# Patient Record
Sex: Female | Born: 1970 | Race: Black or African American | Hispanic: No | Marital: Single | Smoking: Current every day smoker
Health system: Southern US, Community
[De-identification: ages and names within clinical notes are randomized; demographics above are authoritative.]

## PROBLEM LIST (undated history)

## (undated) DIAGNOSIS — D649 Anemia, unspecified: Secondary | ICD-10-CM

## (undated) HISTORY — DX: Anemia, unspecified: D64.9

## (undated) HISTORY — PX: NO PAST SURGERIES: SHX2092

## (undated) HISTORY — PX: FOOT SURGERY: SHX648

---

## 2008-01-21 ENCOUNTER — Emergency Department (HOSPITAL_COMMUNITY): Admission: EM | Admit: 2008-01-21 | Discharge: 2008-01-22 | Payer: Self-pay | Admitting: Emergency Medicine

## 2008-07-13 ENCOUNTER — Emergency Department (HOSPITAL_COMMUNITY): Admission: EM | Admit: 2008-07-13 | Discharge: 2008-07-13 | Payer: Self-pay | Admitting: Emergency Medicine

## 2008-08-08 ENCOUNTER — Emergency Department (HOSPITAL_COMMUNITY): Admission: EM | Admit: 2008-08-08 | Discharge: 2008-08-08 | Payer: Self-pay | Admitting: Emergency Medicine

## 2008-08-14 ENCOUNTER — Emergency Department (HOSPITAL_COMMUNITY): Admission: EM | Admit: 2008-08-14 | Discharge: 2008-08-14 | Payer: Self-pay | Admitting: Emergency Medicine

## 2008-09-13 ENCOUNTER — Inpatient Hospital Stay (HOSPITAL_COMMUNITY): Admission: EM | Admit: 2008-09-13 | Discharge: 2008-09-17 | Payer: Self-pay | Admitting: Emergency Medicine

## 2008-09-13 ENCOUNTER — Ambulatory Visit: Payer: Self-pay | Admitting: *Deleted

## 2008-11-08 IMAGING — CR DG CHEST 2V
2 series · 2 of 2 positions shown · non-contrast
Comparison: Chest x-ray of 09/13/2008

CLINICAL DATA: Cough, cold, chills

CHEST - 2 VIEW

[w chest pa]
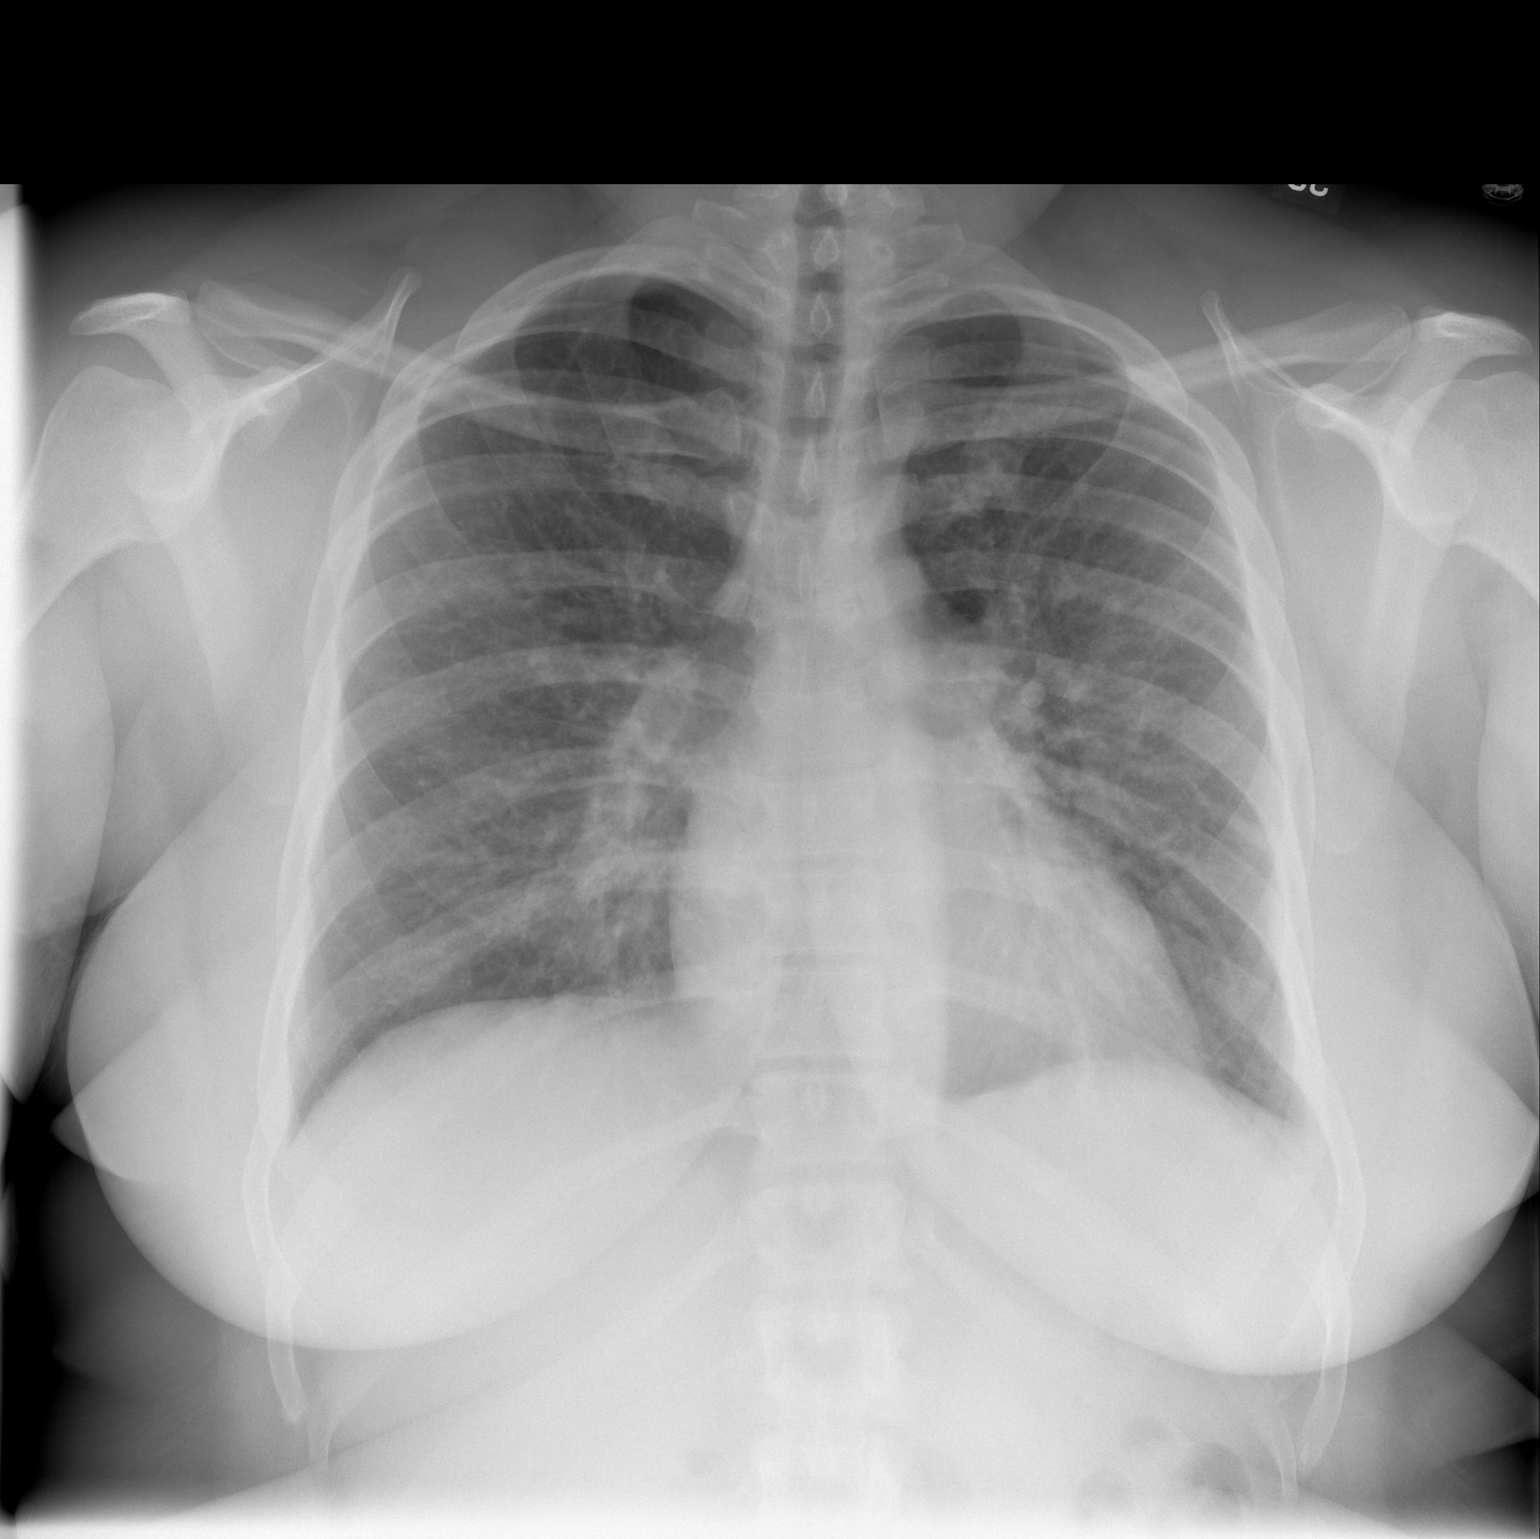

[w chest lat]
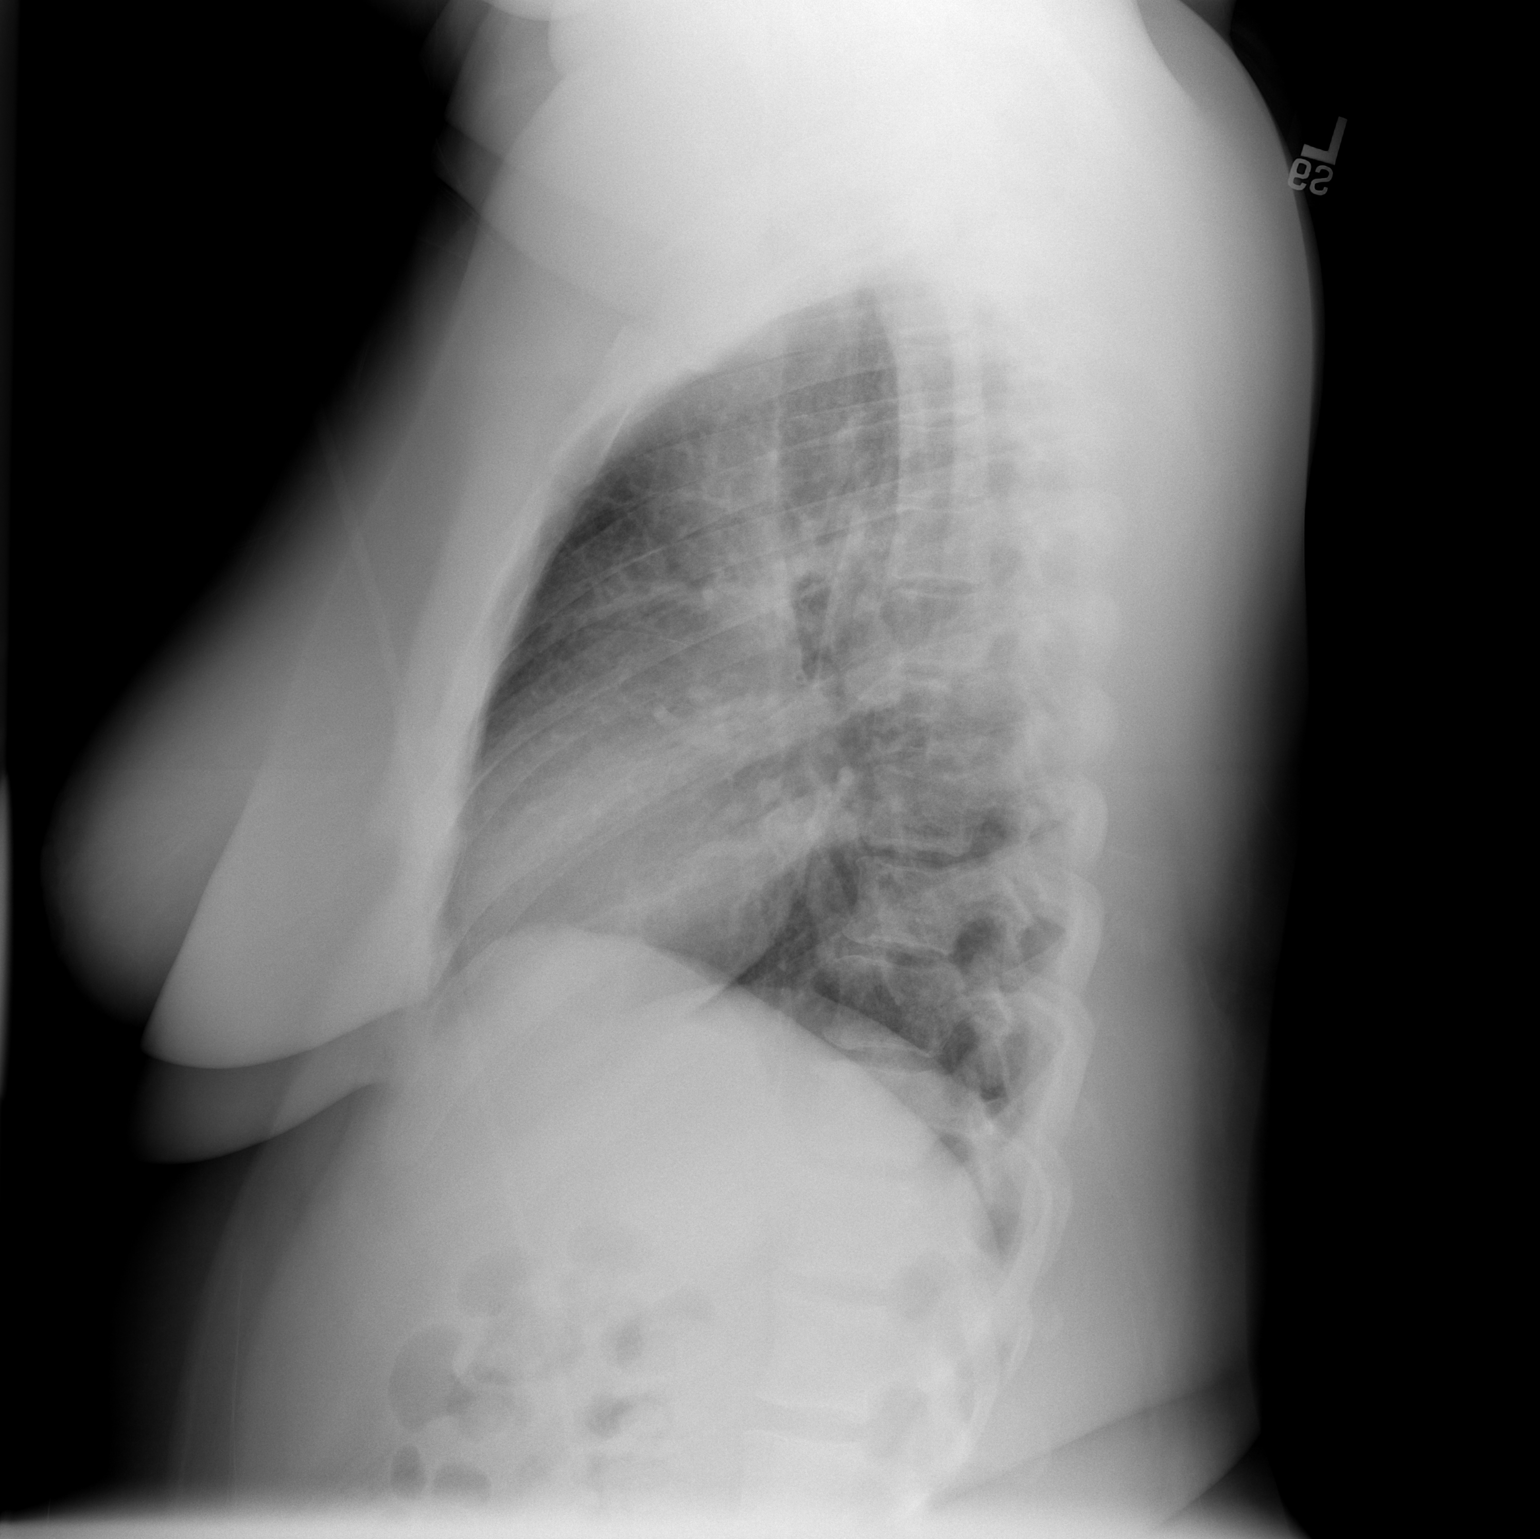

[2 of 2 positions shown; findings below may reference images not displayed]

FINDINGS: No definite focal pneumonia is seen.  However, there are
prominent perihilar markings, with some peribronchial thickening,
and bronchitis is a consideration.  The heart is within normal
limits in size.  No bony abnormality is seen.
IMPRESSION: No focal pneumonia.  Question bronchitis.

## 2008-11-29 ENCOUNTER — Emergency Department (HOSPITAL_COMMUNITY): Admission: EM | Admit: 2008-11-29 | Discharge: 2008-11-29 | Payer: Self-pay | Admitting: Emergency Medicine

## 2009-04-11 ENCOUNTER — Emergency Department (HOSPITAL_COMMUNITY): Admission: EM | Admit: 2009-04-11 | Discharge: 2009-04-11 | Payer: Self-pay | Admitting: Emergency Medicine

## 2009-12-20 ENCOUNTER — Emergency Department (HOSPITAL_COMMUNITY): Admission: EM | Admit: 2009-12-20 | Discharge: 2009-12-20 | Payer: Self-pay | Admitting: Emergency Medicine

## 2009-12-28 ENCOUNTER — Observation Stay (HOSPITAL_COMMUNITY): Admission: EM | Admit: 2009-12-28 | Discharge: 2009-12-29 | Payer: Self-pay | Admitting: Emergency Medicine

## 2010-01-01 ENCOUNTER — Emergency Department (HOSPITAL_COMMUNITY): Admission: EM | Admit: 2010-01-01 | Discharge: 2010-01-01 | Payer: Self-pay | Admitting: Emergency Medicine

## 2010-04-04 ENCOUNTER — Emergency Department (HOSPITAL_COMMUNITY): Admission: EM | Admit: 2010-04-04 | Discharge: 2010-04-04 | Payer: Self-pay | Admitting: Emergency Medicine

## 2011-02-17 LAB — BASIC METABOLIC PANEL
CO2: 23 mEq/L (ref 19–32)
Calcium: 8.5 mg/dL (ref 8.4–10.5)
Chloride: 103 mEq/L (ref 96–112)
Creatinine, Ser: 0.83 mg/dL (ref 0.4–1.2)
GFR calc Af Amer: >60 mL/min (ref >60)
Potassium: 3.7 mEq/L (ref 3.5–5.1)

## 2011-02-17 LAB — DIFFERENTIAL
Basophils Absolute: 0 10*3/uL (ref 0.0–0.1)
Eosinophils Relative: 0 % (ref 0–5)
Lymphocytes Relative: 11 % — ABNORMAL LOW (ref 12–46)
Lymphs Abs: 0.8 10*3/uL (ref 0.7–4.0)
Monocytes Absolute: 0.3 10*3/uL (ref 0.1–1.0)
Neutro Abs: 5.8 10*3/uL (ref 1.7–7.7)

## 2011-02-17 LAB — CBC
Hemoglobin: 11.4 g/dL — ABNORMAL LOW (ref 12.0–15.0)
RDW: 14.1 % (ref 11.5–15.5)
WBC: 6.9 10*3/uL (ref 4.0–10.5)

## 2011-03-12 LAB — RAPID STREP SCREEN (MED CTR MEBANE ONLY): Streptococcus, Group A Screen (Direct): NEGATIVE

## 2011-04-19 NOTE — Discharge Summary (Signed)
Jasmine Heath, Jasmine Heath NO.:  [id number]   MEDICAL RECORD NO.:  [id number]          PATIENT TYPE:  INP   LOCATION:  5014                         FACILITY:  [facility]   PHYSICIAN:  Manning Charity, MD     DATE OF BIRTH:  [birth year]   DATE OF ADMISSION:  09/13/2008  DATE OF DISCHARGE:  09/17/2008                               DISCHARGE SUMMARY   DISCHARGE DIAGNOSES:  1. Flu-like symptoms with right lower lobe pneumonia on chest x-ray.  2. Nausea, vomiting, diarrhea likely secondary to viral infection.  3. History of scabies 07/2008, treated with permethrin.  4. Hyperglycemia with a hemoglobin A1c of 6.3.  5. History of asthma.   MEDICATION AT DISCHARGE:  1. Tamiflu 275 mg p.o. b.i.d. for one more day.  2. Claritin 10 mg p.o. daily.  3. Avelox 40 mg p.o. daily x7 days.  4. Combivent inhaler inhale 2 puffs every 6 hours p.r.n.  5. Prednisone 20 mg p.o. the day after discharge, then 10 mg p.o. for      1 day, then 5 mg p.o. for 1 day, then stop.  6. Mucinex 600 mg p.o. b.i.d.  7. Triamcinolone 0.1% apply lotion on affected skin twice daily.   The patient has an appointment in the outpatient clinic, and she was  instructed to call the clinic with an appointment as she was discharged  over the weekend.  She was advised to follow a low-carbohydrate diet and  to increase activity slowly.   PROCEDURES:  She had a chest x-ray on admission that could not exclude a  cold right lower lobe pneumonia and then a subsequent chest x-ray 2 days  after admission showed no focal pneumonia, question bronchitis.   HISTORY OF PRESENT ILLNESS:  The patient is a 40 year old African  American female with history of asthma, history of scabies on August  2009, and homosexuality presented to the ED with fever, nausea,  vomiting, cough, chest pain, and shortness of breath.  She was last in  complete good health about 10 days prior to admission but then she  started getting allergy and flu-like  symptoms with stuffed nose,  breathing difficulty, and then advanced to fever reaching up to 103,  chills, nausea, vomiting, diarrhea, and chest pain worsened by coughing,  for full description, please see admission.   ALLERGIES:  No known drug allergies.   PHYSICAL EXAMINATION:  VITAL SIGNS: Temperature 102.9, blood pressure  92/50, pulse 118, respiratory rate 26, and oxygen saturation 92% on room  air.  GENERAL:  She was drowsy in no acute distress.  HEENT:  Eyes; EOMI, PERRLA, no icterus.  ENT: Congestive nose.  Red  postnasal drip.  NECK:  Supple.  No carotid bruit.  RESPIRATORY:  Diffuse wheezing decreased air entry.  Rhonchi but no  crackles.  CARDIOVASCULAR:  Regular rate and rhythm.  No murmurs, rubs, or gallops.  GI:  Soft, nontender known distended.  No guarding.  No rigidity.  EXTREMITIES:  Trace edema.  SKIN:  Rash over the skin, ichthyosis, scratch marks.  No scabies-like  lesion.  LYMPH:  No lymphadenopathy.  NEURO:  No focal deficits.  Cranial nerves II-XII intact.  Motor and  sensory normal.  PSYCH:  Normal affect.   LABORATORY DATA:  Sodium 138, potassium 3.9, chloride 103, bicarb 23,  BUN 5, creatinine 1.1, glucose 127.  White count 6.4 with neutrophil  percentage of 86% and an ANC of 5.5, hemoglobin 11.9, hematocrit 35.1,  and platelets 268.  Urine pregnancy test negative.  Urinalysis normal.  Coags normal with a mildly elevated PT of 15.3.  Hepatic function panel  normal except albumin of 3.3.  Lipase normal.  Cardiac enzymes normal x3  except elevated CK a around 500-600, which decreased towards discharge.  Cortisol 20.3.  HIV negative.  ESR 13, hemoglobin A1c 6.3%.  TSH 0.687,  free T4 0.92.  Fasting lipid profile significant only for an HDL of 22.  The rest was normal.  C. diff toxin was negative x1.  Legionella in  urine was negative.  Blood cultures are negative x2.  EKG was only  significant for possible left atrial enlargement and sinus tachycardia.    ASSESSMENT AND PLAN:  This is a 40 year old African American woman  presenting with fever, cough, congestion, nausea, vomiting, and diarrhea  for approximately a week but fevers starting the day prior to admission.  She had a sick contact in the son, who was diagnosed with flu, so this  was most likely influenza.  However, pneumonia could not be excluded  especially since she had a questionable right lower lobe infiltrate.  Another consideration was pseudoasthma, as the patient was wheezing, or  even full asthma attack.  We admitted her, started her on Rocephin and  Zithromax, and then transitioned to p.o. Avelox.  We also started her on  Tamiflu, placed her in droplet precautions, gave ibuprofen for muscle  aches and congestion, Tylenol for fever, breathing treatments, Afrin  nasal spray for nasal congestion, Tussionex for cough and Solu-Medrol  for wheezing.  Blood culture x2 were negative.  Sputum culture  unfortunately was inappropriate for analysis, Legionella antigen and  urine was negative.  Rapid strep test and influenza A and B were  negative.  The patient improved but continued to have wheezing  throughout the admission, but before discharge, her breathing became  better and her wheezing was significantly decreased.  We discharged her  on one more day of Tamiflu and 7 more days of Avelox and also on a  prednisone taper.  1. Nausea, vomiting, and diarrhea.  This was likely a component of the      initial viral infection.  She got Lomotil in the ED and her stool      number decreased during this hospitalization. We checked one stool      for Clostridium difficile, which was negative.  Lipase was      negative.  At discharge, she did not complain of nausea, vomiting      or diarrhea.  2. Eczema.  She had eczematous skin lesions for which we started her      on topical steroids and was going to discharged her on that.      History of scabies, she was on permethrin cream.  We checked  an      HIV, but this was nonreactive, and she was placed on contact      precautions.  3. Hyperglycemia.  This was likely secondary to steroid.  Hemoglobin      A1c was low.  4. Chest pain.  This was diffused likely secondary to coughing.  An  EKG was nonsignificant for ischemia and cardiac enzymes did not      show increased troponins.   VITAL SIGNS AT DISCHARGE:  Temperature 98.5, pulse 78, respiration rate  20, blood pressure 108/62, and oxygen saturation 97% on room air.   LABORATORY DATA:  A BMET Showed sodium 139, potassium 3.5, chloride 108,  bicarb 24, glucose 259, BUN 6, creatinine 0.68, and calcium 8.1.  CBC  showed white count of 10.6, hemoglobin 11.1, hematocrit 33.6, and  platelets 222.      Carlus Pavlov, M.D.  Electronically Signed      Manning Charity, MD  Electronically Signed    CG/MEDQ  D:  09/22/2008  T:  09/23/2008  Job:  [id number]

## 2011-05-09 ENCOUNTER — Emergency Department (HOSPITAL_COMMUNITY)
Admission: EM | Admit: 2011-05-09 | Discharge: 2011-05-09 | Disposition: A | Discharge status: Home / Self Care | Payer: Self-pay | Attending: Emergency Medicine | Admitting: Emergency Medicine

## 2011-05-09 ENCOUNTER — Emergency Department (HOSPITAL_COMMUNITY): Payer: Self-pay

## 2011-05-09 DIAGNOSIS — K5900 Constipation, unspecified: Secondary | ICD-10-CM | POA: Insufficient documentation

## 2011-05-09 DIAGNOSIS — R109 Unspecified abdominal pain: Secondary | ICD-10-CM | POA: Insufficient documentation

## 2011-05-09 DIAGNOSIS — J45909 Unspecified asthma, uncomplicated: Secondary | ICD-10-CM | POA: Insufficient documentation

## 2011-05-09 LAB — URINALYSIS, ROUTINE W REFLEX MICROSCOPIC
Glucose, UA: NEGATIVE mg/dL
Protein, ur: NEGATIVE mg/dL
Specific Gravity, Urine: 1.025 (ref 1.005–1.030)
Urobilinogen, UA: 0.2 mg/dL (ref 0.0–1.0)

## 2011-05-09 LAB — CBC
HCT: 34.8 % — ABNORMAL LOW (ref 36.0–46.0)
Hemoglobin: 11.7 g/dL — ABNORMAL LOW (ref 12.0–15.0)
RBC: 3.94 MIL/uL (ref 3.87–5.11)

## 2011-05-09 LAB — URINE MICROSCOPIC-ADD ON

## 2011-05-09 LAB — DIFFERENTIAL
Basophils Absolute: 0 10*3/uL (ref 0.0–0.1)
Basophils Relative: 0 % (ref 0–1)
Eosinophils Absolute: 0.2 10*3/uL (ref 0.0–0.7)
Eosinophils Relative: 2 % (ref 0–5)
Monocytes Absolute: 0.6 10*3/uL (ref 0.1–1.0)
Neutro Abs: 4.5 10*3/uL (ref 1.7–7.7)
Neutrophils Relative %: 51 % (ref 43–77)

## 2011-05-09 LAB — COMPREHENSIVE METABOLIC PANEL
AST: 13 U/L (ref 0–37)
Albumin: 3.1 g/dL — ABNORMAL LOW (ref 3.5–5.2)
Alkaline Phosphatase: 52 U/L (ref 39–117)
BUN: 12 mg/dL (ref 6–23)
CO2: 28 mEq/L (ref 19–32)
Chloride: 107 mEq/L (ref 96–112)
Creatinine, Ser: 0.79 mg/dL (ref 0.4–1.2)
GFR calc non Af Amer: >60 mL/min (ref >60)
Potassium: 3.9 mEq/L (ref 3.5–5.1)
Total Bilirubin: 0.2 mg/dL — ABNORMAL LOW (ref 0.3–1.2)

## 2011-05-09 LAB — LIPASE, BLOOD: Lipase: 26 U/L (ref 11–59)

## 2011-06-12 ENCOUNTER — Emergency Department (HOSPITAL_COMMUNITY)
Admission: EM | Admit: 2011-06-12 | Discharge: 2011-06-12 | Disposition: A | Discharge status: Home / Self Care | Payer: Self-pay | Attending: Emergency Medicine | Admitting: Emergency Medicine

## 2011-06-12 DIAGNOSIS — J45901 Unspecified asthma with (acute) exacerbation: Secondary | ICD-10-CM | POA: Insufficient documentation

## 2011-08-22 ENCOUNTER — Emergency Department (HOSPITAL_COMMUNITY)
Admission: EM | Admit: 2011-08-22 | Discharge: 2011-08-22 | Disposition: A | Discharge status: Home / Self Care | Payer: Self-pay | Attending: Emergency Medicine | Admitting: Emergency Medicine

## 2011-08-22 DIAGNOSIS — J45909 Unspecified asthma, uncomplicated: Secondary | ICD-10-CM | POA: Insufficient documentation

## 2011-08-22 DIAGNOSIS — R05 Cough: Secondary | ICD-10-CM | POA: Insufficient documentation

## 2011-08-22 DIAGNOSIS — R059 Cough, unspecified: Secondary | ICD-10-CM | POA: Insufficient documentation

## 2011-08-26 LAB — POCT CARDIAC MARKERS
Myoglobin, poc: 59.2
Operator id: 4661

## 2011-08-26 LAB — INFLUENZA A+B VIRUS AG-DIRECT(RAPID)
Inflenza A Ag: NEGATIVE
Influenza B Ag: NEGATIVE

## 2011-08-26 LAB — D-DIMER, QUANTITATIVE: D-Dimer, Quant: 0.3

## 2011-09-03 LAB — EXPECTORATED SPUTUM ASSESSMENT W GRAM STAIN, RFLX TO RESP C

## 2011-09-03 LAB — POCT I-STAT, CHEM 8
Chloride: 103
HCT: 39
Hemoglobin: 13.3
Potassium: 3.9

## 2011-09-03 LAB — CULTURE, BLOOD (ROUTINE X 2)

## 2011-09-03 LAB — BASIC METABOLIC PANEL
BUN: 6
BUN: 6
BUN: 8
BUN: 8
CO2: 23
CO2: 24
Calcium: 8 — ABNORMAL LOW
Calcium: 8.1 — ABNORMAL LOW
Calcium: 8.4
Chloride: 105
Chloride: 108
Creatinine, Ser: 0.69
Creatinine, Ser: 0.7
Creatinine, Ser: 0.73
GFR calc Af Amer: >60
GFR calc Af Amer: >60
GFR calc non Af Amer: >60
GFR calc non Af Amer: >60
Glucose, Bld: 259 — ABNORMAL HIGH
Glucose, Bld: 363 — ABNORMAL HIGH
Potassium: 4.1

## 2011-09-03 LAB — CBC
HCT: 33.3 — ABNORMAL LOW
HCT: 35.1 — ABNORMAL LOW
HCT: 35.8 — ABNORMAL LOW
Hemoglobin: 11.3 — ABNORMAL LOW
Hemoglobin: 11.9 — ABNORMAL LOW
MCHC: 32.9
MCHC: 33
MCHC: 33.3
MCHC: 33.8
MCV: 90.4
MCV: 90.6
MCV: 91.3
Platelets: 222
Platelets: 251
RBC: 3.68 — ABNORMAL LOW
RBC: 3.89
RDW: 14.3
RDW: 14.3
WBC: 6.4
WBC: 6.7
WBC: 8.4
WBC: 9.8

## 2011-09-03 LAB — LIPID PANEL
HDL: 22 — ABNORMAL LOW
LDL Cholesterol: 52
Total CHOL/HDL Ratio: 3.8
Triglycerides: 45
VLDL: 9

## 2011-09-03 LAB — DIFFERENTIAL
Basophils Absolute: 0
Basophils Relative: 0
Eosinophils Absolute: 0
Neutro Abs: 5.5
Neutrophils Relative %: 86 — ABNORMAL HIGH

## 2011-09-03 LAB — LEGIONELLA ANTIGEN, URINE: Legionella Antigen, Urine: NEGATIVE

## 2011-09-03 LAB — CARDIAC PANEL(CRET KIN+CKTOT+MB+TROPI)
CK, MB: 3.4
Relative Index: 0.8
Relative Index: 0.8
Total CK: 435 — ABNORMAL HIGH
Total CK: 629 — ABNORMAL HIGH
Troponin I: 0.05

## 2011-09-03 LAB — URINALYSIS, ROUTINE W REFLEX MICROSCOPIC
Ketones, ur: NEGATIVE
Nitrite: NEGATIVE
Specific Gravity, Urine: 1.019
Urobilinogen, UA: 1
pH: 7

## 2011-09-03 LAB — HEPATIC FUNCTION PANEL
ALT: 20
Alkaline Phosphatase: 53
Bilirubin, Direct: <0.1
Total Protein: 6.3

## 2011-09-03 LAB — HIV ANTIBODY (ROUTINE TESTING W REFLEX): HIV: NONREACTIVE

## 2011-09-03 LAB — CLOSTRIDIUM DIFFICILE EIA

## 2011-09-03 LAB — RAPID STREP SCREEN (MED CTR MEBANE ONLY): Streptococcus, Group A Screen (Direct): NEGATIVE

## 2011-09-03 LAB — PROTIME-INR: Prothrombin Time: 15.3 — ABNORMAL HIGH

## 2011-09-03 LAB — TSH: TSH: 0.687

## 2011-09-03 LAB — HEMOGLOBIN A1C: Hgb A1c MFr Bld: 6.3 — ABNORMAL HIGH

## 2011-11-15 ENCOUNTER — Emergency Department (HOSPITAL_COMMUNITY)
Admission: EM | Admit: 2011-11-15 | Discharge: 2011-11-15 | Disposition: A | Discharge status: Home / Self Care | Payer: Self-pay | Attending: Emergency Medicine | Admitting: Emergency Medicine

## 2011-11-15 ENCOUNTER — Encounter: Payer: Self-pay | Admitting: Emergency Medicine

## 2011-11-15 ENCOUNTER — Emergency Department (HOSPITAL_COMMUNITY): Payer: Self-pay

## 2011-11-15 DIAGNOSIS — R070 Pain in throat: Secondary | ICD-10-CM | POA: Insufficient documentation

## 2011-11-15 DIAGNOSIS — R0602 Shortness of breath: Secondary | ICD-10-CM | POA: Insufficient documentation

## 2011-11-15 DIAGNOSIS — R059 Cough, unspecified: Secondary | ICD-10-CM | POA: Insufficient documentation

## 2011-11-15 DIAGNOSIS — R05 Cough: Secondary | ICD-10-CM | POA: Insufficient documentation

## 2011-11-15 DIAGNOSIS — J40 Bronchitis, not specified as acute or chronic: Secondary | ICD-10-CM | POA: Insufficient documentation

## 2011-11-15 DIAGNOSIS — IMO0001 Reserved for inherently not codable concepts without codable children: Secondary | ICD-10-CM | POA: Insufficient documentation

## 2011-11-15 DIAGNOSIS — F17200 Nicotine dependence, unspecified, uncomplicated: Secondary | ICD-10-CM | POA: Insufficient documentation

## 2011-11-15 DIAGNOSIS — J45901 Unspecified asthma with (acute) exacerbation: Secondary | ICD-10-CM | POA: Insufficient documentation

## 2011-11-15 LAB — POCT PREGNANCY, URINE: Preg Test, Ur: NEGATIVE

## 2011-11-15 MED ORDER — DOXYCYCLINE HYCLATE 100 MG PO CAPS: 100.0000 mg | ORAL_CAPSULE | Freq: Two times a day (BID) | ORAL | Status: AC

## 2011-11-15 MED ORDER — IPRATROPIUM BROMIDE 0.02 % IN SOLN
0.5000 mg | Freq: Once | RESPIRATORY_TRACT | Status: AC
  Administered 2011-11-15: 0.5 mg via RESPIRATORY_TRACT
  Filled 2011-11-15: qty 2.5

## 2011-11-15 MED ORDER — ALBUTEROL SULFATE HFA 108 (90 BASE) MCG/ACT IN AERS: 1.0000 | INHALATION_SPRAY | Freq: Four times a day (QID) | RESPIRATORY_TRACT | Status: DC | PRN

## 2011-11-15 MED ORDER — PREDNISONE 20 MG PO TABS
60.0000 mg | ORAL_TABLET | Freq: Once | ORAL | Status: AC
  Administered 2011-11-15: 60 mg via ORAL
  Filled 2011-11-15: qty 3

## 2011-11-15 MED ORDER — PREDNISONE 50 MG PO TABS: 50.0000 mg | ORAL_TABLET | Freq: Every day | ORAL | Status: DC

## 2011-11-15 MED ORDER — ALBUTEROL SULFATE (5 MG/ML) 0.5% IN NEBU
2.5000 mg | INHALATION_SOLUTION | Freq: Once | RESPIRATORY_TRACT | Status: AC
  Administered 2011-11-15: 2.5 mg via RESPIRATORY_TRACT
  Filled 2011-11-15: qty 0.5

## 2011-11-15 NOTE — ED Provider Notes (Signed)
History     CSN: [id number] Arrival date & time: 11/15/2011  2:11 AM   First MD Initiated Contact with Patient 11/15/11 0231      Chief Complaint  Patient presents with  . Shortness of Breath    (Consider location/radiation/quality/duration/timing/severity/associated sxs/prior treatment) Patient is a 40 y.o. female presenting with wheezing. The history is provided by the patient. No language interpreter was used.  Wheezing  The current episode started 3 to 5 days ago. The onset was gradual. The problem occurs continuously. The problem has been unchanged. The problem is moderate. The symptoms are relieved by nothing. The symptoms are aggravated by nothing. Associated symptoms include cough and wheezing. Pertinent negatives include no chest pain, no fever, no rhinorrhea, no sore throat and no stridor. There was no intake of a foreign body. She has had intermittent steroid use. She has had prior hospitalizations. She has had prior ICU admissions. She has had no prior intubations. Her past medical history is significant for asthma. She has been behaving normally. Urine output has been normal. The last void occurred less than 6 hours ago. There were sick contacts at work. She has received no recent medical care.  typical asthma exacerbation.  No f/c/r.  No CP.  No swelling of the lower extremities.  No long car trips or plane trips.  PERC negative.   Past Medical History  Diagnosis Date  . Asthma     History reviewed. No pertinent past surgical history.  No family history on file.  History  Substance Use Topics  . Smoking status: Current Everyday Smoker  . Smokeless tobacco: Not on file  . Alcohol Use: Yes    OB History    Grav Para Term Preterm Abortions TAB SAB Ect Mult Living                  Review of Systems  Constitutional: Negative for fever.  HENT: Negative for sore throat and rhinorrhea.   Respiratory: Positive for cough and wheezing. Negative for stridor.     Cardiovascular: Negative for chest pain.  Gastrointestinal: Negative for abdominal distention.  Genitourinary: Negative for difficulty urinating.  Musculoskeletal: Negative for arthralgias.  Neurological: Negative for dizziness.  Hematological: Negative.   Psychiatric/Behavioral: Negative.     Allergies  Review of patient's allergies indicates no known allergies.  Home Medications  No current outpatient prescriptions on file.  BP 135/87  Pulse 98  Temp(Src) 98.9 F (37.2 C) (Oral)  Resp 18  SpO2 96%  LMP 11/13/2011  Physical Exam  Constitutional: She is oriented to person, place, and time. She appears well-developed and well-nourished.  HENT:  Head: Normocephalic and atraumatic.  Mouth/Throat: Oropharynx is clear and moist.  Eyes: EOM are normal. Pupils are equal, round, and reactive to light.  Neck: Normal range of motion. Neck supple.  Cardiovascular: Normal rate and regular rhythm.   Pulmonary/Chest: She has wheezes.  Abdominal: Soft. Bowel sounds are normal. There is no tenderness. There is no rebound and no guarding.  Musculoskeletal: Normal range of motion.  Neurological: She is alert and oriented to person, place, and time.  Skin: Skin is warm and dry. She is not diaphoretic.  Psychiatric: She has a normal mood and affect.    ED Course  Procedures (including critical care time)   Labs Reviewed  POCT PREGNANCY, URINE   Dg Chest 2 View  11/15/2011  *RADIOLOGY REPORT*  Clinical Data: Wheezing, cough, sore throat, body aches.  CHEST - 2 VIEW  Comparison: 04/04/2010  Findings: Borderline heart size with normal pulmonary vascularity. No focal airspace consolidation in the lungs.  No blunting of costophrenic angles.  No pneumothorax.  Mild peribronchial thickening suggesting chronic bronchitis.  No significant change since previous study.  IMPRESSION: Chronic bronchitic changes in the lungs.  No evidence of active pulmonary disease.  Original Report Authenticated By:  Marlon Pel, M.D.     No diagnosis found.    MDM   Return for chest pain, shortness of breath palpitations, swelling in the lower extremities or any concerns.  Patient verbalizes understanding and agrees to follow up       Kasumi Ditullio Smitty Cords, MD 11/15/11 [phone]

## 2011-11-15 NOTE — ED Notes (Signed)
PT. REPORTS PROGRESSING SOB WITH PRODUCTIVE COUGH FOR 2 DAYS WITH CHILLS.

## 2011-11-20 ENCOUNTER — Encounter (HOSPITAL_COMMUNITY): Payer: Self-pay | Admitting: Emergency Medicine

## 2011-11-20 ENCOUNTER — Other Ambulatory Visit: Payer: Self-pay

## 2011-11-20 ENCOUNTER — Emergency Department (HOSPITAL_COMMUNITY)
Admission: EM | Admit: 2011-11-20 | Discharge: 2011-11-21 | Disposition: A | Discharge status: Home / Self Care | Payer: Self-pay | Attending: Emergency Medicine | Admitting: Emergency Medicine

## 2011-11-20 DIAGNOSIS — M25559 Pain in unspecified hip: Secondary | ICD-10-CM | POA: Insufficient documentation

## 2011-11-20 DIAGNOSIS — M5430 Sciatica, unspecified side: Secondary | ICD-10-CM | POA: Insufficient documentation

## 2011-11-20 DIAGNOSIS — M79609 Pain in unspecified limb: Secondary | ICD-10-CM | POA: Insufficient documentation

## 2011-11-20 DIAGNOSIS — R55 Syncope and collapse: Secondary | ICD-10-CM | POA: Insufficient documentation

## 2011-11-20 DIAGNOSIS — J45909 Unspecified asthma, uncomplicated: Secondary | ICD-10-CM | POA: Insufficient documentation

## 2011-11-20 NOTE — ED Notes (Signed)
Pt c/o legs feeling weak for approx 2 weeks.  St's she can't work because she keeps "falling out".   Was recently seen here for asthma

## 2011-11-21 ENCOUNTER — Emergency Department (HOSPITAL_COMMUNITY): Payer: Self-pay

## 2011-11-21 LAB — CBC
HCT: 38.1 % (ref 36.0–46.0)
MCHC: 33.3 g/dL (ref 30.0–36.0)
MCV: 89.2 fL (ref 78.0–100.0)
Platelets: 299 10*3/uL (ref 150–400)
RDW: 13 % (ref 11.5–15.5)
WBC: 9.6 10*3/uL (ref 4.0–10.5)

## 2011-11-21 LAB — DIFFERENTIAL
Basophils Absolute: 0 10*3/uL (ref 0.0–0.1)
Basophils Relative: 0 % (ref 0–1)
Eosinophils Relative: 2 % (ref 0–5)
Lymphocytes Relative: 49 % — ABNORMAL HIGH (ref 12–46)
Monocytes Absolute: 0.4 10*3/uL (ref 0.1–1.0)

## 2011-11-21 LAB — POCT I-STAT, CHEM 8
BUN: 8 mg/dL (ref 6–23)
Creatinine, Ser: 0.8 mg/dL (ref 0.50–1.10)
Potassium: 3.3 mEq/L — ABNORMAL LOW (ref 3.5–5.1)
Sodium: 144 mEq/L (ref 135–145)

## 2011-11-21 LAB — POCT I-STAT TROPONIN I

## 2011-11-21 LAB — D-DIMER, QUANTITATIVE: D-Dimer, Quant: 0.35 ug/mL-FEU (ref 0.00–0.48)

## 2011-11-21 MED ORDER — KETOROLAC TROMETHAMINE 60 MG/2ML IM SOLN
60.0000 mg | Freq: Once | INTRAMUSCULAR | Status: AC
  Administered 2011-11-21: 60 mg via INTRAMUSCULAR
  Filled 2011-11-21: qty 2

## 2011-11-21 MED ORDER — TRAMADOL HCL 50 MG PO TABS
50.0000 mg | ORAL_TABLET | Freq: Once | ORAL | Status: AC
  Administered 2011-11-21: 50 mg via ORAL
  Filled 2011-11-21: qty 1

## 2011-11-21 MED ORDER — DEXAMETHASONE SODIUM PHOSPHATE 4 MG/ML IJ SOLN
8.0000 mg | Freq: Once | INTRAMUSCULAR | Status: AC
  Administered 2011-11-21: 8 mg via INTRAMUSCULAR
  Filled 2011-11-21 (×2): qty 1

## 2011-11-21 MED ORDER — METHOCARBAMOL 500 MG PO TABS
500.0000 mg | ORAL_TABLET | Freq: Once | ORAL | Status: DC
Start: 2011-11-21 — End: 2011-11-21

## 2011-11-21 MED ORDER — POTASSIUM CHLORIDE CRYS ER 20 MEQ PO TBCR
40.0000 meq | EXTENDED_RELEASE_TABLET | Freq: Once | ORAL | Status: AC
  Administered 2011-11-21: 40 meq via ORAL
  Filled 2011-11-21 (×2): qty 1

## 2011-11-21 MED ORDER — OXYCODONE-ACETAMINOPHEN 5-325 MG PO TABS: 1.0000 | ORAL_TABLET | Freq: Four times a day (QID) | ORAL | Status: AC | PRN

## 2011-11-21 NOTE — ED Notes (Signed)
Pt back from radiology, ermd at bedside

## 2011-11-21 NOTE — ED Notes (Signed)
Pt visiting with friend at bedside discussing "smoking weed" pt cursing and speaking loudly. Informed pt that there are other pts and the conversation can be heard clearly elsewhere.

## 2011-11-21 NOTE — ED Provider Notes (Addendum)
History     CSN: [id number] Arrival date & time: 11/20/2011 11:33 PM   First MD Initiated Contact with Patient 11/21/11 0101      Chief Complaint  Patient presents with  . Near Syncope    (Consider location/radiation/quality/duration/timing/severity/associated sxs/prior treatment) Patient is a 40 y.o. female presenting with leg pain. The history is provided by the patient. No language interpreter was used.  Leg Pain  The incident occurred more than 1 week ago. The incident occurred at home. There was no injury mechanism. The pain is present in the left hip, left thigh, left leg, right hip and right thigh. The quality of the pain is described as sharp and burning. The pain is at a severity of 9/10. The pain is severe. The pain has been constant since onset. Pertinent negatives include no numbness, no inability to bear weight, no loss of motion, no muscle weakness, no loss of sensation and no tingling. She reports no foreign bodies present. The symptoms are aggravated by activity. She has tried nothing for the symptoms. The treatment provided no relief.  Pain is so bad it makes her feel like she is going to pass out when she walks.  No CP, no SOB no DOE,  Has not actually passed out.  No weakness nor numbness. No trauma.  No bowel or bladder incontinence.  No back pain no f/c/r. No weight loss no abdominal pain no instrumentation  Past Medical History  Diagnosis Date  . Asthma     History reviewed. No pertinent past surgical history.  No family history on file.  History  Substance Use Topics  . Smoking status: Current Everyday Smoker  . Smokeless tobacco: Not on file  . Alcohol Use: Yes    OB History    Grav Para Term Preterm Abortions TAB SAB Ect Mult Living                  Review of Systems  Constitutional: Negative for fever, diaphoresis and fatigue.  HENT: Negative for neck pain and neck stiffness.   Eyes: Negative for discharge.  Respiratory: Negative for apnea and  choking.   Cardiovascular: Negative for chest pain.  Gastrointestinal: Negative for abdominal distention.  Genitourinary: Negative for difficulty urinating and pelvic pain.  Musculoskeletal: Negative for back pain, joint swelling and gait problem.  Neurological: Negative for dizziness, tingling, tremors, seizures, syncope, speech difficulty and numbness.  Hematological: Negative.   Psychiatric/Behavioral: Negative.     Allergies  Review of patient's allergies indicates no known allergies.  Home Medications   Current Outpatient Rx  Name Route Sig Dispense Refill  . ACETAMINOPHEN-CODEINE #3 300-30 MG PO TABS Oral Take 1 tablet by mouth every 4 (four) hours as needed. Joint pain     . ALBUTEROL SULFATE HFA 108 (90 BASE) MCG/ACT IN AERS Inhalation Inhale 1-2 puffs into the lungs every 6 (six) hours as needed for wheezing. 1 Inhaler 0  . ALBUTEROL SULFATE (2.5 MG/3ML) 0.083% IN NEBU Nebulization Take 2.5 mg by nebulization every 6 (six) hours as needed. wheezing     . DOXYCYCLINE HYCLATE 100 MG PO CAPS Oral Take 1 capsule (100 mg total) by mouth 2 (two) times daily. 14 capsule 0  . THERAGRAN-M PO TABS Oral Take 1 tablet by mouth daily.      Marland Kitchen PREDNISONE 50 MG PO TABS Oral Take 1 tablet (50 mg total) by mouth daily. 5 tablet 0    BP 121/82  Pulse 66  Temp(Src) 97 F (36.1 C) (Oral)  Resp 16  SpO2 98%  LMP 11/11/2011  Physical Exam  Constitutional: She is oriented to person, place, and time. She appears well-developed and well-nourished. No distress.  HENT:  Head: Normocephalic and atraumatic.  Mouth/Throat: Oropharynx is clear and moist.  Eyes: Conjunctivae and EOM are normal. Pupils are equal, round, and reactive to light.  Neck: Normal range of motion. Neck supple.  Cardiovascular: Normal rate and regular rhythm.   Pulmonary/Chest: Effort normal and breath sounds normal. No stridor. She has no wheezes. She has no rales. She exhibits no tenderness.  Abdominal: Soft. Bowel sounds  are normal. There is no tenderness. There is no rebound and no guarding.  Musculoskeletal: Normal range of motion. She exhibits no edema.  Neurological: She is alert and oriented to person, place, and time. She has normal strength and normal reflexes. No sensory deficit. Gait normal.  Reflex Scores:      Tricep reflexes are 2+ on the right side and 2+ on the left side.      Bicep reflexes are 2+ on the right side and 2+ on the left side.      Brachioradialis reflexes are 2+ on the right side and 2+ on the left side.      Patellar reflexes are 2+ on the right side and 2+ on the left side.      Achilles reflexes are 2+ on the right side and 2+ on the left side. Skin: Skin is warm and dry.  Psychiatric: Thought content normal.    ED Course  Procedures (including critical care time)  Labs Reviewed  DIFFERENTIAL - Abnormal; Notable for the following:    Lymphocytes Relative 49 (*)    Lymphs Abs 4.7 (*)    All other components within normal limits  POCT I-STAT, CHEM 8 - Abnormal; Notable for the following:    Potassium 3.3 (*)    Glucose, Bld 149 (*)    All other components within normal limits  CBC  D-DIMER, QUANTITATIVE  POCT PREGNANCY, URINE  POCT I-STAT TROPONIN I  I-STAT, CHEM 8  I-STAT TROPONIN I  POCT PREGNANCY, URINE   Dg Chest 2 View  11/21/2011  *RADIOLOGY REPORT*  Clinical Data: Leg pain, asthma history.  CHEST - 2 VIEW  Comparison: 11/15/2011  Findings: Cardiomediastinal contours are within normal limits.  No focal consolidation.  No pleural effusion or pneumothorax.  Stable mild chronic bronchitic changes.  No acute osseous abnormality.  IMPRESSION: Mild chronic bronchitic changes without focal consolidation.  Original Report Authenticated By: Waneta Martins, M.D.     No diagnosis found.    Date: 11/21/2011  Rate:75  Rhythm: sinus arrhythmia  QRS Axis: normal  Intervals: normal  ST/T Wave abnormalities: normal  Conduction Disutrbances:none  Narrative  Interpretation:   Old EKG Reviewed: none available   MDM  All findings reassuring likely sciatica, follow up with your family doctor for ongoing care return for weakness or numbness. Patient verbalizes understanding and agrees to follow up        Cassandra Mcmanaman K Junnie Loschiavo-Rasch, MD 11/21/11 [id number]  Jasmine Awe, MD 11/21/11 [phone]

## 2011-11-21 NOTE — ED Notes (Signed)
Report given to David, RN.

## 2011-11-21 NOTE — ED Notes (Signed)
Pt states she is able to stress her legs out and pain has decreased to 3/10.

## 2012-05-05 ENCOUNTER — Encounter (HOSPITAL_COMMUNITY): Payer: Self-pay | Admitting: *Deleted

## 2012-05-05 ENCOUNTER — Emergency Department (HOSPITAL_COMMUNITY)
Admission: EM | Admit: 2012-05-05 | Discharge: 2012-05-05 | Disposition: A | Discharge status: Home / Self Care | Payer: Self-pay | Attending: Emergency Medicine | Admitting: Emergency Medicine

## 2012-05-05 ENCOUNTER — Emergency Department (HOSPITAL_COMMUNITY): Payer: Self-pay

## 2012-05-05 DIAGNOSIS — F17200 Nicotine dependence, unspecified, uncomplicated: Secondary | ICD-10-CM | POA: Insufficient documentation

## 2012-05-05 DIAGNOSIS — J45901 Unspecified asthma with (acute) exacerbation: Secondary | ICD-10-CM | POA: Insufficient documentation

## 2012-05-05 MED ORDER — PREDNISONE 20 MG PO TABS
60.0000 mg | ORAL_TABLET | Freq: Once | ORAL | Status: AC
  Administered 2012-05-05: 60 mg via ORAL
  Filled 2012-05-05: qty 3

## 2012-05-05 MED ORDER — ALBUTEROL SULFATE (5 MG/ML) 0.5% IN NEBU
5.0000 mg | INHALATION_SOLUTION | Freq: Once | RESPIRATORY_TRACT | Status: AC
  Administered 2012-05-05: 5 mg via RESPIRATORY_TRACT
  Filled 2012-05-05: qty 1

## 2012-05-05 MED ORDER — IPRATROPIUM BROMIDE 0.02 % IN SOLN
0.5000 mg | Freq: Once | RESPIRATORY_TRACT | Status: AC
  Administered 2012-05-05: 0.5 mg via RESPIRATORY_TRACT
  Filled 2012-05-05: qty 2.5

## 2012-05-05 MED ORDER — ALBUTEROL SULFATE HFA 108 (90 BASE) MCG/ACT IN AERS: 2.0000 | INHALATION_SPRAY | RESPIRATORY_TRACT | Status: DC | PRN

## 2012-05-05 MED ORDER — PREDNISONE (PAK) 10 MG PO TABS: 10.0000 mg | ORAL_TABLET | Freq: Every day | ORAL | Status: AC

## 2012-05-05 NOTE — ED Provider Notes (Signed)
History     CSN: [id number]  Arrival date & time 05/05/12  4098   First MD Initiated Contact with Patient 05/05/12 2042      Chief Complaint  Patient presents with  . Shortness of Breath    (Consider location/radiation/quality/duration/timing/severity/associated sxs/prior treatment) HPI Comments: Patient reports she has had increased shortness of breath, tightness in her chest, cough x 3 weeks.  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  Patient had a breathing treatment while in triage and states that she feels much better, declines any further treatments.  States she feels well enough to go home.  Denies fevers.  Is using inhalers without relief. States her inhalers are not expired.    Patient is a 41 y.o. female presenting with shortness of breath. The history is provided by the patient.  Shortness of Breath  Associated symptoms include cough and shortness of breath. Pertinent negatives include no fever.    Past Medical History  Diagnosis Date  . Asthma     History reviewed. No pertinent past surgical history.  No family history on file.  History  Substance Use Topics  . Smoking status: Current Everyday Smoker  . Smokeless tobacco: Not on file  . Alcohol Use: Yes    OB History    Grav Para Term Preterm Abortions TAB SAB Ect Mult Living                  Review of Systems  Constitutional: Negative for fever and chills.  Respiratory: Positive for cough, chest tightness and shortness of breath.     Allergies  Review of patient's allergies indicates no known allergies.  Home Medications   Current Outpatient Rx  Name Route Sig Dispense Refill  . ACETAMINOPHEN-CODEINE #3 300-30 MG PO TABS Oral Take 1 tablet by mouth every 4 (four) hours as needed. Joint pain     . ALBUTEROL SULFATE HFA 108 (90 BASE) MCG/ACT IN AERS Inhalation Inhale 1-2 puffs into the lungs every 6 (six) hours as needed for  wheezing. 1 Inhaler 0  . ALBUTEROL SULFATE (2.5 MG/3ML) 0.083% IN NEBU Nebulization Take 2.5 mg by nebulization every 6 (six) hours as needed. wheezing     . THERAGRAN-M PO TABS Oral Take 1 tablet by mouth daily.      Marland Kitchen PREDNISONE 50 MG PO TABS Oral Take 1 tablet (50 mg total) by mouth daily. 5 tablet 0    BP 137/81  Pulse 79  Temp(Src) 97.7 F (36.5 C) (Oral)  Resp 18  SpO2 96%  LMP 05/05/2012  Physical Exam  Nursing note and vitals reviewed. Constitutional: She is oriented to person, place, and time. She appears well-developed and well-nourished. No distress.  HENT:  Head: Normocephalic and atraumatic.  Neck: Neck supple.  Cardiovascular: Normal rate and regular rhythm.   Pulmonary/Chest: Effort normal and breath sounds normal. No respiratory distress. She has no decreased breath sounds. She has no wheezes. She has no rhonchi. She has no rales.  Neurological: She is alert and oriented to person, place, and time.  Skin: She is not diaphoretic.    ED Course  Procedures (including critical care time)  Labs Reviewed - No data to display Dg Chest 2 View  05/05/2012  *RADIOLOGY REPORT*  Clinical Data: Shortness of breath.  Chest pain.  CHEST - 2 VIEW  Comparison: Chest x-ray 11/21/2011.  Findings: Lung volumes are normal.  No consolidative airspace disease.  No pleural effusions.  Pulmonary vasculature is normal. Mild diffuse interstitial prominence is noted, with diffuse peribronchial cuffing.  Heart size and mediastinal contours are within normal limits.  IMPRESSION: 1.  Diffuse interstitial prominence and peribronchial cuffing similar to prior examinations.  This likely reflects chronic bronchitis, but can also be seen in the setting of reactive airway disease (although lung volumes are normal) or acute bronchitis. Clinical correlation is recommended.  Original Report Authenticated By: Florencia Reasons, M.D.     1. Asthma exacerbation       MDM  Patient with hx asthma feeling  much better after one neb treatment, declined further treatments.  Symptoms seem to be exacerbated by hot unventilated working space - I have written note for work.  Pt d/c home with albuterol, prednisone.  Return precautions given.  Patient verbalizes understanding and agrees with plan.          Dillard Cannon [place], [place] 05/05/12 [phone]

## 2012-05-05 NOTE — Discharge Instructions (Signed)
Read the information below.  Please use the medications as prescribed.  It is important for your health that you stop smoking.  If you develop worsening shortness of breath, wheezing, chest tightness, or fevers greater than 100.4, return to the ER for a recheck.  You may return to the ER at any time for worsening condition or any new symptoms that concern you.  Asthma, Adult Asthma is caused by narrowing of the air passages in the lungs. It may be triggered by pollen, dust, animal dander, molds, some foods, respiratory infections, exposure to smoke, exercise, emotional stress or other allergens (things that cause allergic reactions or allergies). Repeat attacks are common. HOME CARE INSTRUCTIONS   Use prescription medications as ordered by your caregiver.   Avoid pollen, dust, animal dander, molds, smoke and other things that cause attacks at home and at work.   You may have fewer attacks if you decrease dust in your home. Electrostatic air cleaners may help.   It may help to replace your pillows or mattress with materials less likely to cause allergies.   Talk to your caregiver about an action plan for managing asthma attacks at home, including, the use of a peak flow meter which measures the severity of your asthma attack. An action plan can help minimize or stop the attack without having to seek medical care.   If you are not on a fluid restriction, drink 8 to 10 glasses of water each day.   Always have a plan prepared for seeking medical attention, including, calling your physician, accessing local emergency care, and calling 911 (in the [place] for a severe attack.   Discuss possible exercise routines with your caregiver.   If animal dander is the cause of asthma, you may need to get rid of pets.  SEEK MEDICAL CARE IF:   You have wheezing and shortness of breath even if taking medicine to prevent attacks.   You have muscle aches, chest pain or thickening of sputum.   Your sputum  changes from clear or white to yellow, green, gray, or bloody.   You have any problems that may be related to the medicine you are taking (such as a rash, itching, swelling or trouble breathing).  SEEK IMMEDIATE MEDICAL CARE IF:   Your usual medicines do not stop your wheezing or there is increased coughing and/or shortness of breath.   You have increased difficulty breathing.   You have a fever.  MAKE SURE YOU:   Understand these instructions.   Will watch your condition.   Will get help right away if you are not doing well or get worse.  Document Released: 11/18/2005 Document Revised: 11/07/2011 Document Reviewed: 07/06/2008 [facility] Patient Information 2012 [place], [place].  Asthma Prevention Cigarette smoke, house dust, molds, pollens, animal dander, certain insects, exercise, and even cold air are all triggers that can cause an asthma attack. Often, no specific triggers are identified.  Take the following measures around your house to reduce attacks:  Avoid cigarette and other smoke. No smoking should be allowed in a home where someone with asthma lives. If smoking is allowed indoors, it should be done in a room with a closed door, and a window should be opened to clear the air. If possible, do not use a wood-burning stove, kerosene heater, or fireplace. Minimize exposure to all sources of smoke, including incense, candles, fires, and fireworks.   Decrease pollen exposure. Keep your windows shut and use central air during the pollen allergy season. Stay indoors  with windows closed from late morning to afternoon, if you can. Avoid mowing the lawn if you have grass pollen allergy. Change your clothes and shower after being outside during this time of year.   Remove molds from bathrooms and wet areas. Do this by cleaning the floors with a fungicide or diluted bleach. Avoid using humidifiers, vaporizers, or swamp coolers. These can spread molds through the air. Fix leaky faucets, pipes,  or other sources of water that have mold around them.   Decrease house dust exposure. Do this by using bare floors, vacuuming frequently, and changing furnace and air cooler filters frequently. Avoid using feather, wool, or foam bedding. Use polyester pillows and plastic covers over your mattress. Wash bedding weekly in hot water (hotter than 130 F).   Try to get someone else to vacuum for you once or twice a week, if you can. Stay out of rooms while they are being vacuumed and for a short while afterward. If you vacuum, use a dust mask (from a hardware store), a double-layered or microfilter vacuum cleaner bag, or a vacuum cleaner with a HEPA filter.   Avoid perfumes, talcum powder, hair spray, paints and other strong odors and fumes.   Keep warm-blooded pets (cats, dogs, rodents, birds) outside the home if they are triggers for asthma. If you can't keep the pet outdoors, keep the pet out of your bedroom and other sleeping areas at all times, and keep the door closed. Remove carpets and furniture covered with cloth from your home. If that is not possible, keep the pet away from fabric-covered furniture and carpets.   Eliminate cockroaches. Keep food and garbage in closed containers. Never leave food out. Use poison baits, traps, powders, gels, or paste (for example, boric acid). If a spray is used to kill cockroaches, stay out of the room until the odor goes away.   Decrease indoor humidity to less than 60%. Use an indoor air cleaning device.   Avoid sulfites in foods and beverages. Do not drink beer or wine or eat dried fruit, processed potatoes, or shrimp if they cause asthma symptoms.   Avoid cold air. Cover your nose and mouth with a scarf on cold or windy days.   Avoid aspirin. This is the most common drug causing serious asthma attacks.   If exercise triggers your asthma, ask your caregiver how you should prepare before exercising. (For example, ask if you could use your inhaler 10  minutes before exercising.)   Avoid close contact with people who have a cold or the flu since your asthma symptoms may get worse if you catch the infection from them. Wash your hands thoroughly after touching items that may have been handled by others with a respiratory infection.   Get a flu shot every year to protect against the flu virus, which often makes asthma worse for days to weeks. Also get a pneumonia shot once every five to 10 years.  Call your caregiver if you want further information about measures you can take to help prevent asthma attacks. Document Released: 11/18/2005 Document Revised: 11/07/2011 Document Reviewed: 09/26/2009 [facility] Patient Information 2012 [place], [place].Asthma Prevention Cigarette smoke, house dust, molds, pollens, animal dander, certain insects, exercise, and even cold air are all triggers that can cause an asthma attack. Often, no specific triggers are identified.  Take the following measures around your house to reduce attacks:  Avoid cigarette and other smoke. No smoking should be allowed in a home where someone with asthma lives. If  smoking is allowed indoors, it should be done in a room with a closed door, and a window should be opened to clear the air. If possible, do not use a wood-burning stove, kerosene heater, or fireplace. Minimize exposure to all sources of smoke, including incense, candles, fires, and fireworks.   Decrease pollen exposure. Keep your windows shut and use central air during the pollen allergy season. Stay indoors with windows closed from late morning to afternoon, if you can. Avoid mowing the lawn if you have grass pollen allergy. Change your clothes and shower after being outside during this time of year.   Remove molds from bathrooms and wet areas. Do this by cleaning the floors with a fungicide or diluted bleach. Avoid using humidifiers, vaporizers, or swamp coolers. These can spread molds through the air. Fix leaky faucets,  pipes, or other sources of water that have mold around them.   Decrease house dust exposure. Do this by using bare floors, vacuuming frequently, and changing furnace and air cooler filters frequently. Avoid using feather, wool, or foam bedding. Use polyester pillows and plastic covers over your mattress. Wash bedding weekly in hot water (hotter than 130 F).   Try to get someone else to vacuum for you once or twice a week, if you can. Stay out of rooms while they are being vacuumed and for a short while afterward. If you vacuum, use a dust mask (from a hardware store), a double-layered or microfilter vacuum cleaner bag, or a vacuum cleaner with a HEPA filter.   Avoid perfumes, talcum powder, hair spray, paints and other strong odors and fumes.   Keep warm-blooded pets (cats, dogs, rodents, birds) outside the home if they are triggers for asthma. If you can't keep the pet outdoors, keep the pet out of your bedroom and other sleeping areas at all times, and keep the door closed. Remove carpets and furniture covered with cloth from your home. If that is not possible, keep the pet away from fabric-covered furniture and carpets.   Eliminate cockroaches. Keep food and garbage in closed containers. Never leave food out. Use poison baits, traps, powders, gels, or paste (for example, boric acid). If a spray is used to kill cockroaches, stay out of the room until the odor goes away.   Decrease indoor humidity to less than 60%. Use an indoor air cleaning device.   Avoid sulfites in foods and beverages. Do not drink beer or wine or eat dried fruit, processed potatoes, or shrimp if they cause asthma symptoms.   Avoid cold air. Cover your nose and mouth with a scarf on cold or windy days.   Avoid aspirin. This is the most common drug causing serious asthma attacks.   If exercise triggers your asthma, ask your caregiver how you should prepare before exercising. (For example, ask if you could use your inhaler 10  minutes before exercising.)   Avoid close contact with people who have a cold or the flu since your asthma symptoms may get worse if you catch the infection from them. Wash your hands thoroughly after touching items that may have been handled by others with a respiratory infection.   Get a flu shot every year to protect against the flu virus, which often makes asthma worse for days to weeks. Also get a pneumonia shot once every five to 10 years.  Call your caregiver if you want further information about measures you can take to help prevent asthma attacks. Document Released: 11/18/2005 Document Revised: 11/07/2011 Document  Reviewed: 09/26/2009 [facility] Patient Information 2012 [place], [place].

## 2012-05-05 NOTE — ED Notes (Signed)
The pt has asthma and has had sob for 3 weeks.  She has inhalers that are not working

## 2012-05-06 NOTE — ED Provider Notes (Signed)
Medical screening examination/treatment/procedure(s) were performed by non-physician practitioner and as supervising physician I was immediately available for consultation/collaboration.   Joya Gaskins, MD 05/06/12 [phone]

## 2012-07-18 ENCOUNTER — Emergency Department (HOSPITAL_COMMUNITY)
Admission: EM | Admit: 2012-07-18 | Discharge: 2012-07-18 | Disposition: A | Discharge status: Home / Self Care | Payer: Self-pay | Attending: Emergency Medicine | Admitting: Emergency Medicine

## 2012-07-18 ENCOUNTER — Encounter (HOSPITAL_COMMUNITY): Payer: Self-pay | Admitting: *Deleted

## 2012-07-18 ENCOUNTER — Emergency Department (HOSPITAL_COMMUNITY): Payer: Self-pay

## 2012-07-18 DIAGNOSIS — F17200 Nicotine dependence, unspecified, uncomplicated: Secondary | ICD-10-CM | POA: Insufficient documentation

## 2012-07-18 DIAGNOSIS — J40 Bronchitis, not specified as acute or chronic: Secondary | ICD-10-CM

## 2012-07-18 DIAGNOSIS — J9801 Acute bronchospasm: Secondary | ICD-10-CM

## 2012-07-18 DIAGNOSIS — Z79899 Other long term (current) drug therapy: Secondary | ICD-10-CM | POA: Insufficient documentation

## 2012-07-18 DIAGNOSIS — J45909 Unspecified asthma, uncomplicated: Secondary | ICD-10-CM | POA: Insufficient documentation

## 2012-07-18 MED ORDER — ALBUTEROL SULFATE (5 MG/ML) 0.5% IN NEBU
5.0000 mg | INHALATION_SOLUTION | Freq: Once | RESPIRATORY_TRACT | Status: AC
  Administered 2012-07-18: 5 mg via RESPIRATORY_TRACT
  Filled 2012-07-18: qty 1

## 2012-07-18 MED ORDER — PREDNISONE 10 MG PO TABS: 20.0000 mg | ORAL_TABLET | Freq: Every day | ORAL | Status: DC

## 2012-07-18 MED ORDER — PREDNISONE 20 MG PO TABS
60.0000 mg | ORAL_TABLET | Freq: Once | ORAL | Status: AC
  Administered 2012-07-18: 60 mg via ORAL
  Filled 2012-07-18: qty 3

## 2012-07-18 MED ORDER — IPRATROPIUM BROMIDE 0.02 % IN SOLN
0.5000 mg | Freq: Once | RESPIRATORY_TRACT | Status: AC
  Administered 2012-07-18: 0.5 mg via RESPIRATORY_TRACT
  Filled 2012-07-18: qty 2.5

## 2012-07-18 MED ORDER — AMOXICILLIN 500 MG PO CAPS: 500.0000 mg | ORAL_CAPSULE | Freq: Three times a day (TID) | ORAL | Status: DC

## 2012-07-18 NOTE — ED Notes (Signed)
Patient transported to X-ray 

## 2012-07-18 NOTE — ED Provider Notes (Signed)
History  This chart was scribed for Benny Lennert, MD by Ladona Ridgel Day. This patient was seen in room TR08C/TR08C and the patient's care was started at 1002.   CSN: [id number]  Arrival date & time 07/18/12  1002   First MD Initiated Contact with Patient 07/18/12 1133      Chief Complaint  Patient presents with  . Asthma   Patient is a 41 y.o. female presenting with shortness of breath. The history is provided by the patient. No language interpreter was used.  Shortness of Breath  The current episode started 3 to 5 days ago. The problem has been gradually worsening. The problem is moderate. Nothing relieves the symptoms. Associated symptoms include cough (Yellow productive cough. ) and shortness of breath. Pertinent negatives include no chest pain. There was no intake of a foreign body. She has had no prior steroid use.   Jasmine Heath is a 41 y.o. female who presents to the Emergency Department with history of asthma complaining of 3 days of worsening asthma symptoms and SOB. She states her at home albuterol medication has not improved her symptoms and she has been taking it as needed x5/day. She states a yellow productive cough. She denies any other injuries/illnesses at this time.  Past Medical History  Diagnosis Date  . Asthma     History reviewed. No pertinent past surgical history.  History reviewed. No pertinent family history.  History  Substance Use Topics  . Smoking status: Current Everyday Smoker  . Smokeless tobacco: Not on file  . Alcohol Use: Yes    OB History    Grav Para Term Preterm Abortions TAB SAB Ect Mult Living                  Review of Systems  Constitutional: Negative for fatigue.  HENT: Positive for congestion. Negative for sinus pressure and ear discharge.   Eyes: Negative for discharge.  Respiratory: Positive for cough (Yellow productive cough. ) and shortness of breath.   Cardiovascular: Negative for chest pain.  Gastrointestinal: Negative  for abdominal pain and diarrhea.  Genitourinary: Negative for frequency and hematuria.  Musculoskeletal: Negative for back pain.  Skin: Negative for rash.  Neurological: Negative for seizures and headaches.  Hematological: Negative.   Psychiatric/Behavioral: Negative for hallucinations.  All other systems reviewed and are negative.    Allergies  Review of patient's allergies indicates no known allergies.  Home Medications   Current Outpatient Rx  Name Route Sig Dispense Refill  . ALBUTEROL SULFATE HFA 108 (90 BASE) MCG/ACT IN AERS Inhalation Inhale 1-2 puffs into the lungs every 6 (six) hours as needed for wheezing. 1 Inhaler 0  . THERAGRAN-M PO TABS Oral Take 1 tablet by mouth daily.        Triage Vitals: BP 146/82  Pulse 86  Temp 98.5 F (36.9 C) (Oral)  Resp 20  SpO2 97%  LMP 07/04/2012  Physical Exam  Nursing note and vitals reviewed. Constitutional: She is oriented to person, place, and time. She appears well-developed.  HENT:  Head: Normocephalic.  Eyes: Conjunctivae are normal.  Neck: No tracheal deviation present.  Cardiovascular:  No murmur heard. Pulmonary/Chest: She has wheezes (Mild wheezing throughout. ).  Musculoskeletal: Normal range of motion.  Neurological: She is alert and oriented to person, place, and time.  Skin: Skin is warm.  Psychiatric: She has a normal mood and affect.    ED Course  Procedures (including critical care time) DIAGNOSTIC STUDIES: Oxygen Saturation is  97% on  room air, adequate by my interpretation.    COORDINATION OF CARE: At 1130 AM Patient states that her first breathing treatment here in the ED improved her symptoms  At 1137 AM Discussed treatment plan with patient which includes another breathing treatment and prednieson. Patient agrees.   Labs Reviewed - No data to display No results found.   No diagnosis found.    MDM  The chart was scribed for me under my direct supervision.  I personally performed the  history, physical, and medical decision making and all procedures in the evaluation of this patient.Benny Lennert, MD 07/18/12 [phone]

## 2012-07-18 NOTE — ED Notes (Signed)
Reports having asthma and sob x 3 days, no relief with inhalers at home. Audible wheezing and congestion noted at triage, airway is intact, able to speak in full sentences.

## 2012-07-19 ENCOUNTER — Encounter (HOSPITAL_COMMUNITY): Payer: Self-pay | Admitting: Physical Medicine and Rehabilitation

## 2012-07-19 ENCOUNTER — Emergency Department (HOSPITAL_COMMUNITY)
Admission: EM | Admit: 2012-07-19 | Discharge: 2012-07-19 | Disposition: A | Discharge status: Home / Self Care | Payer: Self-pay | Attending: Emergency Medicine | Admitting: Emergency Medicine

## 2012-07-19 DIAGNOSIS — Z87891 Personal history of nicotine dependence: Secondary | ICD-10-CM | POA: Insufficient documentation

## 2012-07-19 DIAGNOSIS — J45909 Unspecified asthma, uncomplicated: Secondary | ICD-10-CM | POA: Insufficient documentation

## 2012-07-19 MED ORDER — ALBUTEROL SULFATE (5 MG/ML) 0.5% IN NEBU
5.0000 mg | INHALATION_SOLUTION | Freq: Once | RESPIRATORY_TRACT | Status: AC
  Administered 2012-07-19: 5 mg via RESPIRATORY_TRACT
  Filled 2012-07-19: qty 1

## 2012-07-19 MED ORDER — IPRATROPIUM BROMIDE 0.02 % IN SOLN
0.5000 mg | Freq: Once | RESPIRATORY_TRACT | Status: AC
  Administered 2012-07-19: 0.5 mg via RESPIRATORY_TRACT
  Filled 2012-07-19: qty 2.5

## 2012-07-19 MED ORDER — LORAZEPAM 1 MG PO TABS: 1.0000 mg | ORAL_TABLET | Freq: Three times a day (TID) | ORAL | Status: AC | PRN

## 2012-07-19 MED ORDER — LORAZEPAM 1 MG PO TABS
1.0000 mg | ORAL_TABLET | Freq: Once | ORAL | Status: AC
  Administered 2012-07-19: 1 mg via ORAL
  Filled 2012-07-19: qty 2

## 2012-07-19 MED ORDER — IPRATROPIUM BROMIDE HFA 17 MCG/ACT IN AERS: 2.0000 | INHALATION_SPRAY | Freq: Four times a day (QID) | RESPIRATORY_TRACT | Status: DC

## 2012-07-19 MED ORDER — METHYLPREDNISOLONE SODIUM SUCC 125 MG IJ SOLR
125.0000 mg | Freq: Once | INTRAMUSCULAR | Status: AC
  Administered 2012-07-19: 125 mg via INTRAMUSCULAR
  Filled 2012-07-19: qty 2

## 2012-07-19 NOTE — ED Provider Notes (Signed)
History  This chart was scribed for Jasmine Lennert, MD by Jasmine Heath. This patient was seen in room TR08C/TR08C and the patient's care was started at 18:15.   CSN: [id number]  Arrival date & time 07/19/12  1746   None     Chief Complaint  Patient presents with  . Asthma    (Consider location/radiation/quality/duration/timing/severity/associated sxs/prior Treatment) Jasmine Heath is a 41 y.o. female who presents to the Emergency Department complaining of gradually worsening SOB, wheezing, and productive cough. Pt reports she was seen here yesterday and prescribed antibiotics and prednisone. Pt reports she is using the inhaler and taking the prednisone as prescribed but the symptoms have persisted. Pt reports 5:15 this evening (about an hours ago) was the last time she used an inhaler and the pt was given a breathing treatment since arriving.  Patient is a 41 y.o. female presenting with asthma. The history is provided by the patient. No language interpreter was used.  Asthma This is a recurrent problem. The current episode started more than 2 days ago. The problem occurs constantly. The problem has been gradually worsening. Associated symptoms include shortness of breath. Nothing aggravates the symptoms. Nothing relieves the symptoms. Treatments tried: albuterol and prednisone. The treatment provided no relief.    Past Medical History  Diagnosis Date  . Asthma     No past surgical history on file.  No family history on file.  History  Substance Use Topics  . Smoking status: Former Games developer  . Smokeless tobacco: Not on file  . Alcohol Use: Yes    OB History    Grav Para Term Preterm Abortions TAB SAB Ect Mult Living                  Review of Systems  Constitutional: Negative for fever and chills.  Respiratory: Positive for cough, shortness of breath and wheezing.   Gastrointestinal: Negative for nausea and vomiting.  Genitourinary: Negative for dysuria.    Neurological: Negative for weakness.    Allergies  Review of patient's allergies indicates no known allergies.  Home Medications   Current Outpatient Rx  Name Route Sig Dispense Refill  . ALBUTEROL SULFATE HFA 108 (90 BASE) MCG/ACT IN AERS Inhalation Inhale 1-2 puffs into the lungs every 6 (six) hours as needed for wheezing. 1 Inhaler 0  . AMOXICILLIN 500 MG PO CAPS Oral Take 1 capsule (500 mg total) by mouth 3 (three) times daily. 21 capsule 0  . THERAGRAN-M PO TABS Oral Take 1 tablet by mouth daily.      Marland Kitchen PREDNISONE 10 MG PO TABS Oral Take 2 tablets (20 mg total) by mouth daily. 15 tablet 0    BP 113/69  Pulse 94  Temp 98.4 F (36.9 C) (Oral)  Resp 22  SpO2 97%  LMP 07/04/2012  Physical Exam  Nursing note and vitals reviewed. Constitutional: She is oriented to person, place, and time. She appears well-developed and well-nourished. No distress.  HENT:  Head: Normocephalic and atraumatic.  Eyes: Conjunctivae are normal.  Neck: No tracheal deviation present.  Cardiovascular:  No murmur heard. Pulmonary/Chest: She has wheezes.       Wheezing throughout  Abdominal: Soft. There is no tenderness.  Musculoskeletal: Normal range of motion.  Neurological: She is oriented to person, place, and time.  Skin: Skin is warm.  Psychiatric: She has a normal mood and affect.    ED Course  Procedures (including critical care time) DIAGNOSTIC STUDIES: Oxygen Saturation is 97% on room air, adequate  by my interpretation.    COORDINATION OF CARE: 18:20--I evaluated the patient and we discussed a treatment plan including medication injection to which the pt agreed.   19:51--I rechecked the pt who is still receiving a breathing treatment and sleeping upon entry. I told the pt that I would prescribe her another inhaler and some medication for emergencies and notified her that her oxygen levels are fine.   Labs Reviewed - No data to display Dg Chest 2 View  07/18/2012  *RADIOLOGY  REPORT*  Clinical Data: Short of breath and cough  CHEST - 2 VIEW  Comparison: 05/05/2012  Findings: Normal heart size.  Mild bronchitic changes are stable. No consolidation, mass.  No pleural effusion.  No pneumothorax.  IMPRESSION: Stable mild bronchitic changes which likely chronic.  Original Report Authenticated By: Donavan Burnet, M.D.     No diagnosis found.    MDM        The chart was scribed for me under my direct supervision.  I personally performed the history, physical, and medical decision making and all procedures in the evaluation of this patient.Jasmine Lennert, MD 07/19/12 [phone]

## 2012-07-19 NOTE — ED Notes (Signed)
The patient is AOx4 and comfortable with the discharge instructions. 

## 2012-07-19 NOTE — ED Notes (Signed)
Pt presents to department for evaluation of asthma. States she was seen yesterday for same and diagnosed with bronchitis, was sent home with antibiotic and prednisone. Pt states SOB increased today, expiratory wheezing noted upon arrival to ED. Pt also has dry cough. Speaking complete sentences at the time.

## 2012-07-20 ENCOUNTER — Emergency Department (HOSPITAL_COMMUNITY)
Admission: EM | Admit: 2012-07-20 | Discharge: 2012-07-20 | Disposition: A | Discharge status: Home / Self Care | Payer: Self-pay | Attending: Emergency Medicine | Admitting: Emergency Medicine

## 2012-07-20 ENCOUNTER — Encounter (HOSPITAL_COMMUNITY): Payer: Self-pay | Admitting: *Deleted

## 2012-07-20 DIAGNOSIS — J40 Bronchitis, not specified as acute or chronic: Secondary | ICD-10-CM

## 2012-07-20 DIAGNOSIS — J45909 Unspecified asthma, uncomplicated: Secondary | ICD-10-CM | POA: Insufficient documentation

## 2012-07-20 DIAGNOSIS — Z87891 Personal history of nicotine dependence: Secondary | ICD-10-CM | POA: Insufficient documentation

## 2012-07-20 LAB — D-DIMER, QUANTITATIVE: D-Dimer, Quant: <0.22 ug/mL-FEU (ref 0.00–0.48)

## 2012-07-20 MED ORDER — ALBUTEROL SULFATE (5 MG/ML) 0.5% IN NEBU
5.0000 mg | INHALATION_SOLUTION | Freq: Once | RESPIRATORY_TRACT | Status: AC
  Administered 2012-07-20: 5 mg via RESPIRATORY_TRACT
  Filled 2012-07-20: qty 1

## 2012-07-20 MED ORDER — IPRATROPIUM BROMIDE 0.02 % IN SOLN
0.5000 mg | Freq: Once | RESPIRATORY_TRACT | Status: AC
  Administered 2012-07-20: 0.5 mg via RESPIRATORY_TRACT
  Filled 2012-07-20: qty 2.5

## 2012-07-20 NOTE — ED Notes (Signed)
Pt states x's 5 days has been having asthma attacks. Reports SOB with activity. States this is 3rd visit in 72hrs.

## 2012-07-20 NOTE — ED Notes (Signed)
Reports improvement of symptoms with breathing treatment.

## 2012-07-20 NOTE — ED Notes (Signed)
Pt yelling in waiting room, "Is my health not important enough to get back there? I can't breathe." pt placed in triage room and VS obtained. No change in VS or oxygen level.

## 2012-07-20 NOTE — ED Notes (Signed)
Pt states that she has had a cough and sore throat for the past 5 days.  States she has been given prescriptions for amoxicillin, prednisone and breathing tx, but has not helped her get better

## 2012-07-20 NOTE — ED Notes (Signed)
[id number]<AV> Expected date:<BR> Expected time:<BR> Means of arrival:<BR> Comments:<BR> Hold for [employer] in [place]

## 2012-07-26 NOTE — ED Provider Notes (Addendum)
History    41 year old female with cough and mild shortness of breath. Symptom onset was about 5 days ago. Relatively constant without appreciable exacerbating relieving factors. This is patient's third ER evaluation the past 3 days for the same complaint. She was prescribed amoxicillin prednisone on her last ED visit. She was turning today she says her symptoms have not changed at all. No fevers or chills. No unusual leg pain or swelling. Denies history of blood clot. CSN: [id number]  Arrival date & time 07/20/12  1501   First MD Initiated Contact with Patient 07/20/12 1844      Chief Complaint  Patient presents with  . Shortness of Breath  . Asthma    (Consider location/radiation/quality/duration/timing/severity/associated sxs/prior treatment) HPI  Past Medical History  Diagnosis Date  . Asthma     Past Surgical History  Procedure Date  . Foot surgery     History reviewed. No pertinent family history.  History  Substance Use Topics  . Smoking status: Former Smoker    Quit date: 07/05/2012  . Smokeless tobacco: Not on file  . Alcohol Use: Yes     occ    OB History    Grav Para Term Preterm Abortions TAB SAB Ect Mult Living                  Review of Systems  Review of symptoms negative unless otherwise noted in HPI.  Allergies  Review of patient's allergies indicates no known allergies.  Home Medications   Current Outpatient Rx  Name Route Sig Dispense Refill  . ALBUTEROL SULFATE HFA 108 (90 BASE) MCG/ACT IN AERS Inhalation Inhale 1-2 puffs into the lungs every 6 (six) hours as needed for wheezing. 1 Inhaler 0  . ALBUTEROL SULFATE (2.5 MG/3ML) 0.083% IN NEBU Nebulization Take 2.5 mg by nebulization every 6 (six) hours as needed. For wheezing and shortness of breath    . AMOXICILLIN 500 MG PO CAPS Oral Take 500 mg by mouth 3 (three) times daily.    Marland Kitchen LORAZEPAM 1 MG PO TABS Oral Take 1 tablet (1 mg total) by mouth 3 (three) times daily as needed for anxiety.  15 tablet 0  . ADULT MULTIVITAMIN W/MINERALS CH Oral Take 1 tablet by mouth daily.    Marland Kitchen PREDNISONE 10 MG PO TABS Oral Take 20 mg by mouth daily.    . IPRATROPIUM BROMIDE HFA 17 MCG/ACT IN AERS Inhalation Inhale 2 puffs into the lungs every 6 (six) hours. 1 Inhaler 12    BP 133/69  Pulse 81  Temp 98.6 F (37 C) (Oral)  Resp 18  SpO2 98%  LMP 07/04/2012  Physical Exam  Nursing note and vitals reviewed. Constitutional: She appears well-developed and well-nourished. No distress.       Laying in bed. No acute distress.  HENT:  Head: Normocephalic and atraumatic.  Eyes: Conjunctivae are normal. Right eye exhibits no discharge. Left eye exhibits no discharge.  Neck: Neck supple.  Cardiovascular: Normal rate, regular rhythm and normal heart sounds.  Exam reveals no gallop and no friction rub.   No murmur heard. Pulmonary/Chest: Effort normal. No respiratory distress. She has wheezes.       Faint expiratory wheezing bilaterally. Speaking in complete sentences. No accessory muscle usage.  Abdominal: Soft. She exhibits no distension. There is no tenderness.  Musculoskeletal: She exhibits no edema and no tenderness.       Lower extremities symmetric as compared to each other. No calf tenderness. Negative Homan's. No palpable cords.  Neurological: She is alert.  Skin: Skin is warm and dry.  Psychiatric: She has a normal mood and affect. Her behavior is normal. Thought content normal.    ED Course  Procedures (including critical care time)   Labs Reviewed  D-DIMER, QUANTITATIVE  LAB REPORT - SCANNED   No results found.  EKG:  Rhythm: normal sinus Vent. rate 84 BPM PR interval 152 ms QRS duration 80 ms QT/QTc 376/444 ms Axis: normal Intervals: normal ST segments: normal   1. Bronchitis       MDM  40yf with dyspnea and cough. Suspect viral bronchitis. Third ED visit in past couple days for same. No respiratory distress on exam. Prescribed abx on steroids on previous  visits. Low suspicion for PE and normal dimer makes extremely unlikely. Return precautions were discussed. Outpatient followup        Raeford Razor, MD 07/26/12 1516  Raeford Razor, MD 09/08/12 1536

## 2012-10-19 ENCOUNTER — Encounter (HOSPITAL_COMMUNITY): Payer: Self-pay | Admitting: Adult Health

## 2012-10-19 ENCOUNTER — Emergency Department (HOSPITAL_COMMUNITY): Payer: Self-pay

## 2012-10-19 DIAGNOSIS — R079 Chest pain, unspecified: Secondary | ICD-10-CM | POA: Insufficient documentation

## 2012-10-19 LAB — BASIC METABOLIC PANEL
BUN: 11 mg/dL (ref 6–23)
Creatinine, Ser: 0.77 mg/dL (ref 0.50–1.10)
GFR calc non Af Amer: >90 mL/min (ref >90)
Glucose, Bld: 108 mg/dL — ABNORMAL HIGH (ref 70–99)
Potassium: 3.9 mEq/L (ref 3.5–5.1)

## 2012-10-19 LAB — CBC
HCT: 36.2 % (ref 36.0–46.0)
Hemoglobin: 12.1 g/dL (ref 12.0–15.0)
MCHC: 33.4 g/dL (ref 30.0–36.0)
MCV: 91.6 fL (ref 78.0–100.0)

## 2012-10-19 NOTE — ED Notes (Signed)
Presents with sharp left sided chest pain radiates to left breast and left side associated with dizziness. Pain is internittent and increasing in intensity. Exertion makes pain worse and nothing makes pain better. denies nausea and SOB.

## 2012-10-19 NOTE — ED Notes (Signed)
Patient called x3 by EMT to be called to back. No answer. Tech walked back to xray to make sure patient was not in back. RN notified

## 2012-10-20 ENCOUNTER — Emergency Department (HOSPITAL_COMMUNITY)
Admission: EM | Admit: 2012-10-20 | Discharge: 2012-10-20 | Discharge status: Against Medical Advice | Payer: Self-pay | Attending: Emergency Medicine | Admitting: Emergency Medicine

## 2012-11-13 ENCOUNTER — Emergency Department (HOSPITAL_COMMUNITY): Payer: Medicaid Other

## 2012-11-13 ENCOUNTER — Encounter (HOSPITAL_COMMUNITY): Payer: Self-pay | Admitting: *Deleted

## 2012-11-13 ENCOUNTER — Inpatient Hospital Stay (HOSPITAL_COMMUNITY)
Admission: EM | Admit: 2012-11-13 | Discharge: 2012-11-17 | DRG: 202 | Disposition: A | Discharge status: Home / Self Care | Payer: Medicaid Other | Attending: Internal Medicine | Admitting: Internal Medicine

## 2012-11-13 DIAGNOSIS — Z23 Encounter for immunization: Secondary | ICD-10-CM

## 2012-11-13 DIAGNOSIS — Z79899 Other long term (current) drug therapy: Secondary | ICD-10-CM

## 2012-11-13 DIAGNOSIS — J45902 Unspecified asthma with status asthmaticus: Principal | ICD-10-CM | POA: Diagnosis present

## 2012-11-13 DIAGNOSIS — Z6841 Body Mass Index (BMI) 40.0 and over, adult: Secondary | ICD-10-CM

## 2012-11-13 DIAGNOSIS — R0602 Shortness of breath: Secondary | ICD-10-CM | POA: Diagnosis present

## 2012-11-13 DIAGNOSIS — K029 Dental caries, unspecified: Secondary | ICD-10-CM | POA: Diagnosis present

## 2012-11-13 DIAGNOSIS — D649 Anemia, unspecified: Secondary | ICD-10-CM | POA: Diagnosis present

## 2012-11-13 DIAGNOSIS — E876 Hypokalemia: Secondary | ICD-10-CM | POA: Diagnosis present

## 2012-11-13 DIAGNOSIS — E119 Type 2 diabetes mellitus without complications: Secondary | ICD-10-CM | POA: Diagnosis present

## 2012-11-13 DIAGNOSIS — J45901 Unspecified asthma with (acute) exacerbation: Secondary | ICD-10-CM

## 2012-11-13 DIAGNOSIS — F17200 Nicotine dependence, unspecified, uncomplicated: Secondary | ICD-10-CM | POA: Diagnosis present

## 2012-11-13 DIAGNOSIS — K5909 Other constipation: Secondary | ICD-10-CM | POA: Diagnosis present

## 2012-11-13 MED ORDER — ALBUTEROL (5 MG/ML) CONTINUOUS INHALATION SOLN
10.0000 mg/h | INHALATION_SOLUTION | RESPIRATORY_TRACT | Status: AC
  Administered 2012-11-13: 10 mg/h via RESPIRATORY_TRACT
  Filled 2012-11-13: qty 20

## 2012-11-13 MED ORDER — SODIUM CHLORIDE 0.9 % IV SOLN: 1000.0000 mL | INTRAVENOUS | Status: DC

## 2012-11-13 MED ORDER — IPRATROPIUM BROMIDE 0.02 % IN SOLN
0.5000 mg | Freq: Once | RESPIRATORY_TRACT | Status: AC
  Administered 2012-11-13: 0.5 mg via RESPIRATORY_TRACT
  Filled 2012-11-13: qty 2.5

## 2012-11-13 MED ORDER — SODIUM CHLORIDE 0.9 % IV SOLN
1000.0000 mL | Freq: Once | INTRAVENOUS | Status: AC
  Administered 2012-11-14: 1000 mL via INTRAVENOUS

## 2012-11-13 MED ORDER — ALBUTEROL SULFATE (5 MG/ML) 0.5% IN NEBU
5.0000 mg | INHALATION_SOLUTION | Freq: Once | RESPIRATORY_TRACT | Status: AC
  Administered 2012-11-13: 5 mg via RESPIRATORY_TRACT
  Filled 2012-11-13: qty 1

## 2012-11-13 MED ORDER — PREDNISONE 50 MG PO TABS
60.0000 mg | ORAL_TABLET | Freq: Every day | ORAL | Status: DC
  Administered 2012-11-14: 60 mg via ORAL
  Filled 2012-11-13 (×3): qty 1

## 2012-11-13 NOTE — ED Notes (Signed)
The pt has had sob for 2 days.  She has asthma and 3 days ago she kissed a child that was ill with the flu.     Minimal productive cough or no.  No  Acute distress

## 2012-11-13 NOTE — ED Notes (Signed)
Patient currently sitting up in bed; no respiratory or acute distress noted.  Patient updated on plan of care; informed patient that she is being moved to CDU after IV start.  Report given to [place], RN by Felipa Eth, RN.

## 2012-11-13 NOTE — ED Notes (Signed)
Respiratory therapist at bedside.

## 2012-11-13 NOTE — ED Notes (Signed)
Respiratory therapist called to administer continuous nebulizer treatment.

## 2012-11-13 NOTE — ED Provider Notes (Signed)
History     CSN: [id number]  Arrival date & time 11/13/12  1634   First MD Initiated Contact with Patient 11/13/12 2237      Chief Complaint  Patient presents with  . Shortness of Breath    (Consider location/radiation/quality/duration/timing/severity/associated sxs/prior treatment) The history is provided by the patient and medical records.    Jasmine Heath is a 41 y.o. female  with a hx of asthma presents to the Emergency Department complaining of gradual, persistent, progressively worsening shortness of breath onset 2 days ago.  Patient states 3 days ago she gets a child who was thought to have the flu but he had definite upper respiratory symptoms including cough, congestion and fever.   She states the next day she began to have shortness of breath and wheezing.   She used her home treatments without relief.   She states this happened she usually needs prednisone.  Associated symptoms include wheezing, coughing, difficulty breathing, chest tightness.  Inhaler makes it better and exertion makes it worse.  Pt denies fever, chills, headache, abdominal pain, nausea, vomiting, diarrhea, weakness, syncope, dysuria, hematuria, frequency, urgency.   Past Medical History  Diagnosis Date  . Asthma     Past Surgical History  Procedure Date  . Foot surgery     No family history on file.  History  Substance Use Topics  . Smoking status: Current Every Day Smoker    Last Attempt to Quit: 07/05/2012  . Smokeless tobacco: Not on file  . Alcohol Use: Yes     Comment: occ    OB History    Grav Para Term Preterm Abortions TAB SAB Ect Mult Living                  Review of Systems  Constitutional: Negative for fever, diaphoresis, appetite change, fatigue and unexpected weight change.  HENT: Negative for mouth sores and neck stiffness.   Eyes: Negative for visual disturbance.  Respiratory: Positive for cough, chest tightness, shortness of breath and wheezing.   Cardiovascular:  Negative for chest pain.  Gastrointestinal: Negative for nausea, vomiting, abdominal pain, diarrhea and constipation.  Genitourinary: Negative for dysuria, urgency, frequency and hematuria.  Skin: Negative for rash.  Neurological: Negative for syncope, light-headedness and headaches.  Psychiatric/Behavioral: Negative for sleep disturbance. The patient is not nervous/anxious.   All other systems reviewed and are negative.    Allergies  Review of patient's allergies indicates no known allergies.  Home Medications   Current Outpatient Rx  Name  Route  Sig  Dispense  Refill  . ALBUTEROL SULFATE HFA 108 (90 BASE) MCG/ACT IN AERS   Inhalation   Inhale 1-2 puffs into the lungs every 6 (six) hours as needed for wheezing.   1 Inhaler   0   . MOMETASONE FUROATE 50 MCG/ACT NA SUSP   Nasal   Place 2 sprays into the nose daily as needed. For nasal congestion         . ADULT MULTIVITAMIN W/MINERALS CH   Oral   Take 1 tablet by mouth daily.           BP 123/88  Pulse 88  Temp 98.9 F (37.2 C) (Oral)  Resp 24  SpO2 98%  LMP 11/13/2012  Physical Exam  Nursing note and vitals reviewed. Constitutional: She is oriented to person, place, and time. She appears well-developed and well-nourished. No distress.  HENT:  Head: Normocephalic and atraumatic.  Right Ear: Tympanic membrane, external ear and ear canal normal.  Left Ear: Tympanic membrane, external ear and ear canal normal.  Nose: Nose normal. Right sinus exhibits no maxillary sinus tenderness and no frontal sinus tenderness. Left sinus exhibits no maxillary sinus tenderness and no frontal sinus tenderness.  Mouth/Throat: Uvula is midline, oropharynx is clear and moist and mucous membranes are normal. No oropharyngeal exudate, posterior oropharyngeal edema, posterior oropharyngeal erythema or tonsillar abscesses.  Eyes: Conjunctivae normal are normal. Pupils are equal, round, and reactive to light. No scleral icterus.  Neck:  Normal range of motion. Neck supple.  Cardiovascular: Normal rate, regular rhythm, S1 normal, S2 normal, normal heart sounds and intact distal pulses.   Pulses:      Radial pulses are 2+ on the right side, and 2+ on the left side.       Dorsalis pedis pulses are 2+ on the right side, and 2+ on the left side.       Posterior tibial pulses are 2+ on the right side, and 2+ on the left side.  Pulmonary/Chest: Accessory muscle usage (mild) present. Tachypnea noted. No respiratory distress. She has decreased breath sounds (throughout). She has wheezes (throughout). She has no rhonchi. She has no rales. She exhibits no tenderness.  Abdominal: Soft. Bowel sounds are normal. She exhibits no distension and no mass. There is no tenderness. There is no rebound and no guarding.  Musculoskeletal: Normal range of motion. She exhibits no edema and no tenderness.  Lymphadenopathy:    She has no cervical adenopathy.  Neurological: She is alert and oriented to person, place, and time. She exhibits normal muscle tone. Coordination normal.       Speech is clear and goal oriented Moves extremities without ataxia  Skin: Skin is warm and dry. No rash noted. She is not diaphoretic. No erythema.  Psychiatric: She has a normal mood and affect.    ED Course  Procedures (including critical care time)  Labs Reviewed - No data to display Dg Chest 2 View  11/13/2012  *RADIOLOGY REPORT*  Clinical Data: Shortness of breath.  Right-sided chest pain and cough and congestion.  CHEST - 2 VIEW  Comparison: 10/19/2012  Findings: Heart size and pulmonary vascularity are normal and the lungs are clear.  No effusions.  No osseous abnormality.  IMPRESSION: Normal exam.   Original Report Authenticated By: Francene Boyers, M.D.      1. Asthma exacerbation       MDM  Jasmine Heath presents with asthma exacerbation after having sick contacts.  Oxygen saturations within normal limits.  Patient with some relief after 2 nebulizers  the continues to wheeze and feel short of breath.  Afebrile, vital signs stable, non-toxic, nonseptic appearing. Chest x-ray without evidence of effusion or pneumonia.  Patient be transferred to the CDU and placed on the wheeze protocol. I discussed the patient with Arthor Captain PA-C who will assume care.         Dahlia Client [place] [employer], PA-C 11/13/12 2313

## 2012-11-13 NOTE — ED Notes (Signed)
Pt remains in waiting area.  Brought back into triage for reassessment.  Pt reports needing another breathing tx.  States she was feeling a little better after treatment 4 hours ago.  Pt with audible wheezing at this time.  Updated on wait for treatment room and vitals rechecked.

## 2012-11-13 NOTE — ED Provider Notes (Signed)
Medical screening examination/treatment/procedure(s) were performed by non-physician practitioner and as supervising physician I was immediately available for consultation/collaboration.   Charles B. Bernette Mayers, MD 11/13/12 [phone]

## 2012-11-13 NOTE — ED Notes (Signed)
hhn given 

## 2012-11-14 ENCOUNTER — Encounter (HOSPITAL_COMMUNITY): Payer: Self-pay | Admitting: Internal Medicine

## 2012-11-14 DIAGNOSIS — E876 Hypokalemia: Secondary | ICD-10-CM | POA: Diagnosis present

## 2012-11-14 DIAGNOSIS — R0602 Shortness of breath: Secondary | ICD-10-CM | POA: Diagnosis present

## 2012-11-14 DIAGNOSIS — J45902 Unspecified asthma with status asthmaticus: Principal | ICD-10-CM | POA: Diagnosis present

## 2012-11-14 DIAGNOSIS — J45901 Unspecified asthma with (acute) exacerbation: Secondary | ICD-10-CM

## 2012-11-14 LAB — BASIC METABOLIC PANEL
Chloride: 106 mEq/L (ref 96–112)
Creatinine, Ser: 0.68 mg/dL (ref 0.50–1.10)
GFR calc Af Amer: >90 mL/min (ref >90)
Potassium: 3.3 mEq/L — ABNORMAL LOW (ref 3.5–5.1)

## 2012-11-14 LAB — CBC
HCT: 33.7 % — ABNORMAL LOW (ref 36.0–46.0)
Hemoglobin: 11.1 g/dL — ABNORMAL LOW (ref 12.0–15.0)
RDW: 13.5 % (ref 11.5–15.5)
WBC: 6.9 10*3/uL (ref 4.0–10.5)

## 2012-11-14 LAB — MRSA PCR SCREENING: MRSA by PCR: NEGATIVE

## 2012-11-14 LAB — TSH: TSH: 0.965 u[IU]/mL (ref 0.350–4.500)

## 2012-11-14 MED ORDER — ALBUTEROL SULFATE (5 MG/ML) 0.5% IN NEBU
2.5000 mg | INHALATION_SOLUTION | RESPIRATORY_TRACT | Status: DC
  Administered 2012-11-14 (×3): 2.5 mg via RESPIRATORY_TRACT
  Filled 2012-11-14: qty 0.5

## 2012-11-14 MED ORDER — ALUM & MAG HYDROXIDE-SIMETH 200-200-20 MG/5ML PO SUSP: 30.0000 mL | Freq: Four times a day (QID) | ORAL | Status: DC | PRN

## 2012-11-14 MED ORDER — METHYLPREDNISOLONE SODIUM SUCC 125 MG IJ SOLR
80.0000 mg | Freq: Three times a day (TID) | INTRAMUSCULAR | Status: DC
  Filled 2012-11-14: qty 1.28

## 2012-11-14 MED ORDER — MAGNESIUM SULFATE IN D5W 10-5 MG/ML-% IV SOLN
1.0000 g | Freq: Once | INTRAVENOUS | Status: AC
  Administered 2012-11-14: 1 g via INTRAVENOUS
  Filled 2012-11-14: qty 100

## 2012-11-14 MED ORDER — ONDANSETRON HCL 4 MG/2ML IJ SOLN: 4.0000 mg | Freq: Four times a day (QID) | INTRAMUSCULAR | Status: DC | PRN

## 2012-11-14 MED ORDER — OXYCODONE HCL 5 MG PO TABS
5.0000 mg | ORAL_TABLET | ORAL | Status: DC | PRN
  Administered 2012-11-14: 5 mg via ORAL
  Administered 2012-11-15: 10 mg via ORAL
  Administered 2012-11-16: 5 mg via ORAL
  Filled 2012-11-14 (×2): qty 1
  Filled 2012-11-14: qty 2

## 2012-11-14 MED ORDER — SODIUM CHLORIDE 0.9 % IV SOLN: INTRAVENOUS | Status: DC

## 2012-11-14 MED ORDER — POTASSIUM CHLORIDE CRYS ER 20 MEQ PO TBCR
40.0000 meq | EXTENDED_RELEASE_TABLET | Freq: Once | ORAL | Status: AC
  Administered 2012-11-14: 40 meq via ORAL
  Filled 2012-11-14: qty 2

## 2012-11-14 MED ORDER — METHYLPREDNISOLONE SODIUM SUCC 125 MG IJ SOLR
60.0000 mg | Freq: Three times a day (TID) | INTRAMUSCULAR | Status: DC
  Administered 2012-11-14 – 2012-11-15 (×3): 60 mg via INTRAVENOUS
  Filled 2012-11-14 (×6): qty 0.96

## 2012-11-14 MED ORDER — ENOXAPARIN SODIUM 40 MG/0.4ML ~~LOC~~ SOLN
40.0000 mg | SUBCUTANEOUS | Status: DC
  Administered 2012-11-14 – 2012-11-17 (×4): 40 mg via SUBCUTANEOUS
  Filled 2012-11-14 (×4): qty 0.4

## 2012-11-14 MED ORDER — ACETAMINOPHEN 650 MG RE SUPP: 650.0000 mg | Freq: Four times a day (QID) | RECTAL | Status: DC | PRN

## 2012-11-14 MED ORDER — SODIUM CHLORIDE 0.9 % IV SOLN
INTRAVENOUS | Status: DC
  Administered 2012-11-14: 20:00:00 via INTRAVENOUS

## 2012-11-14 MED ORDER — ACETAMINOPHEN 325 MG PO TABS: 650.0000 mg | ORAL_TABLET | Freq: Four times a day (QID) | ORAL | Status: DC | PRN

## 2012-11-14 MED ORDER — DEXTROSE 5 % IV SOLN
500.0000 mg | INTRAVENOUS | Status: DC
  Administered 2012-11-14 – 2012-11-17 (×4): 500 mg via INTRAVENOUS
  Filled 2012-11-14 (×4): qty 500

## 2012-11-14 MED ORDER — OXYCODONE HCL 5 MG PO TABS: 5.0000 mg | ORAL_TABLET | ORAL | Status: DC | PRN

## 2012-11-14 MED ORDER — IPRATROPIUM BROMIDE 0.02 % IN SOLN: 0.5000 mg | Freq: Four times a day (QID) | RESPIRATORY_TRACT | Status: DC

## 2012-11-14 MED ORDER — METHYLPREDNISOLONE SODIUM SUCC 125 MG IJ SOLR
125.0000 mg | Freq: Four times a day (QID) | INTRAMUSCULAR | Status: DC
  Administered 2012-11-14: 125 mg via INTRAVENOUS
  Filled 2012-11-14 (×3): qty 2

## 2012-11-14 MED ORDER — LORATADINE 10 MG PO TABS
10.0000 mg | ORAL_TABLET | Freq: Every day | ORAL | Status: DC
  Administered 2012-11-15 – 2012-11-17 (×3): 10 mg via ORAL
  Filled 2012-11-14 (×3): qty 1

## 2012-11-14 MED ORDER — ALBUTEROL SULFATE (5 MG/ML) 0.5% IN NEBU
2.5000 mg | INHALATION_SOLUTION | RESPIRATORY_TRACT | Status: AC
  Administered 2012-11-14: 2.5 mg via RESPIRATORY_TRACT
  Filled 2012-11-14 (×2): qty 0.5
  Filled 2012-11-14: qty 1

## 2012-11-14 MED ORDER — FLUTICASONE PROPIONATE 50 MCG/ACT NA SUSP
2.0000 | Freq: Every day | NASAL | Status: DC
  Administered 2012-11-15 – 2012-11-17 (×3): 2 via NASAL
  Filled 2012-11-14 (×2): qty 16

## 2012-11-14 MED ORDER — ONDANSETRON HCL 4 MG PO TABS: 4.0000 mg | ORAL_TABLET | Freq: Four times a day (QID) | ORAL | Status: DC | PRN

## 2012-11-14 MED ORDER — METHYLPREDNISOLONE SODIUM SUCC 125 MG IJ SOLR
125.0000 mg | Freq: Four times a day (QID) | INTRAMUSCULAR | Status: DC
  Administered 2012-11-14: 125 mg via INTRAVENOUS
  Filled 2012-11-14 (×2): qty 2

## 2012-11-14 MED ORDER — ZOLPIDEM TARTRATE 5 MG PO TABS: 5.0000 mg | ORAL_TABLET | Freq: Every evening | ORAL | Status: DC | PRN

## 2012-11-14 MED ORDER — METHYLPREDNISOLONE SODIUM SUCC 125 MG IJ SOLR: 80.0000 mg | Freq: Three times a day (TID) | INTRAMUSCULAR | Status: DC

## 2012-11-14 MED ORDER — WHITE PETROLATUM GEL
Status: AC
  Filled 2012-11-14: qty 5

## 2012-11-14 MED ORDER — IPRATROPIUM BROMIDE 0.02 % IN SOLN
0.5000 mg | RESPIRATORY_TRACT | Status: DC
  Administered 2012-11-14 (×3): 0.5 mg via RESPIRATORY_TRACT
  Filled 2012-11-14 (×4): qty 2.5

## 2012-11-14 MED ORDER — ALBUTEROL SULFATE (5 MG/ML) 0.5% IN NEBU: 2.5000 mg | INHALATION_SOLUTION | Freq: Four times a day (QID) | RESPIRATORY_TRACT | Status: DC

## 2012-11-14 MED ORDER — HYDROMORPHONE HCL PF 1 MG/ML IJ SOLN: 1.0000 mg | INTRAMUSCULAR | Status: DC | PRN

## 2012-11-14 MED ORDER — ALBUTEROL SULFATE (5 MG/ML) 0.5% IN NEBU: 2.5000 mg | INHALATION_SOLUTION | RESPIRATORY_TRACT | Status: DC

## 2012-11-14 MED ORDER — ADULT MULTIVITAMIN W/MINERALS CH
1.0000 | ORAL_TABLET | Freq: Every day | ORAL | Status: DC
  Administered 2012-11-15 – 2012-11-17 (×3): 1 via ORAL
  Filled 2012-11-14 (×3): qty 1

## 2012-11-14 MED ORDER — ALBUTEROL SULFATE (5 MG/ML) 0.5% IN NEBU
2.5000 mg | INHALATION_SOLUTION | Freq: Four times a day (QID) | RESPIRATORY_TRACT | Status: DC
  Administered 2012-11-14 – 2012-11-17 (×11): 2.5 mg via RESPIRATORY_TRACT
  Filled 2012-11-14 (×11): qty 0.5

## 2012-11-14 MED ORDER — ALBUTEROL SULFATE (5 MG/ML) 0.5% IN NEBU: 2.5000 mg | INHALATION_SOLUTION | RESPIRATORY_TRACT | Status: DC | PRN

## 2012-11-14 MED ORDER — INFLUENZA VIRUS VACC SPLIT PF IM SUSP
0.5000 mL | INTRAMUSCULAR | Status: AC
  Administered 2012-11-15: 0.5 mL via INTRAMUSCULAR
  Filled 2012-11-14: qty 0.5

## 2012-11-14 MED ORDER — GUAIFENESIN-CODEINE 100-10 MG/5ML PO SOLN
10.0000 mL | Freq: Four times a day (QID) | ORAL | Status: DC | PRN
  Administered 2012-11-14 – 2012-11-16 (×4): 10 mL via ORAL
  Filled 2012-11-14: qty 10
  Filled 2012-11-14 (×2): qty 5
  Filled 2012-11-14: qty 10
  Filled 2012-11-14: qty 50
  Filled 2012-11-14: qty 5

## 2012-11-14 MED ORDER — POTASSIUM CHLORIDE CRYS ER 20 MEQ PO TBCR
40.0000 meq | EXTENDED_RELEASE_TABLET | Freq: Two times a day (BID) | ORAL | Status: AC
  Administered 2012-11-14 – 2012-11-15 (×3): 40 meq via ORAL
  Filled 2012-11-14 (×3): qty 2

## 2012-11-14 MED ORDER — PNEUMOCOCCAL VAC POLYVALENT 25 MCG/0.5ML IJ INJ
0.5000 mL | INJECTION | INTRAMUSCULAR | Status: AC
  Administered 2012-11-15: 0.5 mL via INTRAMUSCULAR
  Filled 2012-11-14: qty 0.5

## 2012-11-14 MED ORDER — HYDROMORPHONE HCL PF 1 MG/ML IJ SOLN: 0.5000 mg | INTRAMUSCULAR | Status: DC | PRN

## 2012-11-14 NOTE — ED Provider Notes (Signed)
12:34 AM Patient with a hx sig for asthma was placed in CDU on wheeze protocol by PA Muthersbaugh .  Patient is here for SOB, wheexzing and has received 2 duonebs and 1 CAT.  . On exam: hemodynamically stable, NAD, heart w/ RRR, lungs CTAB, Chest & abd non-tender, no peripheral edema or calf tenderness.   Patient with diffuse inspiratory and expiratory wheezes and respiratory effort.  Believe patient will need admission for continued nebulizers and observation.  She has a previous history of admissions but no intubations.   I have spoken with Dr. Lovell Sheehan who has agreed to admit patient for obs and sxs reduction.   Arthor Captain, PA-C 11/14/12 2131  Arthor Captain, PA-C 11/14/12 2131

## 2012-11-14 NOTE — ED Provider Notes (Signed)
Medical screening examination/treatment/procedure(s) were performed by non-physician practitioner and as supervising physician I was immediately available for consultation/collaboration.   Hannahmarie Asberry B. Anirudh Baiz, MD 11/14/12 2249 

## 2012-11-14 NOTE — Progress Notes (Signed)
[facility] Progress Note [place] TEAM 1 - Stepdown/ICU TEAM   Shaylee Stanislawski ZOX:[id number] DOB: [birth year] DOA: 11/13/2012 PCP: Sheila Oats, MD  Brief narrative: 41 y.o. female with a history of Asthma who presents to the ED with complaints of worsening SOB and Wheezing along with Chest Congestion and productive Cough for the past 2 days. Her sputum has been yellow. She denies having myalgias or fevers or chills. She was given Nebulizer treatments and 60 mg of Prednisone X 1 in the ED but was not improving and was referred for medical admission.  Assessment/Plan:  Acute asthma exacerbation / status asthmaticus Continues to wheeze on exam   Morbid obesity  Hypokalemia Replace and follow   Code Status: FULL Disposition Plan: stable for transfer to tele bed  Consultants: None  Procedures: none  Antibiotics: Azithromycin 12/14>>  DVT prophylaxis: lovenox  HPI/Subjective: A f/u visit is completed   Objective: Blood pressure 120/64, pulse 87, temperature 99 F (37.2 C), temperature source Oral, resp. rate 20, height 5\' 6"  (1.676 m), weight 127.8 kg (281 lb 12 oz), last menstrual period 11/13/2012, SpO2 96.00%.  Intake/Output Summary (Last 24 hours) at 11/14/12 1411 Last data filed at 11/14/12 1000  Gross per 24 hour  Intake 1071.67 ml  Output      0 ml  Net 1071.67 ml     Exam: F/U exam completed  Data Reviewed: Basic Metabolic Panel:  Lab 11/14/12 8119  NA 140  K 3.3*  CL 106  CO2 22  GLUCOSE 225*  BUN 8  CREATININE 0.68  CALCIUM 8.8  MG --  PHOS --   CBC:  Lab 11/14/12 0542  WBC 6.9  NEUTROABS --  HGB 11.1*  HCT 33.7*  MCV 90.8  PLT 242    Recent Results (from the past 240 hour(s))  MRSA PCR SCREENING     Status: Normal   Collection Time   11/14/12  4:21 AM      Component Value Range Status Comment   MRSA by PCR NEGATIVE  NEGATIVE Final      Studies:  Recent x-ray studies have been reviewed in detail by the  Attending Physician  Scheduled Meds:  Reviewed in detail by the Attending Physician   Lonia Blood, MD [facility] Office  [phone] Pager [phone]  On-Call/Text Page:      [email or web address]      password [email or web address]  If 7PM-7AM, please contact night-coverage [email or web address] Password [email or web address] 11/14/2012, 2:11 PM   LOS: 1 day

## 2012-11-14 NOTE — H&P (Addendum)
[facility] History and Physical  Jon Kasparek ION:[id number] DOB: Feb 10, 1971 DOA: 11/13/2012  Referring physician: EDP PCP: Sheila Oats, MD  Specialists:   Chief Complaint: SOB  HPI: Jasmine Heath is a 41 y.o. female with a history of Asthma who presents to the ED with complaints of worsening SOB and Wheezing along with Chest Congestion and productive Cough for the past 2 days.  Her sputum has been yellow.  She denies having myalgias or fevers or chills.  She was given Nebulizer treatments and 60 mg of Prednisone X 1 in the ED but was not improving and was referred for medical admission.        Review of Systems: The patient denies anorexia, fever, weight loss, vision loss, decreased hearing, hoarseness, chest pain, syncope, peripheral edema, balance deficits, hemoptysis, abdominal pain, melena, hematochezia, severe indigestion/heartburn, hematuria, incontinence, dysuria, muscle weakness, suspicious skin lesions, transient blindness, difficulty walking, depression, unusual weight change, abnormal bleeding, enlarged lymph nodes, angioedema, and breast masses.    Past Medical History  Diagnosis Date  . Asthma    Past Surgical History  Procedure Date  . Foot surgery     I+D     Medications:  HOME MEDS: Prior to Admission medications   Medication Sig Start Date End Date Taking? Authorizing Provider  albuterol (PROVENTIL HFA;VENTOLIN HFA) 108 (90 BASE) MCG/ACT inhaler Inhale 1-2 puffs into the lungs every 6 (six) hours as needed for wheezing. 11/15/11 11/14/12 Yes April K Palumbo-Rasch, MD  mometasone (NASONEX) 50 MCG/ACT nasal spray Place 2 sprays into the nose daily as needed. For nasal congestion   Yes Historical Provider, MD  Multiple Vitamin (MULTIVITAMIN WITH MINERALS) TABS Take 1 tablet by mouth daily.   Yes Historical Provider, MD    Allergies:  No Known Allergies   Social History:  reports that she has been smoking.  She does not have any smokeless  tobacco history on file. She reports that she drinks alcohol. She reports that she does not use illicit drugs.    Family History  Problem Relation Age of Onset  . Asthma Mother      Physical Exam:  GEN:  Pleasant  41 year old Morbidly Obese African American Female examined and in mild distress; cooperative with exam Filed Vitals:   11/14/12 0039 11/14/12 0042 11/14/12 0042 11/14/12 0045  BP: 123/55   102/44  Pulse: 106 107  99  Temp:   99.8 F (37.7 C)   TempSrc:   Oral   Resp:      SpO2: 98% 98%  96%   Blood pressure 102/44, pulse 99, temperature 99.8 F (37.7 C), temperature source Oral, resp. rate 22, last menstrual period 11/13/2012, SpO2 96.00%. PSYCH: She is alert and oriented x4; does not appear anxious does not appear depressed; affect is normal HEENT: Normocephalic and Atraumatic, Mucous membranes pink; PERRLA; EOM intact; Fundi:  Benign;  No scleral icterus, Nares: Patent, Oropharynx: Clear, Fair Dentition, Neck:  FROM, no cervical lymphadenopathy nor thyromegaly or carotid bruit; no JVD; Breasts:: Not examined CHEST WALL: No tenderness CHEST: Normal respiration, clear to auscultation bilaterally HEART: Regular rate and rhythm; no murmurs rubs or gallops BACK: No kyphosis or scoliosis; no CVA tenderness ABDOMEN: Positive Bowel Sounds,  Obese, soft non-tender; no masses, no organomegaly. Rectal Exam: Not done EXTREMITIES: No bone or joint deformity; age-appropriate arthropathy of the hands and knees; no cyanosis, clubbing or edema; no ulcerations. Genitalia: not examined PULSES: 2+ and symmetric SKIN: Normal hydration no rash or ulceration CNS: Cranial nerves 2-12 grossly  intact no focal neurologic deficit    Labs on Admission:  Basic Metabolic Panel: No results found for this basename: NA:5,K:5,CL:5,CO2:5,GLUCOSE:5,BUN:5,CREATININE:5,CALCIUM:5,MG:5,PHOS:5 in the last 168 hours Liver Function Tests: No results found for this basename:  AST:5,ALT:5,ALKPHOS:5,BILITOT:5,PROT:5,ALBUMIN:5 in the last 168 hours No results found for this basename: LIPASE:5,AMYLASE:5 in the last 168 hours No results found for this basename: AMMONIA:5 in the last 168 hours CBC: No results found for this basename: WBC:5,NEUTROABS:5,HGB:5,HCT:5,MCV:5,PLT:5 in the last 168 hours Cardiac Enzymes: No results found for this basename: CKTOTAL:5,CKMB:5,CKMBINDEX:5,TROPONINI:5 in the last 168 hours  BNP (last 3 results) No results found for this basename: PROBNP:3 in the last 8760 hours CBG: No results found for this basename: GLUCAP:5 in the last 168 hours  Radiological Exams on Admission: Dg Chest 2 View  11/13/2012  *RADIOLOGY REPORT*  Clinical Data: Shortness of breath.  Right-sided chest pain and cough and congestion.  CHEST - 2 VIEW  Comparison: 10/19/2012  Findings: Heart size and pulmonary vascularity are normal and the lungs are clear.  No effusions.  No osseous abnormality.  IMPRESSION: Normal exam.   Original Report Authenticated By: Francene Boyers, M.D.        Assessment: Principal Problem:  *Status asthmaticus Active Problems:  SOB (shortness of breath)  Morbid obesity Hypokalemia  Plan:   Admit to Stepdown Bed High dose IV Steroid taper, 1 gram  IV Magnesium X 1 given for Bronchospam Albuterol with atrovent Nebs O2 PRN Azithromycin Replete K+ Reconcile Home Medications DVT prophylaxis    Code Status:  FULL CODE Family Communication:  N/A Disposition Plan:  Return to Home  Time spent: 14 Minutes  Ron Parker [facility] Pager [phone]  If 7PM-7AM, please contact night-coverage [email or web address] Password [facility] 11/14/2012, 2:34 AM

## 2012-11-14 NOTE — ED Notes (Signed)
Report given to [place], RN by Felipa Eth, RN.

## 2012-11-15 DIAGNOSIS — K029 Dental caries, unspecified: Secondary | ICD-10-CM

## 2012-11-15 DIAGNOSIS — D649 Anemia, unspecified: Secondary | ICD-10-CM

## 2012-11-15 DIAGNOSIS — E876 Hypokalemia: Secondary | ICD-10-CM

## 2012-11-15 LAB — CBC
HCT: 34.9 % — ABNORMAL LOW (ref 36.0–46.0)
RDW: 13.2 % (ref 11.5–15.5)
WBC: 12.8 10*3/uL — ABNORMAL HIGH (ref 4.0–10.5)

## 2012-11-15 MED ORDER — INSULIN ASPART 100 UNIT/ML ~~LOC~~ SOLN
0.0000 [IU] | Freq: Three times a day (TID) | SUBCUTANEOUS | Status: DC
  Administered 2012-11-15: 2 [IU] via SUBCUTANEOUS
  Administered 2012-11-16: 9 [IU] via SUBCUTANEOUS
  Administered 2012-11-16: 1 [IU] via SUBCUTANEOUS
  Administered 2012-11-16: 5 [IU] via SUBCUTANEOUS
  Administered 2012-11-17: 1 [IU] via SUBCUTANEOUS
  Administered 2012-11-17: 5 [IU] via SUBCUTANEOUS

## 2012-11-15 MED ORDER — METHYLPREDNISOLONE SODIUM SUCC 125 MG IJ SOLR
60.0000 mg | Freq: Two times a day (BID) | INTRAMUSCULAR | Status: DC
  Administered 2012-11-15 – 2012-11-16 (×2): 60 mg via INTRAVENOUS
  Filled 2012-11-15 (×4): qty 0.96

## 2012-11-15 NOTE — Progress Notes (Signed)
[facility] Progress Note    Jasmine Heath ZOX:[id number] DOB: [birth year] DOA: 11/13/2012 PCP: Sheila Oats, MD  Brief narrative: 41 y.o. female with a history of Asthma since childhood who presents to the ED with complaints of worsening SOB and Wheezing along with Chest Congestion and productive Cough for the past 2 days. Her sputum has been yellow. She denies having myalgias or fevers or chills. She was given Nebulizer treatments and 60 mg of Prednisone X 1 in the ED but was not improving and was referred for medical admission.  Assessment/Plan:  Acute asthma exacerbation / status asthmaticus  Patient was initially admitted to the step down unit and started empirically on IV Solu-Medrol, IV azithromycin and bronchodilator nebulizations.   Transferred to telemetry on 12/14.  Gradually improving. Patient indicates that her breathing is about 40% better. Still has dyspnea on exertion and intermittent wheezing.  Continue IV Solu-Medrol, azithromycin and monitor.  Dental pain  Caries of left lower second molar tooth.  Outpatient followup with dental surgery for extraction-patient missed appointment on 12/13 because she was hospitalized.  Hypokalemia  Replaced    Follow BMP in a.m.   Anemia  Chronic and stable.  Hyperglycemia  Likely related to steroids.  Check hemoglobin A1c.  Place on sliding scale insulin.  Morbid obesity  Code Status: FULL Disposition Plan: home when medically stable.  Family communication: Discussed with patient  Consultants: None  Procedures: none  Antibiotics: Azithromycin 12/14>>  DVT prophylaxis: lovenox  HPI/Subjective: Dyspnea continues to improve. Breathing is 40% better. Still has cough with intermittent brown sputum. Dyspnea on exertion. Intermittent wheezing. No chest pain.    Objective: Blood pressure 125/77, pulse 84, temperature 98.5 F (36.9 C), temperature source Oral, resp. rate 20, height 5\' 6"   (1.676 m), weight 127.8 kg (281 lb 12 oz), last menstrual period 11/13/2012, SpO2 95.00%.  Intake/Output Summary (Last 24 hours) at 11/15/12 1242 Last data filed at 11/15/12 8119  Gross per 24 hour  Intake   1670 ml  Output      0 ml  Net   1670 ml     Exam:  General exam: Comfortable. Able to speak in full sentences. Morbidly obese.  Respiratory system: Fair breath sounds but still has intermittent bilateral expiratory wheezing. No increased work of breathing.  Cardiovascular system: First and second heart sounds heard, regular rate and rhythm. No JVD, murmurs or pedal edema. Telemetry shows sinus rhythm.  Gastrointestinal system: Abdomen is nondistended, soft and nontender. Normal bowel sounds heard.  Central nervous system: Alert and oriented. No focal neurological deficits.  Extremities: Symmetric 5 x 5 power.   Data Reviewed: Basic Metabolic Panel:  Lab 11/14/12 1478  NA 140  K 3.3*  CL 106  CO2 22  GLUCOSE 225*  BUN 8  CREATININE 0.68  CALCIUM 8.8  MG --  PHOS --   CBC:  Lab 11/15/12 0426 11/14/12 0542  WBC 12.8* 6.9  NEUTROABS -- --  HGB 11.7* 11.1*  HCT 34.9* 33.7*  MCV 90.2 90.8  PLT 292 242    Recent Results (from the past 240 hour(s))  MRSA PCR SCREENING     Status: Normal   Collection Time   11/14/12  4:21 AM      Component Value Range Status Comment   MRSA by PCR NEGATIVE  NEGATIVE Final      Studies:  Recent x-ray studies have been reviewed in detail by the Attending Physician  Scheduled Meds:  Reviewed in detail by the Attending Physician  [facility] Office  [phone] Pager [phone]   On-Call/Text Page:      [email or web address]      password [email or web address]  If 7PM-7AM, please contact night-coverage [email or web address] Password [email or web address] 11/15/2012, 12:42 PM   LOS: 2 days

## 2012-11-16 LAB — BASIC METABOLIC PANEL
BUN: 11 mg/dL (ref 6–23)
Calcium: 9.5 mg/dL (ref 8.4–10.5)
Creatinine, Ser: 0.71 mg/dL (ref 0.50–1.10)
GFR calc Af Amer: >90 mL/min (ref >90)
GFR calc non Af Amer: >90 mL/min (ref >90)

## 2012-11-16 LAB — GLUCOSE, CAPILLARY
Glucose-Capillary: 278 mg/dL — ABNORMAL HIGH (ref 70–99)
Glucose-Capillary: 286 mg/dL — ABNORMAL HIGH (ref 70–99)

## 2012-11-16 MED ORDER — SENNA 8.6 MG PO TABS
2.0000 | ORAL_TABLET | Freq: Every day | ORAL | Status: DC
  Administered 2012-11-16 – 2012-11-17 (×2): 17.2 mg via ORAL
  Filled 2012-11-16 (×2): qty 2

## 2012-11-16 MED ORDER — POLYETHYLENE GLYCOL 3350 17 G PO PACK
17.0000 g | PACK | Freq: Every day | ORAL | Status: DC
  Administered 2012-11-16 – 2012-11-17 (×2): 17 g via ORAL
  Filled 2012-11-16 (×2): qty 1

## 2012-11-16 MED ORDER — BISACODYL 10 MG RE SUPP: 10.0000 mg | Freq: Every day | RECTAL | Status: DC | PRN

## 2012-11-16 MED ORDER — PREDNISONE 50 MG PO TABS
50.0000 mg | ORAL_TABLET | Freq: Every day | ORAL | Status: DC
  Administered 2012-11-16 – 2012-11-17 (×2): 50 mg via ORAL
  Filled 2012-11-16 (×3): qty 1

## 2012-11-16 MED ORDER — LIVING WELL WITH DIABETES BOOK
Freq: Once | Status: AC
  Administered 2012-11-16: 18:00:00
  Filled 2012-11-16: qty 1

## 2012-11-16 NOTE — Progress Notes (Signed)
[facility] Progress Note    Jasmine Heath ZOX:[id number] DOB: 1970/12/13 DOA: 11/13/2012 PCP: Sheila Oats, MD  Brief narrative: 41 y.o. female with a history of Asthma since childhood who presents to the ED with complaints of worsening SOB and Wheezing along with Chest Congestion and productive Cough for the past 2 days. Her sputum has been yellow. She denies having myalgias or fevers or chills. She was given Nebulizer treatments and 60 mg of Prednisone X 1 in the ED but was not improving and was referred for medical admission.  Assessment/Plan:  Acute asthma exacerbation / status asthmaticus  Patient was initially admitted to the step down unit and started empirically on IV Solu-Medrol, IV azithromycin and bronchodilator nebulizations.   Transferred to telemetry on 12/14.  Gradually improving. Patient indicates that her breathing continues to gradually improve but not yet at baseline.. Still has dyspnea on exertion.  Change steroid to PO. Continue Azithromycin.  Dental pain  Caries of left lower second molar tooth.  Outpatient followup with dental surgery for extraction-patient missed appointment on 12/13 because she was hospitalized.  Hypokalemia  Replaced    Anemia  Chronic and stable.  Hyperglycemia/ Newly diagnosed type 2 DM  Hemoglobin A1c: 6.3.  Placed on sliding scale insulin.  Trial of diet and weight loss as out patient.  Morbid obesity  Code Status: FULL Disposition Plan: home when medically stable- possibly 12/17.  Family communication: Discussed with patient  Consultants: None  Procedures: none  Antibiotics: Azithromycin 12/14>>  DVT prophylaxis: lovenox  HPI/Subjective: Dyspnea continues to gradually improve. Still has cough with intermittent brown sputum. Dyspnea on exertion.  Constipation   Objective: Blood pressure 125/85, pulse 76, temperature 98.2 F (36.8 C), temperature source Oral, resp. rate 19, height 5\' 6"   (1.676 m), weight 127.8 kg (281 lb 12 oz), last menstrual period 11/13/2012, SpO2 95.00%.  Intake/Output Summary (Last 24 hours) at 11/16/12 1027 Last data filed at 11/15/12 2033  Gross per 24 hour  Intake    240 ml  Output    500 ml  Net   -260 ml     Exam:  General exam: Comfortable. Able to speak in full sentences. Morbidly obese.  Respiratory system: Fair breath sounds with occasional wheezing. No increased work of breathing.  Cardiovascular system: First and second heart sounds heard, regular rate and rhythm. No JVD, murmurs or pedal edema. Telemetry shows sinus rhythm.  Gastrointestinal system: Abdomen is nondistended, soft and nontender. Normal bowel sounds heard.  Central nervous system: Alert and oriented. No focal neurological deficits.  Extremities: Symmetric 5 x 5 power.   Data Reviewed: Basic Metabolic Panel:  Lab 11/16/12 8119 11/14/12 0542  NA 139 140  K 4.2 3.3*  CL 104 106  CO2 27 22  GLUCOSE 163* 225*  BUN 11 8  CREATININE 0.71 0.68  CALCIUM 9.5 8.8  MG -- --  PHOS -- --   CBC:  Lab 11/15/12 0426 11/14/12 0542  WBC 12.8* 6.9  NEUTROABS -- --  HGB 11.7* 11.1*  HCT 34.9* 33.7*  MCV 90.2 90.8  PLT 292 242    Recent Results (from the past 240 hour(s))  MRSA PCR SCREENING     Status: Normal   Collection Time   11/14/12  4:21 AM      Component Value Range Status Comment   MRSA by PCR NEGATIVE  NEGATIVE Final      Studies:  Recent x-ray studies have been reviewed in detail by the Attending Physician  Scheduled Meds:  Reviewed  in detail by the Attending Physician   [facility] Office  [phone] Pager [phone]   On-Call/Text Page:      [email or web address]      password [email or web address]  If 7PM-7AM, please contact night-coverage [email or web address] Password [email or web address] 11/16/2012, 10:27 AM   LOS: 3 days

## 2012-11-16 NOTE — Progress Notes (Signed)
UR Completed.  Jasmine Heath [phone] 11/16/2012

## 2012-11-16 NOTE — Progress Notes (Addendum)
Inpatient Diabetes Program Recommendations  AACE/ADA: New Consensus Statement on Inpatient Glycemic Control (2013)  Target Ranges:  Prepandial:   less than 140 mg/dL      Peak postprandial:   less than 180 mg/dL (1-2 hours)      Critically ill patients:  140 - 180 mg/dL   Diabetes Coordinator spoke with patient concerning A1C=6.4 and lifestyle modification through diet and exercise.  Patient reports that she drinks soft drinks regularly.  We discussed basic dietary changes she could make to immediately help improve her glycemic control.  The patient said that she is ready to make changes for herself and for her children.  Encouraged patient to view the diabetes videos on the Patient Education Network.  Information for accessing the videos was left with the patient.  Multiple handouts for diabetes and meal planning were left with the patient.  A RD consult has also been placed for further education.  The patient was able to state using the teach back method one change she could make to have a big impact would be to quit drinking soft drinks.  She did not have any further questions/concerns at this time.  Thank you  Piedad Climes RN,BSN,CDE Inpatient Diabetes Coordinator [phone]   ADD: information for the ReliOn glucose meter was left with the patient.

## 2012-11-16 NOTE — Progress Notes (Signed)
Patient had a headache 9/10 and episode of coughing. Patient given prn guaifenesin and one oxycodone. Patient's coughing reduced and headache at 2/10. Will continue to monitor.

## 2012-11-17 DIAGNOSIS — E119 Type 2 diabetes mellitus without complications: Secondary | ICD-10-CM | POA: Diagnosis present

## 2012-11-17 MED ORDER — ALBUTEROL SULFATE HFA 108 (90 BASE) MCG/ACT IN AERS: 1.0000 | INHALATION_SPRAY | Freq: Four times a day (QID) | RESPIRATORY_TRACT | Status: DC | PRN

## 2012-11-17 MED ORDER — PREDNISONE 10 MG PO TABS: ORAL_TABLET | ORAL | Status: DC

## 2012-11-17 MED ORDER — SENNA 8.6 MG PO TABS: 2.0000 | ORAL_TABLET | Freq: Every day | ORAL | Status: DC

## 2012-11-17 MED ORDER — LORATADINE 10 MG PO TABS: 10.0000 mg | ORAL_TABLET | Freq: Every day | ORAL | Status: DC | PRN

## 2012-11-17 MED ORDER — ALBUTEROL SULFATE (5 MG/ML) 0.5% IN NEBU
INHALATION_SOLUTION | RESPIRATORY_TRACT | Status: AC
  Filled 2012-11-17: qty 0.5

## 2012-11-17 MED ORDER — POLYETHYLENE GLYCOL 3350 17 G PO PACK: 17.0000 g | PACK | Freq: Every day | ORAL | Status: DC

## 2012-11-17 MED ORDER — AZITHROMYCIN 500 MG PO TABS: 500.0000 mg | ORAL_TABLET | Freq: Every day | ORAL | Status: DC

## 2012-11-17 NOTE — Discharge Summary (Addendum)
Physician Discharge Summary  Jasmine Heath JYN:[id number] DOB: 07-Apr-1971 DOA: 11/13/2012  PCP: Sheila Oats, MD  Admit date: 11/13/2012 Discharge date: 11/17/2012  Time spent: Greater than 30 minutes.  Recommendations for Outpatient Follow-up:  1. With new PCP at [facility] urgent care Center/adult care clinic on 12/03/2012 at 10:30 AM  Discharge Diagnoses:  Principal Problem:  *Status asthmaticus Active Problems:  SOB (shortness of breath)  Morbid obesity  Hypokalemia  Anemia  Pain due to dental caries  Diabetes mellitus   Discharge Condition: Improved and stable.  Diet recommendation: Heart healthy and diabetic.  Filed Weights   11/14/12 0422  Weight: 127.8 kg (281 lb 12 oz)    History of present illness:  41 y.o. female with a history of Asthma since childhood who presents to the ED with complaints of worsening SOB and Wheezing along with Chest Congestion and productive Cough for the past 2 days. Her sputum has been yellow. She denies having myalgias or fevers or chills. She was given Nebulizer treatments and 60 mg of Prednisone X 1 in the ED but was not improving and was referred for medical admission.  Hospital Course:   Acute asthma exacerbation / status asthmaticus  Patient was initially admitted to the step down unit and started empirically on IV Solu-Medrol, IV azithromycin and bronchodilator nebulizations.  Transferred to telemetry on 12/14.  Has gradually improved. Patient indicates that she feels 90% better. Denies dyspnea on exertion. Minimal cough-mostly dry. No further antibiotics (completed 4 days of azithromycin IV). Will discharge on tapering dose of steroids and albuterol inhaler.  Dental pain  Caries of left lower second molar tooth.  Outpatient followup with dental surgery for extraction-patient missed appointment on 12/13 because she was hospitalized.  Hypokalemia  Replaced   Anemia  Chronic and stable.  Hyperglycemia/ Newly diagnosed type 2 DM   Hemoglobin A1c: 6.3.  Placed on sliding scale insulin in hospital. Improved after changing IV steroids to by mouth prednisone Trial of diet and weight loss as out patient. Diabetes education provided. Counseled regarding diet, exercise, weight loss and monitoring CBGs at home. Outpatient diabetes education requested. Currently hyperglycemic probably due to steroids. We'll taper steroids and monitor. Will not initiate any hypoglycemic agents at this time. This can be reevaluated when patient is off steroids at outpatient followup.  Morbid obesity   Consultants:  None   Procedures:  none   Antibiotics:  Azithromycin 12/14>> 12/17  Discharge Exam:  Complaints Indicates that breathing is almost at baseline. Denies dyspnea at rest or on exertion. Constipation-chronic. Passing flatus. No nausea, vomiting or abdominal pain.  Filed Vitals:   11/16/12 1936 11/16/12 2140 11/17/12 0502 11/17/12 0846  BP: 123/75  122/65   Pulse: 87 72 65   Temp: 98.7 F (37.1 C)  98 F (36.7 C)   TempSrc: Oral  Oral   Resp: 20 18 16    Height:      Weight:      SpO2: 94%  95% 95%    General exam: Comfortable. Able to speak in full sentences. Morbidly obese.  Respiratory system: Clear to auscultation. No increased work of breathing.  Cardiovascular system: First and second heart sounds heard, regular rate and rhythm. No JVD, murmurs or pedal edema. Telemetry shows sinus rhythm.  Gastrointestinal system: Abdomen is nondistended, soft and nontender. Normal bowel sounds heard.  Central nervous system: Alert and oriented. No focal neurological deficits.  Extremities: Symmetric 5 x 5 power.   Discharge Instructions      Discharge Orders  Future Appointments: Provider: Department: Dept Phone: Center:   12/25/2012 9:15 AM Fuller Plan [place], RD [facility] [phone] NDM     Future Orders Please Complete By Expires   Ambulatory referral to Nutrition and  Diabetic Education      Comments:   New onset DM   Diet - low sodium heart healthy      Diet Carb Modified      Increase activity slowly      Call MD for:  difficulty breathing, headache or visual disturbances          Medication List     As of 11/17/2012 12:06 PM    TAKE these medications         albuterol 108 (90 BASE) MCG/ACT inhaler   Commonly known as: PROVENTIL HFA;VENTOLIN HFA   Inhale 1-2 puffs into the lungs every 6 (six) hours as needed for wheezing or shortness of breath.      loratadine 10 MG tablet   Commonly known as: CLARITIN   Take 1 tablet (10 mg total) by mouth daily as needed for allergies.      mometasone 50 MCG/ACT nasal spray   Commonly known as: NASONEX   Place 2 sprays into the nose daily as needed. For nasal congestion      multivitamin with minerals Tabs   Take 1 tablet by mouth daily.      polyethylene glycol packet   Commonly known as: MIRALAX / GLYCOLAX   Take 17 g by mouth daily.      predniSONE 10 MG tablet   Commonly known as: DELTASONE   Take 4 tablets daily for 3 days, then 3 tablets daily for 3 days, then 2 tablets daily for 3 days, then 1 tablet daily for 3 days, then stop.      senna 8.6 MG Tabs   Commonly known as: SENOKOT   Take 2 tablets (17.2 mg total) by mouth daily.         Follow-up Information    Follow up with [facility]. On 12/03/2012. (10:30 am   Adult Care Clinic)    Contact information:   [address] [place] [address] Adult Care Clinic  [phone]            The results of significant diagnostics from this hospitalization (including imaging, microbiology, ancillary and laboratory) are listed below for reference.    Significant Diagnostic Studies: Dg Chest 2 View  11/13/2012  *RADIOLOGY REPORT*  Clinical Data: Shortness of breath.  Right-sided chest pain and cough and congestion.  CHEST - 2 VIEW  Comparison: 10/19/2012  Findings: Heart size and pulmonary vascularity are normal and  the lungs are clear.  No effusions.  No osseous abnormality.  IMPRESSION: Normal exam.   Original Report Authenticated By: Francene Boyers, M.D.     Microbiology: Recent Results (from the past 240 hour(s))  MRSA PCR SCREENING     Status: Normal   Collection Time   11/14/12  4:21 AM      Component Value Range Status Comment   MRSA by PCR NEGATIVE  NEGATIVE Final      Labs: Basic Metabolic Panel:  Lab 11/16/12 3086 11/14/12 0542  NA 139 140  K 4.2 3.3*  CL 104 106  CO2 27 22  GLUCOSE 163* 225*  BUN 11 8  CREATININE 0.71 0.68  CALCIUM 9.5 8.8  MG -- --  PHOS -- --   Liver Function Tests: No results found  for this basename: AST:5,ALT:5,ALKPHOS:5,BILITOT:5,PROT:5,ALBUMIN:5 in the last 168 hours No results found for this basename: LIPASE:5,AMYLASE:5 in the last 168 hours No results found for this basename: AMMONIA:5 in the last 168 hours CBC:  Lab 11/15/12 0426 11/14/12 0542  WBC 12.8* 6.9  NEUTROABS -- --  HGB 11.7* 11.1*  HCT 34.9* 33.7*  MCV 90.2 90.8  PLT 292 242   Cardiac Enzymes: No results found for this basename: CKTOTAL:5,CKMB:5,CKMBINDEX:5,TROPONINI:5 in the last 168 hours BNP: BNP (last 3 results) No results found for this basename: PROBNP:3 in the last 8760 hours CBG:  Lab 11/17/12 1114 11/17/12 0554 11/16/12 2120 11/16/12 1629 11/16/12 1117  GLUCAP 289* 143* 286* 394* 278*    Other lab data  Hemoglobin A1c: 6.4.  TSH: 0.965   Signed:  HONGALGI,ANAND  [facility] 11/17/2012, 12:06 PM

## 2012-11-17 NOTE — Progress Notes (Signed)
Discharge instructions given to pt along with prescriptions. Pt is stable for discharge. Gave pt note from MD to excuse her from work. Pt also received match medication assistance card from CM to help her receive her prescriptions for free. Pt feels comfortable with her diabetes education. Will provide wheelchair to escort pt to her ride home

## 2012-11-17 NOTE — Care Management Note (Signed)
    Page 1 of 1   11/17/2012     1:55:54 PM   CARE MANAGEMENT NOTE 11/17/2012  Patient:  [facility]   Account Number:  [id number]  Date Initiated:  11/16/2012  Documentation initiated by:  Avie Arenas  Subjective/Objective Assessment:   Asthma - Sirs.     Action/Plan:   Anticipated DC Date:  11/17/2012   Anticipated DC Plan:  HOME W HOME HEALTH SERVICES      DC Planning Services  CM consult  [facility]  [facility] Program  PCP issues      Choice offered to / List presented to:             Status of service:  Completed, signed off Medicare Important Message given?   (If response is "NO", the following Medicare IM given date fields will be blank) Date Medicare IM given:   Date Additional Medicare IM given:    Discharge Disposition:  HOME/SELF CARE  Per UR Regulation:  Reviewed for med. necessity/level of care/duration of stay  If discussed at Long Length of Stay Meetings, dates discussed:    Comments:  ContactAcey Lav Mother [phone]  11/17/12 Rosalita Chessman [phone] PT FOR DC HOME TODAY.  PT ELIGIBLE FOR [facility]ATCH PROGRAM. LETTER GIVEN WITH EXPLANATION OF PROGRAM BENEFITS.  PT HAS NO PCP:  APPT MADE AT [facility] FOR JANUARY 2ND AT 10:30.  PT GIVEN INFORMATION ON RELI-ON METER AT [facility]  SHE STATES SHE CAN AFFORD THIS, AND PLANS TO PURCHASE ONE ASAP.

## 2012-11-17 NOTE — Plan of Care (Signed)
Problem: Food- and Nutrition-Related Knowledge Deficit (NB-1.1) Goal: Nutrition education Formal process to instruct or train a patient/client in a skill or to impart knowledge to help patients/clients voluntarily manage or modify food choices and eating behavior to maintain or improve health.  Outcome: Completed/Met Date Met:  11/17/12  RD consulted for nutrition education regarding diabetes.     Lab Results  Component Value Date    HGBA1C 6.4* 11/16/2012    RD provided "Carbohydrate Counting for People with Diabetes" handout from the Academy of Nutrition and Dietetics. common foods. Reviewed guidelines and recommendations.  Per diet recall, patient consuming high amounts of regular soda and sweets; works at a [employer].  RD recommended patient to bring her own lunch; patient amenable.  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  Expect fair compliance.  Body mass index is 45.48 kg/(m^2). Pt meets criteria for Obesity Class III based on current BMI.  Current diet order is Carbohydrate Modified Medium Calorie, patient is consuming approximately 100% of meals at this time. Labs and medications reviewed. No further nutrition interventions warranted at this time. Please re-consult RD as needed.  Kirkland Hun, RD, LDN Pager #: [phone] After-Hours Pager #: [phone]

## 2012-12-12 IMAGING — CR DG CHEST 2V
2 series · 2 of 2 positions shown · non-contrast
Comparison: 07/18/2012

CLINICAL DATA: Chest pain for 2 days

CHEST - 2 VIEW

[w chest pa]
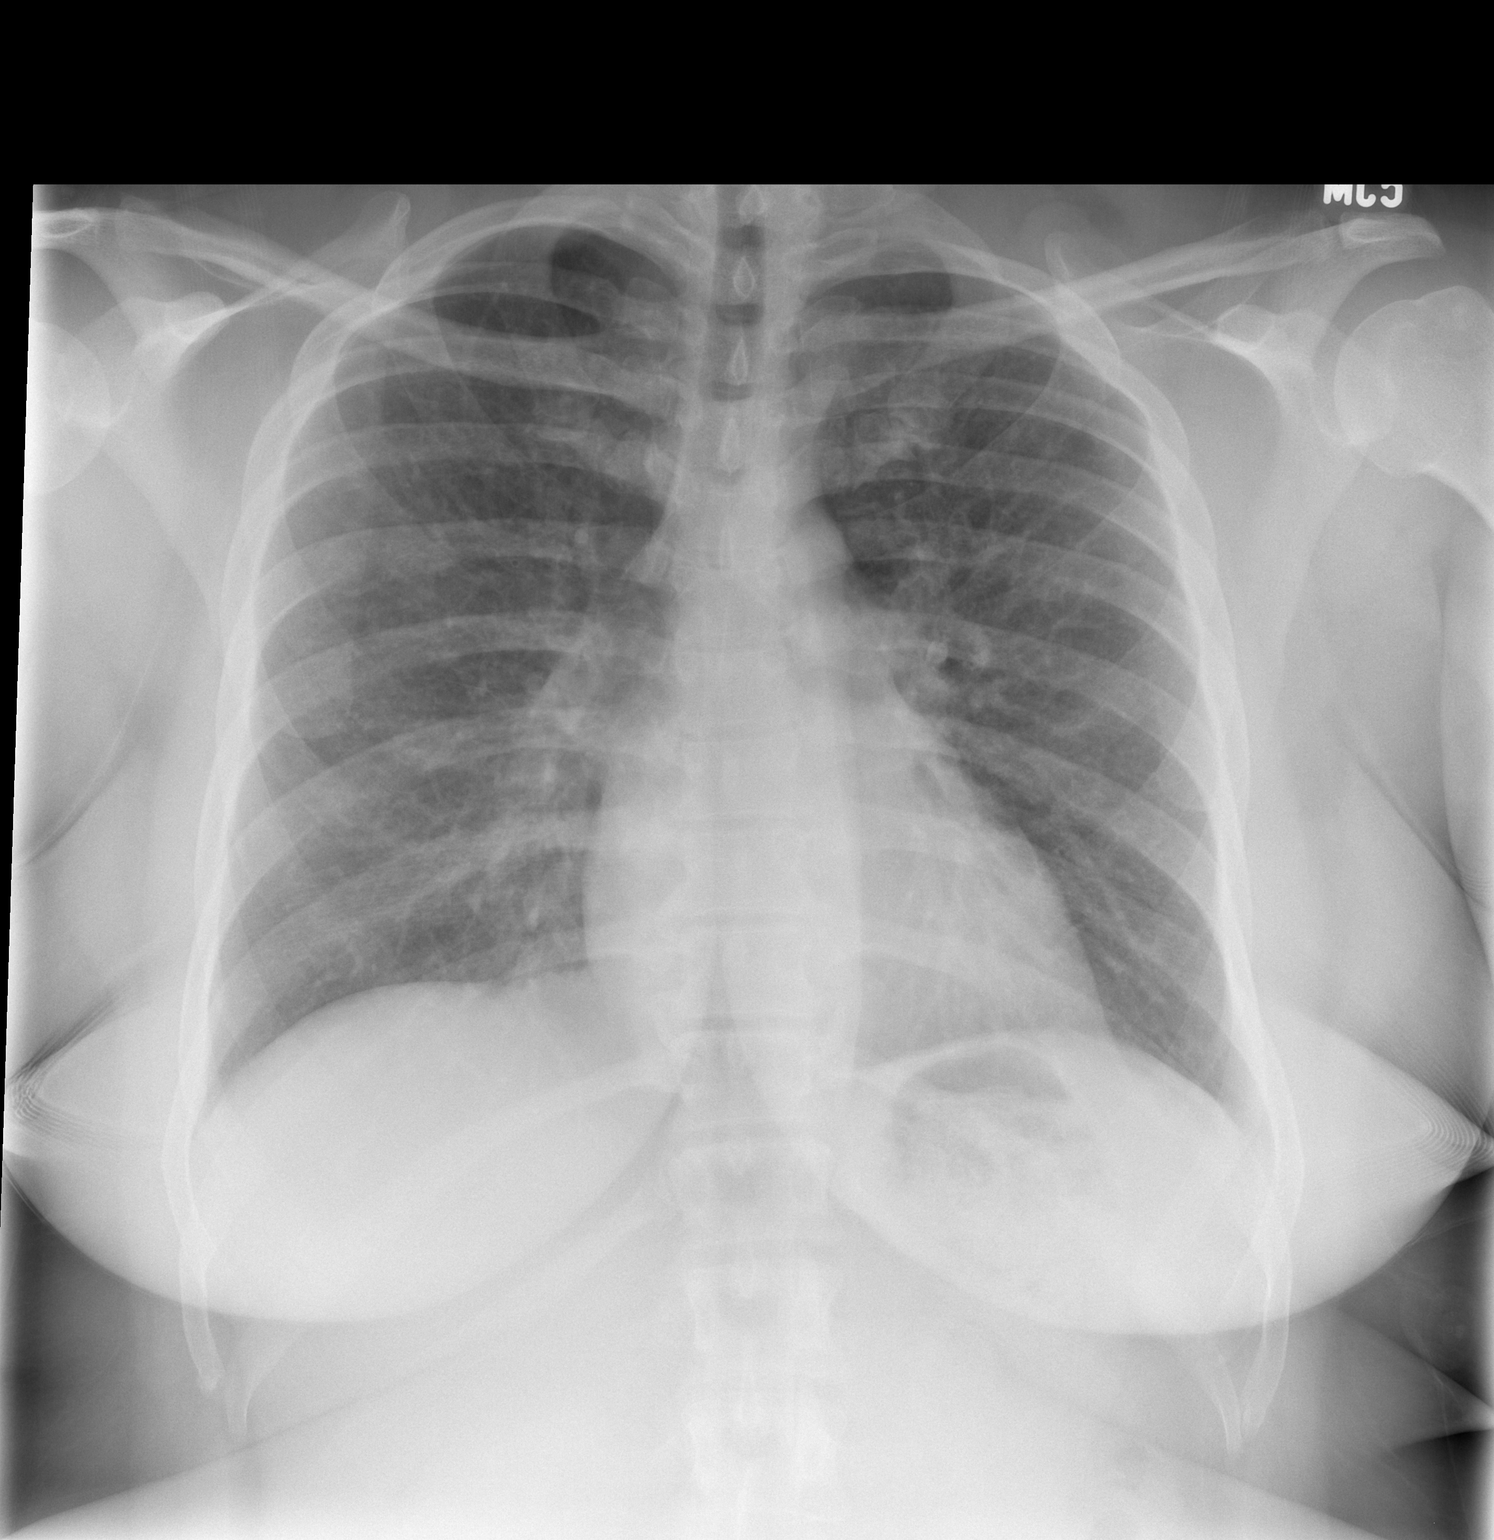

[w chest lat]
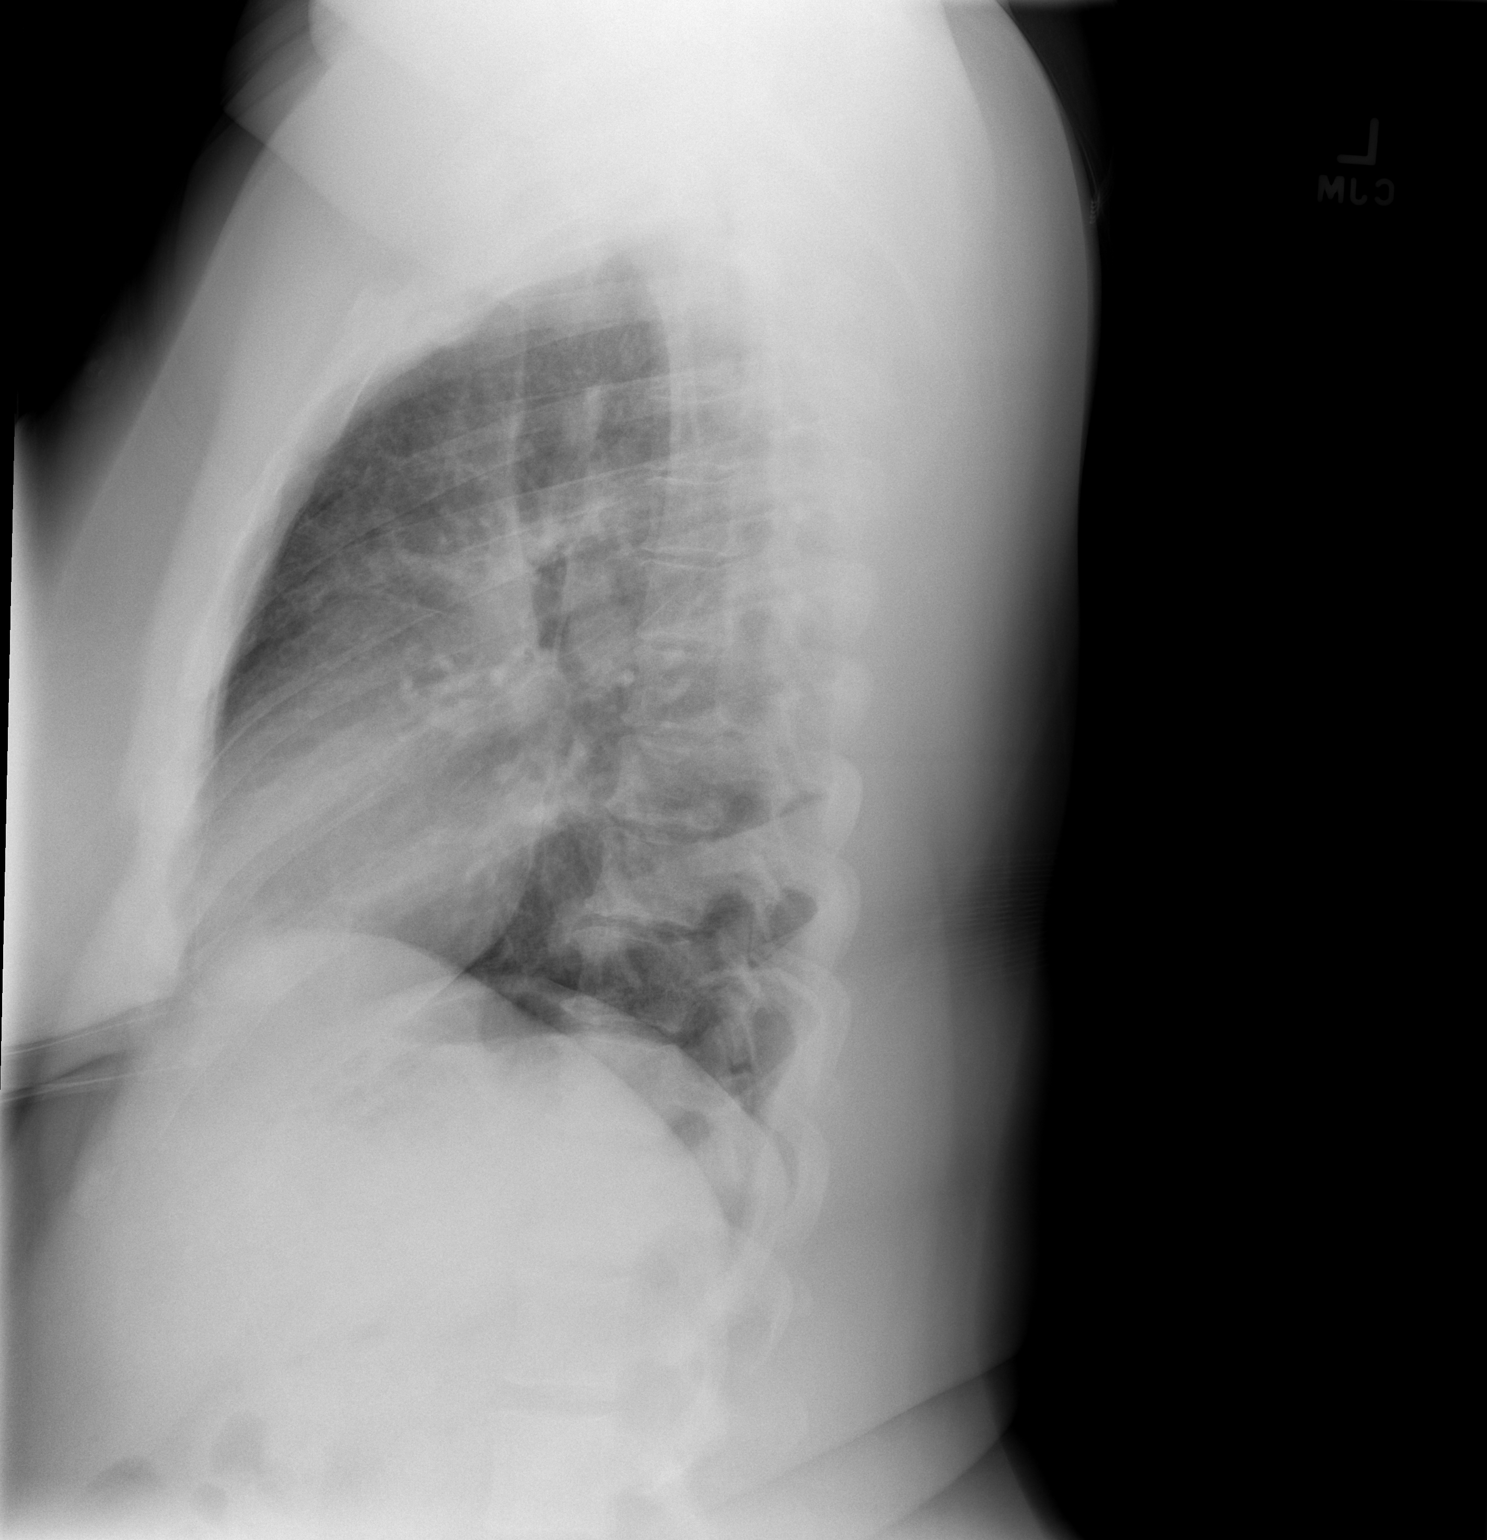

[2 of 2 positions shown; findings below may reference images not displayed]

FINDINGS: The heart and pulmonary vascularity are within normal
limits.  The lungs are clear bilaterally.  No focal infiltrate or
sizable effusion is seen.
IMPRESSION: No acute abnormality is seen.

## 2012-12-25 ENCOUNTER — Ambulatory Visit: Payer: Self-pay | Admitting: Dietician

## 2013-01-22 ENCOUNTER — Ambulatory Visit: Payer: Self-pay | Admitting: Dietician

## 2013-02-08 ENCOUNTER — Encounter: Payer: Self-pay | Admitting: Dietician

## 2013-02-08 ENCOUNTER — Encounter: Payer: Self-pay | Attending: Internal Medicine | Admitting: Dietician

## 2013-02-08 VITALS — Ht 67.0 in | Wt 283.4 lb

## 2013-02-08 DIAGNOSIS — Z713 Dietary counseling and surveillance: Secondary | ICD-10-CM | POA: Insufficient documentation

## 2013-02-08 DIAGNOSIS — E119 Type 2 diabetes mellitus without complications: Secondary | ICD-10-CM | POA: Insufficient documentation

## 2013-02-08 NOTE — Progress Notes (Signed)
Medical Nutrition Therapy:  Appt start time: 1100 end time:  1200.  Assessment:  Primary concerns today: DM type II.   MEDICATIONS: see list.   DIETARY INTAKE:  Usual eating pattern includes 2 meals and 2 snacks per day.  Everyday foods include some fast foods, broccoli.  Avoided foods include none.    24-hr recall:  B ( AM): has recently started trying to eat breakfast- banana, bowl of cheerios with milk  Snk ( AM): none  L ( PM): usually a salad from work at [employer] or chix nuggets or burger with half bun Snk ( PM): peanut butter crackers D ( PM): recently making more attempts to cook at home on foreman grill, no fried meats, mostly chix or pork chop with corn and/or potatoes, broccoli Snk ( PM): dessert item of some kind, such as [employer] frosty Beverages: recently cut out sodas and sweet drinks  Usual physical activity: On feet for work at [employer] and [employer], but nothing outside. Recently joined gym.  Pt is willing and to make changes to diet and lifestyle. She is enthusiastic about correctly carb counting, as she realizes she has not been doing so properly since her dx. She claims giving up the mindless eating and large amount of sweets she used to eat will be difficult, but she has already made significant advances in that vein since the dx.   Pt seems to be overeating fruit a bit as a replacement for sweet foods she used to eat.   Progress Towards Goal(s):  In progress.   Nutritional Diagnosis:  NI-5.8.2 Excessive carbohydrate intake As related to recent type II DM dx.  As evidenced by misunderstanding of CHO counting system, reported intake of sweet foods, crackers at regular intervals.    Intervention:  Nutrition counseling provided regarding DM. Pathophysiology was touched upon, and how to reverse the trend through diet and PA. In particular, how to identify a CHO choice was emphasized. Set number of CHO choices for each meal were assigned (2-3 for B, L, D, plus 1 for  afternoon snack, and 2 for HS snack). Planning a dinner plate using the Plate Method was demonstrated. RD also emphasized importance of PA as an independent mechanism to incorporate blood glucose into the cell. RD assigned pt to 40 minutes PA per day 6 days per week. Modalities of PA were also discussed.   Because pt works at [employer] and [employer] 60 hours per week, best choices off those menus and forming a full meal from those menus was discussed.   Handouts given during visit include:  Diabetes handbook  Plate Method  Monitoring/Evaluation:  Dietary intake, exercise, portion control, CHO counting, and body weight in 4 week(s).

## 2013-03-12 ENCOUNTER — Ambulatory Visit: Payer: Self-pay | Admitting: Dietician

## 2013-05-09 ENCOUNTER — Emergency Department (HOSPITAL_COMMUNITY)
Admission: EM | Admit: 2013-05-09 | Discharge: 2013-05-10 | Disposition: A | Discharge status: Home / Self Care | Payer: Medicaid Other | Attending: Emergency Medicine | Admitting: Emergency Medicine

## 2013-05-09 ENCOUNTER — Encounter (HOSPITAL_COMMUNITY): Payer: Self-pay | Admitting: Emergency Medicine

## 2013-05-09 ENCOUNTER — Emergency Department (HOSPITAL_COMMUNITY): Payer: Medicaid Other

## 2013-05-09 DIAGNOSIS — Z87891 Personal history of nicotine dependence: Secondary | ICD-10-CM | POA: Insufficient documentation

## 2013-05-09 DIAGNOSIS — Z79899 Other long term (current) drug therapy: Secondary | ICD-10-CM | POA: Insufficient documentation

## 2013-05-09 DIAGNOSIS — K029 Dental caries, unspecified: Secondary | ICD-10-CM | POA: Insufficient documentation

## 2013-05-09 DIAGNOSIS — R062 Wheezing: Secondary | ICD-10-CM | POA: Insufficient documentation

## 2013-05-09 DIAGNOSIS — J45901 Unspecified asthma with (acute) exacerbation: Secondary | ICD-10-CM | POA: Insufficient documentation

## 2013-05-09 LAB — CBC
HCT: 36.8 % (ref 36.0–46.0)
MCH: 30.4 pg (ref 26.0–34.0)
MCV: 88.7 fL (ref 78.0–100.0)
Platelets: 238 10*3/uL (ref 150–400)
RBC: 4.15 MIL/uL (ref 3.87–5.11)
RDW: 13.2 % (ref 11.5–15.5)

## 2013-05-09 LAB — BASIC METABOLIC PANEL
BUN: 11 mg/dL (ref 6–23)
CO2: 27 mEq/L (ref 19–32)
Calcium: 9.4 mg/dL (ref 8.4–10.5)
Chloride: 105 mEq/L (ref 96–112)
Creatinine, Ser: 0.86 mg/dL (ref 0.50–1.10)

## 2013-05-09 LAB — POCT I-STAT TROPONIN I: Troponin i, poc: 0.01 ng/mL (ref 0.00–0.08)

## 2013-05-09 MED ORDER — PENICILLIN V POTASSIUM 250 MG PO TABS
500.0000 mg | ORAL_TABLET | Freq: Once | ORAL | Status: AC
  Administered 2013-05-09: 500 mg via ORAL
  Filled 2013-05-09: qty 2

## 2013-05-09 MED ORDER — ALBUTEROL SULFATE HFA 108 (90 BASE) MCG/ACT IN AERS: 1.0000 | INHALATION_SPRAY | Freq: Four times a day (QID) | RESPIRATORY_TRACT | Status: DC | PRN

## 2013-05-09 MED ORDER — PENICILLIN V POTASSIUM 500 MG PO TABS: 500.0000 mg | ORAL_TABLET | Freq: Four times a day (QID) | ORAL | Status: AC

## 2013-05-09 MED ORDER — PREDNISONE 20 MG PO TABS: ORAL_TABLET | ORAL | Status: DC

## 2013-05-09 MED ORDER — HYDROCODONE-ACETAMINOPHEN 5-325 MG PO TABS: 1.0000 | ORAL_TABLET | Freq: Four times a day (QID) | ORAL | Status: DC | PRN

## 2013-05-09 MED ORDER — OXYCODONE-ACETAMINOPHEN 5-325 MG PO TABS
1.0000 | ORAL_TABLET | Freq: Once | ORAL | Status: AC
  Administered 2013-05-09: 1 via ORAL
  Filled 2013-05-09: qty 1

## 2013-05-09 MED ORDER — ALBUTEROL SULFATE (5 MG/ML) 0.5% IN NEBU
5.0000 mg | INHALATION_SOLUTION | Freq: Once | RESPIRATORY_TRACT | Status: AC
  Administered 2013-05-09: 5 mg via RESPIRATORY_TRACT
  Filled 2013-05-09: qty 1

## 2013-05-09 MED ORDER — PREDNISONE 20 MG PO TABS
60.0000 mg | ORAL_TABLET | Freq: Once | ORAL | Status: AC
  Administered 2013-05-09: 60 mg via ORAL
  Filled 2013-05-09: qty 3

## 2013-05-09 NOTE — ED Notes (Signed)
CP, SOB starting today. Patient states "a toothache started my asthma today". Patient does have handheld nebulizer "I've used that 10 times today". Secondary complaint lower right side tooth hurt for two weeks now

## 2013-05-09 NOTE — ED Provider Notes (Signed)
History     CSN: [id number]  Arrival date & time 05/09/13  1933   First MD Initiated Contact with Patient 05/09/13 2303      Chief Complaint  Patient presents with  . Shortness of Breath  . Dental Pain    (Consider location/radiation/quality/duration/timing/severity/associated sxs/prior treatment) Patient is a 42 y.o. female presenting with shortness of breath and tooth pain. The history is provided by the patient.  Shortness of Breath Severity:  Moderate Onset quality:  Gradual Duration:  1 day Timing:  Constant Progression:  Unchanged Chronicity:  Recurrent Context: smoke exposure   Relieved by:  Nothing Worsened by:  Nothing tried Ineffective treatments:  None tried Associated symptoms: wheezing   Associated symptoms: no fever and no neck pain   Wheezing:    Severity:  Moderate   Onset quality:  Gradual   Duration:  1 day   Timing:  Constant   Progression:  Unchanged   Chronicity:  Recurrent Risk factors: no oral contraceptive use   Risk factors comment:  Asthma Dental Pain Location:  Lower Lower teeth location:  29/RL 2nd bicuspid Severity:  Severe Onset quality:  Gradual Timing:  Constant Progression:  Unchanged Chronicity:  New Relieved by:  Nothing Worsened by:  Nothing tried Ineffective treatments:  None tried Associated symptoms: no facial swelling, no fever and no neck pain   Risk factors: no smoking     Past Medical History  Diagnosis Date  . Asthma     Past Surgical History  Procedure Laterality Date  . Foot surgery      I+D    Family History  Problem Relation Age of Onset  . Asthma Mother     History  Substance Use Topics  . Smoking status: Former Smoker    Quit date: 07/05/2012  . Smokeless tobacco: Not on file  . Alcohol Use: Yes     Comment: occ beer and liquor    OB History   Grav Para Term Preterm Abortions TAB SAB Ect Mult Living                  Review of Systems  Constitutional: Negative for fever.  HENT:  Negative for facial swelling and neck pain.   Respiratory: Positive for shortness of breath and wheezing.   All other systems reviewed and are negative.    Allergies  Review of patient's allergies indicates no known allergies.  Home Medications   Current Outpatient Rx  Name  Route  Sig  Dispense  Refill  . albuterol (PROVENTIL HFA;VENTOLIN HFA) 108 (90 BASE) MCG/ACT inhaler   Inhalation   Inhale 1-2 puffs into the lungs every 6 (six) hours as needed for wheezing or shortness of breath.   1 Inhaler   1   . mometasone (NASONEX) 50 MCG/ACT nasal spray   Nasal   Place 2 sprays into the nose daily as needed. For nasal congestion           BP 117/81  Pulse 78  Resp 22  SpO2 98%  LMP 04/18/2013  Physical Exam  Constitutional: She is oriented to person, place, and time. She appears well-developed and well-nourished. No distress.  HENT:  Head: Normocephalic and atraumatic.  Mouth/Throat: Oropharynx is clear and moist.    Eyes: Conjunctivae are normal. Pupils are equal, round, and reactive to light.  Neck: Normal range of motion. Neck supple.  Cardiovascular: Normal rate, regular rhythm and intact distal pulses.   Pulmonary/Chest: Effort normal and breath sounds normal. No stridor. No  respiratory distress. She has no wheezes. She has no rales. She exhibits no tenderness.  Abdominal: Soft. Bowel sounds are normal. There is no tenderness. There is no rebound and no guarding.  Musculoskeletal: Normal range of motion.  Neurological: She is alert and oriented to person, place, and time.  Skin: Skin is warm and dry.  Psychiatric: She has a normal mood and affect.    ED Course  Procedures (including critical care time)  Labs Reviewed  BASIC METABOLIC PANEL - Abnormal; Notable for the following:    Glucose, Bld 114 (*)    GFR calc non Af Amer 84 (*)    All other components within normal limits  CBC  POCT I-STAT TROPONIN I   Dg Chest 2 View  05/09/2013   *RADIOLOGY REPORT*   Clinical Data: Chest pressure, congestion and shortness of breath. History of asthma.  CHEST - 2 VIEW  Comparison: 11/13/2012  Findings: Chronic bronchial and interstitial prominence present consistent with some component of chronic lung disease.  No focal infiltrate, edema or pleural fluid is identified. The heart size and mediastinal contours are within normal limits.  Bony thorax is unremarkable.  IMPRESSION: Chronic bronchial and interstitial prominence.   Original Report Authenticated By: Irish Lack, M.D.     No diagnosis found.    MDM  Asthma and dental caries.  Wheezing resolved when saw patient.  Feels improved will d/c with new inhaler         Ajeet Casasola K Joniyah Mallinger-Rasch, MD 05/09/13 2325

## 2013-05-09 NOTE — ED Notes (Addendum)
Patient endorses increasing SOB. VSS. Patient is sitting in wheelchair, no labored respirations Will proceed with protocol albuterol

## 2013-05-09 NOTE — ED Notes (Signed)
Patient also endorses increasing tooth pain 8/10

## 2013-05-13 ENCOUNTER — Telehealth (HOSPITAL_COMMUNITY): Payer: Self-pay | Admitting: Emergency Medicine

## 2013-05-13 NOTE — ED Notes (Signed)
Patient called and stated she had lost her Rx for penicillin and needed another called in. Informed her we could call in the remaining medication but that insurance probably would not cover it. Rx for Penicillin for 36 tablets called in by Jaci Lazier PFM.

## 2014-03-24 ENCOUNTER — Emergency Department (HOSPITAL_COMMUNITY)
Admission: EM | Admit: 2014-03-24 | Discharge: 2014-03-24 | Disposition: A | Discharge status: Home / Self Care | Payer: Medicaid - Out of State | Attending: Emergency Medicine | Admitting: Emergency Medicine

## 2014-03-24 ENCOUNTER — Encounter (HOSPITAL_COMMUNITY): Payer: Self-pay | Admitting: Emergency Medicine

## 2014-03-24 DIAGNOSIS — Z87891 Personal history of nicotine dependence: Secondary | ICD-10-CM | POA: Insufficient documentation

## 2014-03-24 DIAGNOSIS — Z8701 Personal history of pneumonia (recurrent): Secondary | ICD-10-CM | POA: Diagnosis not present

## 2014-03-24 DIAGNOSIS — J45901 Unspecified asthma with (acute) exacerbation: Secondary | ICD-10-CM | POA: Diagnosis not present

## 2014-03-24 DIAGNOSIS — Z79899 Other long term (current) drug therapy: Secondary | ICD-10-CM | POA: Diagnosis not present

## 2014-03-24 DIAGNOSIS — R0602 Shortness of breath: Secondary | ICD-10-CM | POA: Diagnosis present

## 2014-03-24 MED ORDER — IPRATROPIUM-ALBUTEROL 0.5-2.5 (3) MG/3ML IN SOLN
3.0000 mL | Freq: Once | RESPIRATORY_TRACT | Status: AC
  Administered 2014-03-24: 3 mL via RESPIRATORY_TRACT
  Filled 2014-03-24: qty 3

## 2014-03-24 MED ORDER — PREDNISONE 10 MG PO TABS: 20.0000 mg | ORAL_TABLET | Freq: Every day | ORAL | Status: DC

## 2014-03-24 MED ORDER — PREDNISONE 20 MG PO TABS
60.0000 mg | ORAL_TABLET | Freq: Once | ORAL | Status: AC
  Administered 2014-03-24: 60 mg via ORAL
  Filled 2014-03-24: qty 3

## 2014-03-24 NOTE — Discharge Instructions (Signed)
Asthma, Acute Bronchospasm °Acute bronchospasm caused by asthma is also referred to as an asthma attack. Bronchospasm means your air passages become narrowed. The narrowing is caused by inflammation and tightening of the muscles in the air tubes (bronchi) in your lungs. This can make it hard to breath or cause you to wheeze and cough. °CAUSES °Possible triggers are: °· Animal dander from the skin, hair, or feathers of animals. °· Dust mites contained in house dust. °· Cockroaches. °· Pollen from trees or grass. °· Mold. °· Cigarette or tobacco smoke. °· Air pollutants such as dust, household cleaners, hair sprays, aerosol sprays, paint fumes, strong chemicals, or strong odors. °· Cold air or weather changes. Cold air may trigger inflammation. Winds increase molds and pollens in the air. °· Strong emotions such as crying or laughing hard. °· Stress. °· Certain medicines such as aspirin or beta-blockers. °· Sulfites in foods and drinks, such as dried fruits and wine. °· Infections or inflammatory conditions, such as a flu, cold, or inflammation of the nasal membranes (rhinitis). °· Gastroesophageal reflux disease (GERD). GERD is a condition where stomach acid backs up into your throat (esophagus). °· Exercise or strenuous activity. °SIGNS AND SYMPTOMS  °· Wheezing. °· Excessive coughing, particularly at night. °· Chest tightness. °· Shortness of breath. °DIAGNOSIS  °Your health care provider will ask you about your medical history and perform a physical exam. A chest X-ray or blood testing may be performed to look for other causes of your symptoms or other conditions that may have triggered your asthma attack.  °TREATMENT  °Treatment is aimed at reducing inflammation and opening up the airways in your lungs.  Most asthma attacks are treated with inhaled medicines. These include quick relief or rescue medicines (such as bronchodilators) and controller medicines (such as inhaled corticosteroids). These medicines are  sometimes given through an inhaler or a nebulizer. Systemic steroid medicine taken by mouth or given through an IV tube also can be used to reduce the inflammation when an attack is moderate or severe. Antibiotic medicines are only used if a bacterial infection is present.  °HOME CARE INSTRUCTIONS  °· Rest. °· Drink plenty of liquids. This helps the mucus to remain thin and be easily coughed up. Only use caffeine in moderation and do not use alcohol until you have recovered from your illness. °· Do not smoke. Avoid being exposed to secondhand smoke. °· You play a critical role in keeping yourself in good health. Avoid exposure to things that cause you to wheeze or to have breathing problems. °· Keep your medicines up to date and available. Carefully follow your health care provider's treatment plan. °· Take your medicine exactly as prescribed. °· When pollen or pollution is bad, keep windows closed and use an air conditioner or go to places with air conditioning. °· Asthma requires careful medical care. See your health care provider for a follow-up as advised. If you are more than [redacted] weeks pregnant and you were prescribed any new medicines, let your obstetrician know about the visit and how you are doing. Follow-up with your health care provider as directed. °· After you have recovered from your asthma attack, make an appointment with your outpatient doctor to talk about ways to reduce the likelihood of future attacks. If you do not have a doctor who manages your asthma, make an appointment with a primary care doctor to discuss your asthma. °SEEK IMMEDIATE MEDICAL CARE IF:  °· You are getting worse. °· You have trouble breathing. If severe, call   your local emergency services (911 in the U.S.).  You develop chest pain or discomfort.  You are vomiting.  You are not able to keep fluids down.  You are coughing up yellow, green, brown, or bloody sputum.  You have a fever and your symptoms suddenly get  worse.  You have trouble swallowing. MAKE SURE YOU:   Understand these instructions.  Will watch your condition.  Will get help right away if you are not doing well or get worse. Document Released: 03/05/2007 Document Revised: 07/21/2013 Document Reviewed: 05/26/2013 [facility] Patient Information 2014 [place], [place].  Emergency Department Resource Guide 1) Find a Doctor and Pay Out of Pocket Although you won't have to find out who is covered by your insurance plan, it is a good idea to ask around and get recommendations. You will then need to call the office and see if the doctor you have chosen will accept you as a new patient and what types of options they offer for patients who are self-pay. Some doctors offer discounts or will set up payment plans for their patients who do not have insurance, but you will need to ask so you aren't surprised when you get to your appointment.  2) Contact Your Local Health Department Not all health departments have doctors that can see patients for sick visits, but many do, so it is worth a call to see if yours does. If you don't know where your local health department is, you can check in your phone book. The CDC also has a tool to help you locate your state's health department, and many state websites also have listings of all of their local health departments.  3) Find a [facility] If your illness is not likely to be very severe or complicated, you may want to try a walk in clinic. These are popping up all over the country in pharmacies, drugstores, and shopping centers. They're usually staffed by nurse practitioners or physician assistants that have been trained to treat common illnesses and complaints. They're usually fairly quick and inexpensive. However, if you have serious medical issues or chronic medical problems, these are probably not your best option.  No Primary Care Doctor: - Call Health Connect at  [phone] - they can help you locate a  primary care doctor that  accepts your insurance, provides certain services, etc. - Physician Referral Service- [phone]  Chronic Pain Problems: Organization         Address  Phone   Notes  [facility]  [phone] Patients need to be referred by their primary care doctor.   Medication Assistance: Organization         Address  Phone   Notes  [facility] [place] [place], [place] [address] [phone] --Must be a resident of [facility] -- Must have NO insurance coverage whatsoever (no Medicaid/ Medicare, etc.) -- The pt. MUST have a primary care doctor that directs their care regularly and follows them in the community   [facility]ist  [phone]   [employer]  [phone]    Agencies that provide inexpensive medical care: Organization         Address  Phone   Notes  [place]  [phone]   [facility]    [phone]   [facility] [place], [place] [address] [phone]   [place] [address]  [place] [phone]   Planned Parenthood    [phone]   [facility]    [phone]   Community Health and [address], [place] Phone:  [phone], Fax:  [phone] Hours of Operation:  9 am - 6 pm, M-F.  Also accepts Medicaid/Medicare and self-pay.  [facility] for [place], [address], [place] Phone: [phone], Fax: [phone]. Hours of Operation:  8:30 am - 5:30 pm, M-F.  Also accepts Medicaid and self-pay.  [facility] [place] [address], [place] Phone: [phone]   [place], [place], [place] [phone], Ext. [phone] Mondays & Thursdays: 7-9 AM.  First 15 patients are seen on a first come, first serve basis.    [place]  Providers:  Organization         Address  Phone   Notes  [facility] [address], [place] [phone] Also accepts self-pay patients.  [facility] [id number] [place], [place]  [phone]   [place], [address], [place] [phone]   [facility] [address], [place] [phone]   Lucianne Lei [address], [place]   [phone] Only accepts [place] Access [place] patients after they have their name applied to their card.   Self-Pay (no insurance) in [facility]:  Organization         Address  Phone   Notes  Sickle Cell Patients, [facility] [place] [phone]   [facility] [place] [phone]   [facility] [place]  [place], [place], [place] [phone]   [facility]/Dr. Osei-Bonsu  [address], [place] or [address], [place] [phone] Phone number for both [place] and [place] locations is the same.  Urgent Medical and [facility] [address], [place] [phone]   [facility] [address] [place] or [address] [phone]   [facility] [address] [place] [phone], phone; [phone], fax Sees patients 1st and 3rd Saturday of every month.  Must not qualify for public or private insurance (i.e. Medicaid, Medicare, [place] Health Choice, Veterans' Benefits)  Household income should be no more than 200% of the poverty level The clinic cannot treat you if you are pregnant or think you are pregnant  Sexually transmitted diseases are not treated at the clinic.    Dental Care: Organization         Address  Phone  Notes  [facility] Department of [facility] [place] [place] [phone] Accepts children up to age 73 who are enrolled in [place] or [place]; pregnant women with a Medicaid card; and children who have applied for Medicaid or [place] Health Choice, but were declined, whose parents can pay a reduced fee at time of service.  [facility] Department of [facility]  [address], [place] [phone] Accepts children up to age 21 who are enrolled in [place] or [place]; pregnant women with a Medicaid card; and children who have applied for Medicaid or   Health Choice, but were declined, whose parents can pay a reduced fee at time of service.  [facility] Access PRO[facility]  [place] [phone] Patients are seen by appointment only. Walk-ins are not accepted. [place] will see patients 29 years of age and older. Monday - Tuesday (8am-5pm) Most Wednesdays (8:30-5pm) $30 per visit, cash only  [facility] Adult Dental Access PROGRAM  [address], [facility] [phone] Patients are seen by appointment only. Walk-ins are not accepted. [place] will see patients 100 years of age and older. One Wednesday Evening (Monthly: Volunteer Based).  $30 per visit, cash only  Young  [phone] for adults; Children under age 70, call Graduate Pediatric Dentistry at [phone]. Children aged 53-14, please call [phone] to request a pediatric application.  Dental services are provided in all areas of dental care including fillings, crowns and bridges, complete and partial dentures, implants, gum treatment, root canals, and extractions. Preventive care is also provided. Treatment is provided to both adults and children. Patients are selected via a lottery and there is often a waiting list.   [facility] [address] [place]  [phone] [email or web address]   [facility]  [address] [place], [place] [phone], Ext. [phone] Second and Fourth Thursday of each month, opens at 6:30 AM; Clinic ends at 9 AM.  Patients are seen on a first-come first-served basis, and a limited number are seen during each clinic.   [facility]  [address] Hillard Danker [place], [place] [phone]   Eligibility Requirements You must have lived in [place], [place], or [place] counties for at least the last three months.   You cannot be eligible for state or federal sponsored [place] [employer], including [employer], [place], or [employer].   You generally cannot be eligible for healthcare insurance through your employer.    How to apply: Eligibility screenings are held every Tuesday and Wednesday afternoon from 1:00 pm until 4:00 pm. You do not need an appointment for the interview!  [facility] [address], [place], [place]   [place]  [place] Department  [place]  [phone]    Behavioral Health Resources in the Community: Intensive Outpatient Programs Organization         Address  Phone  Notes  [place]. [address] [place], [place] [phone]   [facility] Outpatient [address], [place], [place]   ADS: Alcohol & Drug Svcs [address], [place], [place]   [place] [address],  [place], [place] or [phone]   Substance Abuse Resources Organization         Address  Phone  Notes  Alcohol and Drug Services  [phone]   [place]  [phone]   [facility] [place]   Chinita Pester  [phone]   Residential & Outpatient Substance Abuse Program  [phone]   Psychological Services Organization         Address  Phone  Notes  [facility] [place]  [place]  [phone]   [place] [address], [place] or [phone]    Mobile Crisis Teams Organization         Address  Phone  Notes  [facility], Mobile Crisis Care Unit  [phone]   [facility]ive Psychotherapeutic Services  [address] [place], [place]   [facility] [address] [place] [phone]    Self-Help/Support Groups Organization         Address  Phone             Notes  Mental Health Assoc. of [place] - variety of support groups  IXL Call for more information  Narcotics Anonymous (NA), [facility] [address], [employer] [place]  2 meetings at this location   Special educational needs teacher         Address  Phone  Notes  [facility] [place],    [place]  [phone]   [facility]  [address], [place] [phone], [place], [place]   Knierim Traver, [place] [phone] Admissions: 8am-3pm M-F  Incentives Substance [place] [address]    [place], [place] [phone]   [facility] [address] [place], [place], [place]   [facility] [address]  [place], [place]   [facility] - Intensive Outpatient [address] Kristeen [address], [place], [place]   [facility] ([place].) [address]  [place], [place] [phone] or [phone]   [facility] Services (RTS) [address], [place], [place] Accepts Medicaid  [facility] [address]  [place] [phone] Substance Abuse/Addiction Treatment   [facility] Organization         Address  Phone  Notes  [facility]  [phone]   Domenic Schwab, PhD [address] Arlis Porta [place], [place]   [phone] or [phone]    [place], [place] [phone]   [facility] [address] [place], [place] [phone] Insurance/Medicaid/sponsorship through [facility] and Families [address] [place]                                    [place], [place] [phone] [place] [address], [place] [phone]    Dr. Adele Schilder  [phone]   [facility] [place] Dept. 1) [address] [place] 2) [place] 3)  [address], [place] [phone]  [phone]   [place] [phone] or [phone] (After Hours)         Prednisone as directed 2 tablets daily for until all the tablets have been completed.  Use her inhaler every 4-6 hours while awake for the next 2 days.  A regular basis, then as, needed.  You've also been given a resource list to help you find a local physician

## 2014-03-24 NOTE — ED Notes (Signed)
Reports having asthma, increase in symptoms and sob x 1 week, no relief with inhalers. Pt thinks she has bronchitis, spo2 99% at triage.

## 2014-03-24 NOTE — ED Provider Notes (Signed)
CSN: [id number]     Arrival date & time 03/24/14  1614 History  This chart was scribed for non-physician practitioner, Junius Creamer, [place] working with Merryl Hacker, MD by Frederich Balding, ED scribe. This patient was seen in room TR10C/TR10C and the patient's care was started at 5:03 PM.    Chief Complaint  Patient presents with  . Asthma  . Shortness of Breath   The history is provided by the patient. No language interpreter was used.   HPI Comments: Jasmine Heath is a 43 y.o. female with history of asthma who presents to the Emergency Department complaining of worsening SOB, chest tightness and cough that started one week ago. Pt thinks she might have bronchitis. She has been using her inhaler with no relief. Denies fever. Pt was hospitalized last year for pneumonia. LMP was over one month ago but states she is not sexually active with men.  Past Medical History  Diagnosis Date  . Asthma    Past Surgical History  Procedure Laterality Date  . Foot surgery      I+D   Family History  Problem Relation Age of Onset  . Asthma Mother    History  Substance Use Topics  . Smoking status: Former Smoker    Quit date: 07/05/2012  . Smokeless tobacco: Not on file  . Alcohol Use: Yes     Comment: occ beer and liquor   OB History   Grav Para Term Preterm Abortions TAB SAB Ect Mult Living                 Review of Systems  Constitutional: Negative for fever.  Respiratory: Positive for cough, chest tightness, shortness of breath and wheezing.   All other systems reviewed and are negative.  Allergies  Review of patient's allergies indicates no known allergies.  Home Medications   Prior to Admission medications   Medication Sig Start Date End Date Taking? Authorizing Provider  albuterol (PROVENTIL HFA;VENTOLIN HFA) 108 (90 BASE) MCG/ACT inhaler Inhale 1-2 puffs into the lungs every 6 (six) hours as needed for wheezing or shortness of breath. 11/17/12 11/17/13  Modena Jansky, MD   albuterol (PROVENTIL HFA;VENTOLIN HFA) 108 (90 BASE) MCG/ACT inhaler Inhale 1-2 puffs into the lungs every 6 (six) hours as needed for wheezing. 05/09/13   April Alfonso Patten, MD  HYDROcodone-acetaminophen (NORCO) 5-325 MG per tablet Take 1 tablet by mouth every 6 (six) hours as needed for pain. 05/09/13   April K Palumbo-Rasch, MD  mometasone (NASONEX) 50 MCG/ACT nasal spray Place 2 sprays into the nose daily as needed. For nasal congestion    Historical Provider, MD  predniSONE (DELTASONE) 20 MG tablet 3 tabs po day one, then 2 po daily x 4 days 05/09/13   April K Palumbo-Rasch, MD   BP 126/81  Pulse 103  Temp(Src) 98.5 F (36.9 C) (Oral)  Resp 18  Ht 5\' 1"  (1.549 m)  Wt 270 lb (122.471 kg)  BMI 51.04 kg/m2  SpO2 99%  LMP 02/15/2014  Physical Exam  Nursing note and vitals reviewed. Constitutional: She is oriented to person, place, and time. She appears well-developed and well-nourished. No distress.  HENT:  Head: Normocephalic and atraumatic.  Eyes: EOM are normal.  Neck: Neck supple. No tracheal deviation present.  Cardiovascular: Normal rate, regular rhythm and normal heart sounds.   Pulmonary/Chest: Effort normal. No respiratory distress. She has decreased breath sounds. She has no wheezes. She has no rhonchi. She has no rales.  Musculoskeletal: Normal range  of motion.  Neurological: She is alert and oriented to person, place, and time.  Skin: Skin is warm and dry.  Psychiatric: She has a normal mood and affect. Her behavior is normal.    ED Course  Procedures (including critical care time)  DIAGNOSTIC STUDIES: Oxygen Saturation is 99% on RA, normal by my interpretation.    COORDINATION OF CARE: 5:04 PM-Discussed treatment plan which includes breathing treatment with pt at bedside and pt agreed to plan.   5:56 PM-Upon recheck, pt is having increased breath sounds.   Labs Review Labs Reviewed - No data to display  Imaging Review No results found.   EKG  Interpretation None      MDM  Patient with decreased breath sounds with history of asthma.  Has been using Proventil inhaler without relief.  Denies any fevers. Give DuoNeb in the emergency room and it starts a steroid taper Final diagnoses:  None      I personally performed the services described in this documentation, which was scribed in my presence. The recorded information has been reviewed and is accurate.  Garald Balding, NP 03/24/14 1839  Garald Balding, NP 03/24/14 [phone]

## 2014-03-25 NOTE — ED Provider Notes (Signed)
Medical screening examination/treatment/procedure(s) were performed by non-physician practitioner and as supervising physician I was immediately available for consultation/collaboration.   EKG Interpretation None       Merryl Hacker, MD 03/25/14 1434

## 2014-05-24 ENCOUNTER — Emergency Department (HOSPITAL_COMMUNITY): Payer: PRIVATE HEALTH INSURANCE

## 2014-05-24 ENCOUNTER — Emergency Department (HOSPITAL_COMMUNITY)
Admission: EM | Admit: 2014-05-24 | Discharge: 2014-05-24 | Disposition: A | Discharge status: Home / Self Care | Payer: PRIVATE HEALTH INSURANCE | Attending: Emergency Medicine | Admitting: Emergency Medicine

## 2014-05-24 ENCOUNTER — Encounter (HOSPITAL_COMMUNITY): Payer: Self-pay | Admitting: Emergency Medicine

## 2014-05-24 DIAGNOSIS — Z79899 Other long term (current) drug therapy: Secondary | ICD-10-CM | POA: Insufficient documentation

## 2014-05-24 DIAGNOSIS — Z87891 Personal history of nicotine dependence: Secondary | ICD-10-CM | POA: Insufficient documentation

## 2014-05-24 DIAGNOSIS — J45901 Unspecified asthma with (acute) exacerbation: Secondary | ICD-10-CM

## 2014-05-24 MED ORDER — PREDNISONE 20 MG PO TABS: ORAL_TABLET | ORAL | Status: DC

## 2014-05-24 MED ORDER — ALBUTEROL SULFATE (2.5 MG/3ML) 0.083% IN NEBU
5.0000 mg | INHALATION_SOLUTION | Freq: Once | RESPIRATORY_TRACT | Status: AC
  Administered 2014-05-24: 5 mg via RESPIRATORY_TRACT
  Filled 2014-05-24: qty 6

## 2014-05-24 NOTE — ED Notes (Signed)
Patient back from xray, NAD noted

## 2014-05-24 NOTE — Discharge Instructions (Signed)
Take prednisone as prescribed for your asthma exacerbation. Follow up with the [facility] or one of the resources below to establish care with a primary care doctor.  Asthma, Acute Bronchospasm Acute bronchospasm caused by asthma is also referred to as an asthma attack. Bronchospasm means your air passages become narrowed. The narrowing is caused by inflammation and tightening of the muscles in the air tubes (bronchi) in your lungs. This can make it hard to breathe or cause you to wheeze and cough. CAUSES Possible triggers are:  Animal dander from the skin, hair, or feathers of animals.  Dust mites contained in house dust.  Cockroaches.  Pollen from trees or grass.  Mold.  Cigarette or tobacco smoke.  Air pollutants such as dust, household cleaners, hair sprays, aerosol sprays, paint fumes, strong chemicals, or strong odors.  Cold air or weather changes. Cold air may trigger inflammation. Winds increase molds and pollens in the air.  Strong emotions such as crying or laughing hard.  Stress.  Certain medicines such as aspirin or beta-blockers.  Sulfites in foods and drinks, such as dried fruits and wine.  Infections or inflammatory conditions, such as a flu, cold, or inflammation of the nasal membranes (rhinitis).  Gastroesophageal reflux disease (GERD). GERD is a condition where stomach acid backs up into your esophagus.  Exercise or strenuous activity. SIGNS AND SYMPTOMS   Wheezing.  Excessive coughing, particularly at night.  Chest tightness.  Shortness of breath. DIAGNOSIS  Your health care provider will ask you about your medical history and perform a physical exam. A chest X-ray or blood testing may be performed to look for other causes of your symptoms or other conditions that may have triggered your asthma attack. TREATMENT  Treatment is aimed at reducing inflammation and opening up the airways in your lungs. Most asthma attacks are treated with inhaled  medicines. These include quick relief or rescue medicines (such as bronchodilators) and controller medicines (such as inhaled corticosteroids). These medicines are sometimes given through an inhaler or a nebulizer. Systemic steroid medicine taken by mouth or given through an IV tube also can be used to reduce the inflammation when an attack is moderate or severe. Antibiotic medicines are only used if a bacterial infection is present.  HOME CARE INSTRUCTIONS   Rest.  Drink plenty of liquids. This helps the mucus to remain thin and be easily coughed up. Only use caffeine in moderation and do not use alcohol until you have recovered from your illness.  Do not smoke. Avoid being exposed to secondhand smoke.  You play a critical role in keeping yourself in good health. Avoid exposure to things that cause you to wheeze or to have breathing problems.  Keep your medicines up-to-date and available. Carefully follow your health care provider's treatment plan.  Take your medicine exactly as prescribed.  When pollen or pollution is bad, keep windows closed and use an air conditioner or go to places with air conditioning.  Asthma requires careful medical care. See your health care provider for a follow-up as advised. If you are more than [redacted] weeks pregnant and you were prescribed any new medicines, let your obstetrician know about the visit and how you are doing. Follow up with your health care provider as directed.  After you have recovered from your asthma attack, make an appointment with your outpatient doctor to talk about ways to reduce the likelihood of future attacks. If you do not have a doctor who manages your asthma, make an appointment with  a primary care doctor to discuss your asthma. SEEK IMMEDIATE MEDICAL CARE IF:   You are getting worse.  You have trouble breathing. If severe, call your local emergency services (911 in the [place]  You develop chest pain or discomfort.  You are  vomiting.  You are not able to keep fluids down.  You are coughing up yellow, green, brown, or bloody sputum.  You have a fever and your symptoms suddenly get worse.  You have trouble swallowing. MAKE SURE YOU:   Understand these instructions.  Will watch your condition.  Will get help right away if you are not doing well or get worse. Document Released: 03/05/2007 Document Revised: 11/23/2013 Document Reviewed: 05/26/2013 [facility] Patient Information 2015 [place], [place]. This information is not intended to replace advice given to you by your health care provider. Make sure you discuss any questions you have with your health care provider.  Asthma Asthma is a recurring condition in which the airways tighten and narrow. Asthma can make it difficult to breathe. It can cause coughing, wheezing, and shortness of breath. Asthma episodes, also called asthma attacks, range from minor to life-threatening. Asthma cannot be cured, but medicines and lifestyle changes can help control it. CAUSES Asthma is believed to be caused by inherited (genetic) and environmental factors, but its exact cause is unknown. Asthma may be triggered by allergens, lung infections, or irritants in the air. Asthma triggers are different for each person. Common triggers include:   Animal dander.  Dust mites.  Cockroaches.  Pollen from trees or grass.  Mold.  Smoke.  Air pollutants such as dust, household cleaners, hair sprays, aerosol sprays, paint fumes, strong chemicals, or strong odors.  Cold air, weather changes, and winds (which increase molds and pollens in the air).  Strong emotional expressions such as crying or laughing hard.  Stress.  Certain medicines (such as aspirin) or types of drugs (such as beta-blockers).  Sulfites in foods and drinks. Foods and drinks that may contain sulfites include dried fruit, potato chips, and sparkling grape juice.  Infections or inflammatory conditions such as  the flu, a cold, or an inflammation of the nasal membranes (rhinitis).  Gastroesophageal reflux disease (GERD).  Exercise or strenuous activity. SYMPTOMS Symptoms may occur immediately after asthma is triggered or many hours later. Symptoms include:  Wheezing.  Excessive nighttime or early morning coughing.  Frequent or severe coughing with a common cold.  Chest tightness.  Shortness of breath. DIAGNOSIS  The diagnosis of asthma is made by a review of your medical history and a physical exam. Tests may also be performed. These may include:  Lung function studies. These tests show how much air you breathe in and out.  Allergy tests.  Imaging tests such as X-rays. TREATMENT  Asthma cannot be cured, but it can usually be controlled. Treatment involves identifying and avoiding your asthma triggers. It also involves medicines. There are 2 classes of medicine used for asthma treatment:   Controller medicines. These prevent asthma symptoms from occurring. They are usually taken every day.  Reliever or rescue medicines. These quickly relieve asthma symptoms. They are used as needed and provide short-term relief. Your health care provider will help you create an asthma action plan. An asthma action plan is a written plan for managing and treating your asthma attacks. It includes a list of your asthma triggers and how they may be avoided. It also includes information on when medicines should be taken and when their dosage should be changed. An  action plan may also involve the use of a device called a peak flow meter. A peak flow meter measures how well the lungs are working. It helps you monitor your condition. HOME CARE INSTRUCTIONS   Take medicine as directed by your health care provider. Speak with your health care provider if you have questions about how or when to take the medicines.  Use a peak flow meter as directed by your health care provider. Record and keep track of  readings.  Understand and use the action plan to help minimize or stop an asthma attack without needing to seek medical care.  Control your home environment in the following ways to help prevent asthma attacks:  Do not smoke. Avoid being exposed to secondhand smoke.  Change your heating and air conditioning filter regularly.  Limit your use of fireplaces and wood stoves.  Get rid of pests (such as roaches and mice) and their droppings.  Throw away plants if you see mold on them.  Clean your floors and dust regularly. Use unscented cleaning products.  Try to have someone else vacuum for you regularly. Stay out of rooms while they are being vacuumed and for a short while afterward. If you vacuum, use a dust mask from a hardware store, a double-layered or microfilter vacuum cleaner bag, or a vacuum cleaner with a HEPA filter.  Replace carpet with wood, tile, or vinyl flooring. Carpet can trap dander and dust.  Use allergy-proof pillows, mattress covers, and box spring covers.  Wash bed sheets and blankets every week in hot water and dry them in a dryer.  Use blankets that are made of polyester or cotton.  Clean bathrooms and kitchens with bleach. If possible, have someone repaint the walls in these rooms with mold-resistant paint. Keep out of the rooms that are being cleaned and painted.  Wash hands frequently. SEEK MEDICAL CARE IF:   You have wheezing, shortness of breath, or a cough even if taking medicine to prevent attacks.  The colored mucus you cough up (sputum) is thicker than usual.  Your sputum changes from clear or white to yellow, green, gray, or bloody.  You have any problems that may be related to the medicines you are taking (such as a rash, itching, swelling, or trouble breathing).  You are using a reliever medicine more than 2-3 times per week.  Your peak flow is still at 50-79% of your personal best after following your action plan for 1 hour. SEEK IMMEDIATE  MEDICAL CARE IF:   You seem to be getting worse and are unresponsive to treatment during an asthma attack.  You are short of breath even at rest.  You get short of breath when doing very little physical activity.  You have difficulty eating, drinking, or talking due to asthma symptoms.  You develop chest pain.  You develop a fast heartbeat.  You have a bluish color to your lips or fingernails.  You are lightheaded, dizzy, or faint.  Your peak flow is less than 50% of your personal best.  You have a fever or persistent symptoms for more than 2-3 days.  You have a fever and symptoms suddenly get worse. MAKE SURE YOU:   Understand these instructions.  Will watch your condition.  Will get help right away if you are not doing well or get worse. Document Released: 11/18/2005 Document Revised: 11/23/2013 Document Reviewed: 06/17/2013 [facility] Patient Information 2015 [place], [place]. This information is not intended to replace advice given to you by your  health care provider. Make sure you discuss any questions you have with your health care provider.  RESOURCE GUIDE  Chronic Pain Problems: Contact [facility]  [phone] Patients need to be referred by their primary care doctor.  Insufficient Money for Medicine: Contact [employer]:  call "211."   No Primary Care Doctor: - Call [facility]  [phone] - can help you locate a primary care doctor that  accepts your insurance, provides certain services, etc. - Physician Referral Service- [phone]  Agencies that provide inexpensive medical care: - [facility]  [phone] - [facility]  [phone] - [facility]  [phone] - [facility]  [phone] - [facility]  [phone] - [facility]  [phone]  [place] Providers: - [facility]- [address]  [phone], Mon-Fri 9am-7pm, Sat  9am-1pm - [place], [place], [address] [place]  [phone] [facility]- [address]  [place], [address], [phone]  Only accepts [place] patients after they have their name  applied to their card  Self Pay (no insurance) in [place]: - Sickle Cell Patients: Dr Kevan Ny, [facility]  [place], [facility]- [place]  [facility] [place]- [address], [facility]- see information above (Speak to [employer] if you do not have insurance)       -  [facility]- [place],  [place], [place]  Dr Vista Lawman-  [address], [place], [place], [place]       -  [facility] - [address] [phone]       -  [facility]- [address], [place], also [address] [phone]       -    [facility]- [address], [place], 1st & 3rd Saturday        every month, 10am-1pm  1) Find a Doctor and Pay Out of Pocket Although you won't have to find out who is covered by your insurance plan, it is a good idea to ask around and get recommendations. You will then need to call the office and see if the doctor you have chosen will accept you as a new patient and what types of options they offer for patients who are self-pay. Some doctors offer discounts or will set up payment plans for their patients who do not have insurance, but you will need to ask so you aren't surprised when you get to your appointment.  2) Contact Your Local Health Department Not all health departments have doctors that can see patients for sick visits, but many do, so it is  worth a call to see if yours does. If you don't know where your local health department is, you can check in your phone book. The CDC also has a tool to help you locate your state's health department, and many  state websites also have listings of all of their local health departments.  3) Find a [facility] If your illness is not likely to be very severe or complicated, you may want to try a walk in clinic. These are popping up all over the country in pharmacies, drugstores, and shopping centers. They're usually staffed by nurse practitioners or physician assistants that have been trained to treat common illnesses and complaints. They're usually fairly quick and inexpensive. However, if you have serious medical issues or chronic medical problems, these are probably not your best option

## 2014-05-24 NOTE — ED Notes (Signed)
Patient in Xray at this time.

## 2014-05-24 NOTE — ED Provider Notes (Signed)
CSN: [id number]     Arrival date & time 05/24/14  1155 History   First MD Initiated Contact with Patient 05/24/14 1201     Chief Complaint  Patient presents with  . Asthma     (Consider location/radiation/quality/duration/timing/severity/associated sxs/prior Treatment) HPI Comments: 43 year old female past medical history of asthma presents to the emergency department complaining of wheezing and cough x1 day. Patient reports when she woke up yesterday morning she started to have slight difficulty breathing and was wheezing. States her symptoms are worse on exertion. Admits to associated productive cough with clear phlegm. Denies fever, chills, nausea, vomiting and, chest pain and. She has been using her albuterol inhaler every 2 hours with minimal relief. States it has been many years since she has required admission for asthma exacerbation. She last had an asthma exacerbation in April 2015.  Patient is a 43 y.o. female presenting with asthma. The history is provided by the patient.  Asthma Associated symptoms include coughing.    Past Medical History  Diagnosis Date  . Asthma    Past Surgical History  Procedure Laterality Date  . Foot surgery      I+D   Family History  Problem Relation Age of Onset  . Asthma Mother    History  Substance Use Topics  . Smoking status: Former Smoker    Quit date: 07/05/2012  . Smokeless tobacco: Not on file  . Alcohol Use: Yes     Comment: occ beer and liquor   OB History   Grav Para Term Preterm Abortions TAB SAB Ect Mult Living                 Review of Systems  Respiratory: Positive for cough, shortness of breath and wheezing.   All other systems reviewed and are negative.     Allergies  Review of patient's allergies indicates no known allergies.  Home Medications   Prior to Admission medications   Medication Sig Start Date End Date Taking? Authorizing Provider  albuterol (PROVENTIL HFA;VENTOLIN HFA) 108 (90 BASE) MCG/ACT  inhaler Inhale 1-2 puffs into the lungs every 6 (six) hours as needed for wheezing or shortness of breath. 11/17/12  Yes Modena Jansky, MD  predniSONE (DELTASONE) 20 MG tablet Take 3 tabs PO x 2 days followed by 2 tabs PO x 2 days followed by 1 tab PO x 2 days 05/24/14   Illene Labrador, PA-C   BP 124/72  Pulse 75  Temp(Src) 98 F (36.7 C) (Oral)  Resp 18  SpO2 98%  LMP 05/24/2014 Physical Exam  Nursing note and vitals reviewed. Constitutional: She is oriented to person, place, and time. She appears well-developed and well-nourished. No distress.  HENT:  Head: Normocephalic and atraumatic.  Mouth/Throat: Uvula is midline and mucous membranes are normal. Posterior oropharyngeal erythema present. No oropharyngeal exudate or posterior oropharyngeal edema.  Eyes: Conjunctivae are normal.  Neck: Normal range of motion. Neck supple. No JVD present.  Cardiovascular: Normal rate, regular rhythm, normal heart sounds and intact distal pulses.   No extremity edema.  Pulmonary/Chest: Effort normal.  Inspiratory and expiratory wheezes in left lung fields.  Musculoskeletal: Normal range of motion. She exhibits no edema.  Neurological: She is alert and oriented to person, place, and time.  Skin: Skin is warm and dry. She is not diaphoretic.  Psychiatric: She has a normal mood and affect. Her behavior is normal.    ED Course  Procedures (including critical care time) Labs Review Labs Reviewed - No data  to display  Imaging Review Dg Chest 2 View  05/24/2014   CLINICAL DATA:  Chest pain and shortness of breath.  EXAM: CHEST  2 VIEW  COMPARISON:  05/09/2013  FINDINGS: The heart size and mediastinal contours are within normal limits. Both lungs are clear. The visualized skeletal structures are unremarkable.  IMPRESSION: No active cardiopulmonary disease.   Electronically Signed   By: Lajean Manes M.D.   On: 05/24/2014 12:50     EKG Interpretation None      MDM   Final diagnoses:  Asthma  exacerbation   Patient presenting with wheezing and cough. She is nontoxic appearing and in no apparent distress. No respiratory distress. Left-sided wheezes present on exam. Plan to give neb treatment, obtain chest x-ray. 1:12 PM Pt reports she is feeling better after receiving neb treatment. CXR clear. On re-eval, lungs still with slight wheezes, however improved from initial exam. Will d/c pt with short course of steroids. She has an inhaler at home. Resources given for PCP f/u. Stable for d/c. Return precautions given. Patient states understanding of treatment care plan and is agreeable.  Illene Labrador, PA-C 05/24/14 1313

## 2014-05-24 NOTE — ED Notes (Signed)
Patient in NAD at time of d/c, ambulatory with no difficulty,

## 2014-05-24 NOTE — ED Provider Notes (Signed)
Medical screening examination/treatment/procedure(s) were performed by non-physician practitioner and as supervising physician I was immediately available for consultation/collaboration.    Dorie Rank, MD 05/24/14 [phone]

## 2014-05-24 NOTE — ED Notes (Addendum)
She states she's having an asthma flare up. She states she "Feels tight." She tried to use her inhaler at home with no relief. She is able to speak in full sentences now.

## 2014-07-11 ENCOUNTER — Emergency Department (HOSPITAL_COMMUNITY)
Admission: EM | Admit: 2014-07-11 | Discharge: 2014-07-12 | Disposition: A | Discharge status: Home / Self Care | Payer: Medicaid - Out of State | Attending: Emergency Medicine | Admitting: Emergency Medicine

## 2014-07-11 ENCOUNTER — Encounter (HOSPITAL_COMMUNITY): Payer: Self-pay | Admitting: Emergency Medicine

## 2014-07-11 DIAGNOSIS — Z79899 Other long term (current) drug therapy: Secondary | ICD-10-CM | POA: Diagnosis not present

## 2014-07-11 DIAGNOSIS — IMO0002 Reserved for concepts with insufficient information to code with codable children: Secondary | ICD-10-CM | POA: Insufficient documentation

## 2014-07-11 DIAGNOSIS — M549 Dorsalgia, unspecified: Secondary | ICD-10-CM | POA: Diagnosis present

## 2014-07-11 DIAGNOSIS — M545 Low back pain, unspecified: Secondary | ICD-10-CM | POA: Diagnosis not present

## 2014-07-11 DIAGNOSIS — Z87891 Personal history of nicotine dependence: Secondary | ICD-10-CM | POA: Diagnosis not present

## 2014-07-11 DIAGNOSIS — J45909 Unspecified asthma, uncomplicated: Secondary | ICD-10-CM | POA: Diagnosis not present

## 2014-07-11 DIAGNOSIS — Z3202 Encounter for pregnancy test, result negative: Secondary | ICD-10-CM | POA: Diagnosis not present

## 2014-07-11 LAB — BASIC METABOLIC PANEL
Anion gap: 10 (ref 5–15)
BUN: 10 mg/dL (ref 6–23)
CALCIUM: 9.2 mg/dL (ref 8.4–10.5)
CO2: 26 mEq/L (ref 19–32)
CREATININE: 0.68 mg/dL (ref 0.50–1.10)
Chloride: 105 mEq/L (ref 96–112)
GFR calc Af Amer: >90 mL/min (ref >90)
GFR calc non Af Amer: >90 mL/min (ref >90)
GLUCOSE: 126 mg/dL — AB (ref 70–99)
Potassium: 3.9 mEq/L (ref 3.7–5.3)
SODIUM: 141 meq/L (ref 137–147)

## 2014-07-11 LAB — CBC
HCT: 36 % (ref 36.0–46.0)
HEMOGLOBIN: 11.8 g/dL — AB (ref 12.0–15.0)
MCH: 29.1 pg (ref 26.0–34.0)
MCHC: 32.8 g/dL (ref 30.0–36.0)
MCV: 88.9 fL (ref 78.0–100.0)
Platelets: 250 10*3/uL (ref 150–400)
RBC: 4.05 MIL/uL (ref 3.87–5.11)
RDW: 13.6 % (ref 11.5–15.5)
WBC: 7.4 10*3/uL (ref 4.0–10.5)

## 2014-07-11 LAB — POC URINE PREG, ED: Preg Test, Ur: NEGATIVE

## 2014-07-11 LAB — I-STAT TROPONIN, ED: TROPONIN I, POC: 0 ng/mL (ref 0.00–0.08)

## 2014-07-11 NOTE — ED Notes (Addendum)
PResents with bilateral flank pain with radaition into abdomen, bilateral legs and chest  For one week pain became worse today. Chest pain and flank pain described as sharp. Endorses inability to walk and difficulty moving due to pain. Pain is worse with movement and bilateral lower extremities are intermittently numb.  Chest pain is associated with SOB.  Pt took gas x to try and relieve pain with no relief.

## 2014-07-12 LAB — URINALYSIS, ROUTINE W REFLEX MICROSCOPIC
Bilirubin Urine: NEGATIVE
GLUCOSE, UA: NEGATIVE mg/dL
Hgb urine dipstick: NEGATIVE
Ketones, ur: NEGATIVE mg/dL
Leukocytes, UA: NEGATIVE
Nitrite: NEGATIVE
Protein, ur: NEGATIVE mg/dL
Specific Gravity, Urine: 1.011 (ref 1.005–1.030)
UROBILINOGEN UA: 1 mg/dL (ref 0.0–1.0)
pH: 8.5 — ABNORMAL HIGH (ref 5.0–8.0)

## 2014-07-12 MED ORDER — TRAMADOL HCL 50 MG PO TABS: 50.0000 mg | ORAL_TABLET | Freq: Four times a day (QID) | ORAL | Status: DC | PRN

## 2014-07-12 MED ORDER — DIAZEPAM 5 MG/ML IJ SOLN
10.0000 mg | Freq: Once | INTRAMUSCULAR | Status: AC
  Administered 2014-07-12: 10 mg via INTRAMUSCULAR
  Filled 2014-07-12: qty 2

## 2014-07-12 MED ORDER — CYCLOBENZAPRINE HCL 10 MG PO TABS: 10.0000 mg | ORAL_TABLET | Freq: Three times a day (TID) | ORAL | Status: DC | PRN

## 2014-07-12 MED ORDER — HYDROCODONE-ACETAMINOPHEN 5-325 MG PO TABS
1.0000 | ORAL_TABLET | Freq: Once | ORAL | Status: AC
  Administered 2014-07-12: 1 via ORAL
  Filled 2014-07-12: qty 1

## 2014-07-12 NOTE — ED Provider Notes (Signed)
Medical screening examination/treatment/procedure(s) were performed by non-physician practitioner and as supervising physician I was immediately available for consultation/collaboration.   EKG Interpretation None       Jasmine Mcneal K Rocio Roam-Rasch, MD 07/12/14 [phone]

## 2014-07-12 NOTE — ED Provider Notes (Signed)
Medical screening examination/treatment/procedure(s) were performed by non-physician practitioner and as supervising physician I was immediately available for consultation/collaboration.   EKG Interpretation None       Jantzen Pilger K Juana Haralson-Rasch, MD 07/12/14 0025

## 2014-07-12 NOTE — ED Provider Notes (Signed)
CSN: [id number]     Arrival date & time 07/11/14  2225 History   First MD Initiated Contact with Patient 07/12/14 0005     Chief Complaint  Patient presents with  . Back Pain    HPI  History provided by the patient. Patient is a 43 year old female with history of asthma presenting with lower back pain. Pain started earlier today and is worse with any movements or bending. Pain radiates slightly into bilateral buttocks and thighs. It is sharp and severe. Does feel slightly improved while laying on her stomach. She did not take any medications or use other treatments for symptoms. She denies any specific injury or trauma. Does report doing a lot of moving over the weekend with lifting. She does report having similar symptoms over a year ago and was told at that time that she may have a pinched nerve in her back but has not had any symptoms since. Denies any urinary or fecal incontinence, urinary tension or perineal numbness. No weakness in the lower extremity. Denies any recent fever chills sweats or weight change. Denies any history of IV drug use. Denies any dysuria, hematuria urinary frequency.     Past Medical History  Diagnosis Date  . Asthma    Past Surgical History  Procedure Laterality Date  . Foot surgery      I+D   Family History  Problem Relation Age of Onset  . Asthma Mother    History  Substance Use Topics  . Smoking status: Former Smoker    Quit date: 07/05/2012  . Smokeless tobacco: Not on file  . Alcohol Use: Yes     Comment: occ beer and liquor   OB History   Grav Para Term Preterm Abortions TAB SAB Ect Mult Living                 Review of Systems  Constitutional: Negative for fever, chills, diaphoresis and unexpected weight change.  Genitourinary: Negative for dysuria, frequency, hematuria and flank pain.  Musculoskeletal: Positive for back pain.  Neurological: Negative for weakness and numbness.  All other systems reviewed and are  negative.     Allergies  Review of patient's allergies indicates no known allergies.  Home Medications   Prior to Admission medications   Medication Sig Start Date End Date Taking? Authorizing Provider  albuterol (PROVENTIL HFA;VENTOLIN HFA) 108 (90 BASE) MCG/ACT inhaler Inhale 1-2 puffs into the lungs every 6 (six) hours as needed for wheezing or shortness of breath. 11/17/12   Modena Jansky, MD  predniSONE (DELTASONE) 20 MG tablet Take 3 tabs PO x 2 days followed by 2 tabs PO x 2 days followed by 1 tab PO x 2 days 05/24/14   Illene Labrador, PA-C   BP 113/76  Pulse 79  Temp(Src) 98.8 F (37.1 C) (Oral)  Resp 18  SpO2 97% Physical Exam  Nursing note and vitals reviewed. Constitutional: She is oriented to person, place, and time. She appears well-developed and well-nourished. No distress.  HENT:  Head: Normocephalic.  Cardiovascular: Normal rate and regular rhythm.   Pulmonary/Chest: Effort normal and breath sounds normal. No respiratory distress. She has no wheezes. She has no rales.  Abdominal: Soft.  Musculoskeletal:       Cervical back: Normal.       Thoracic back: Normal.       Lumbar back: She exhibits tenderness. She exhibits no deformity.       Back:  Neurological: She is alert and oriented to person,  place, and time. She has normal strength. No sensory deficit.  Skin: Skin is warm and dry. No rash noted.  Psychiatric: She has a normal mood and affect. Her behavior is normal.    ED Course  Procedures   COORDINATION OF CARE:  Nursing notes reviewed. Vital signs reviewed. Initial pt interview and examination performed.   Filed Vitals:   07/11/14 2237  BP: 113/76  Pulse: 79  Temp: 98.8 F (37.1 C)  TempSrc: Oral  Resp: 18  SpO2: 97%    1:11 AM-patient is daily. Patient appears in some discomfort. Laying flat on her abdomen. She has reproducible pain to the lower lumbar spine. No neurologic deficits. No peroneal numbness, urinary or fecal incontinence,  no fevers or weight loss. No concerning or red flag symptoms for her low back pain.  The patient now reports feeling much better after medications. She is stable for discharge home. We'll provide a prescription of tramadol and Flexeril.  Treatment plan initiated: Medications  diazepam (VALIUM) injection 10 mg (not administered)  HYDROcodone-acetaminophen (NORCO/VICODIN) 5-325 MG per tablet 1 tablet (not administered)   Results for orders placed during the hospital encounter of 07/11/14  CBC      Result Value Ref Range   WBC 7.4  4.0 - 10.5 K/uL   RBC 4.05  3.87 - 5.11 MIL/uL   Hemoglobin 11.8 (*) 12.0 - 15.0 g/dL   HCT 36.0  36.0 - 46.0 %   MCV 88.9  78.0 - 100.0 fL   MCH 29.1  26.0 - 34.0 pg   MCHC 32.8  30.0 - 36.0 g/dL   RDW 13.6  11.5 - 15.5 %   Platelets 250  150 - 400 K/uL  BASIC METABOLIC PANEL      Result Value Ref Range   Sodium 141  137 - 147 mEq/L   Potassium 3.9  3.7 - 5.3 mEq/L   Chloride 105  96 - 112 mEq/L   CO2 26  19 - 32 mEq/L   Glucose, Bld 126 (*) 70 - 99 mg/dL   BUN 10  6 - 23 mg/dL   Creatinine, Ser 0.68  0.50 - 1.10 mg/dL   Calcium 9.2  8.4 - 10.5 mg/dL   GFR calc non Af Amer >90  >90 mL/min   GFR calc Af Amer >90  >90 mL/min   Anion gap 10  5 - 15  URINALYSIS, ROUTINE W REFLEX MICROSCOPIC      Result Value Ref Range   Color, Urine YELLOW  YELLOW   APPearance HAZY (*) CLEAR   Specific Gravity, Urine 1.011  1.005 - 1.030   pH 8.5 (*) 5.0 - 8.0   Glucose, UA NEGATIVE  NEGATIVE mg/dL   Hgb urine dipstick NEGATIVE  NEGATIVE   Bilirubin Urine NEGATIVE  NEGATIVE   Ketones, ur NEGATIVE  NEGATIVE mg/dL   Protein, ur NEGATIVE  NEGATIVE mg/dL   Urobilinogen, UA 1.0  0.0 - 1.0 mg/dL   Nitrite NEGATIVE  NEGATIVE   Leukocytes, UA NEGATIVE  NEGATIVE  I-STAT TROPOININ, ED      Result Value Ref Range   Troponin i, poc 0.00  0.00 - 0.08 ng/mL   Comment 3           POC URINE PREG, ED      Result Value Ref Range   Preg Test, Ur NEGATIVE  NEGATIVE     Date:  07/12/2014  Rate: 80  Rhythm: normal sinus rhythm  QRS Axis: normal  Intervals: normal  ST/T  Wave abnormalities: normal  Conduction Disutrbances:none  Narrative Interpretation:   Old EKG Reviewed: unchanged     MDM   Final diagnoses:  Midline low back pain without sciatica        Martie Lee, PA-C 07/12/14 0220

## 2014-07-12 NOTE — ED Provider Notes (Signed)
12:25AM Pt not in exam room.  Martie Lee, PA-C 07/12/14 0025

## 2014-07-12 NOTE — Discharge Instructions (Signed)
Please followup with a primary care provider for continued evaluation of your low back pain.    Back Pain, Adult Low back pain is very common. About 1 in 5 people have back pain.The cause of low back pain is rarely dangerous. The pain often gets better over time.About half of people with a sudden onset of back pain feel better in just 2 weeks. About 8 in 10 people feel better by 6 weeks.  CAUSES Some common causes of back pain include:  Strain of the muscles or ligaments supporting the spine.  Wear and tear (degeneration) of the spinal discs.  Arthritis.  Direct injury to the back. DIAGNOSIS Most of the time, the direct cause of low back pain is not known.However, back pain can be treated effectively even when the exact cause of the pain is unknown.Answering your caregiver's questions about your overall health and symptoms is one of the most accurate ways to make sure the cause of your pain is not dangerous. If your caregiver needs more information, he or she may order lab work or imaging tests (X-rays or MRIs).However, even if imaging tests show changes in your back, this usually does not require surgery. HOME CARE INSTRUCTIONS For many people, back pain returns.Since low back pain is rarely dangerous, it is often a condition that people can learn to [facility] their own.   Remain active. It is stressful on the back to sit or stand in one place. Do not sit, drive, or stand in one place for more than 30 minutes at a time. Take short walks on level surfaces as soon as pain allows.Try to increase the length of time you walk each day.  Do not stay in bed.Resting more than 1 or 2 days can delay your recovery.  Do not avoid exercise or work.Your body is made to move.It is not dangerous to be active, even though your back may hurt.Your back will likely heal faster if you return to being active before your pain is gone.  Pay attention to your body when you bend and lift. Many people  have less discomfortwhen lifting if they bend their knees, keep the load close to their bodies,and avoid twisting. Often, the most comfortable positions are those that put less stress on your recovering back.  Find a comfortable position to sleep. Use a firm mattress and lie on your side with your knees slightly bent. If you lie on your back, put a pillow under your knees.  Only take over-the-counter or prescription medicines as directed by your caregiver. Over-the-counter medicines to reduce pain and inflammation are often the most helpful.Your caregiver may prescribe muscle relaxant drugs.These medicines help dull your pain so you can more quickly return to your normal activities and healthy exercise.  Put ice on the injured area.  Put ice in a plastic bag.  Place a towel between your skin and the bag.  Leave the ice on for 15-20 minutes, 03-04 times a day for the first 2 to 3 days. After that, ice and heat may be alternated to reduce pain and spasms.  Ask your caregiver about trying back exercises and gentle massage. This may be of some benefit.  Avoid feeling anxious or stressed.Stress increases muscle tension and can worsen back pain.It is important to recognize when you are anxious or stressed and learn ways to manage it.Exercise is a great option. SEEK MEDICAL CARE IF:  You have pain that is not relieved with rest or medicine.  You have pain that does  not improve in 1 week.  You have new symptoms.  You are generally not feeling well. SEEK IMMEDIATE MEDICAL CARE IF:   You have pain that radiates from your back into your legs.  You develop new bowel or bladder control problems.  You have unusual weakness or numbness in your arms or legs.  You develop nausea or vomiting.  You develop abdominal pain.  You feel faint. Document Released: 11/18/2005 Document Revised: 05/19/2012 Document Reviewed: 03/22/2014 [facility] Patient Information 2015 [place], [place]. This  information is not intended to replace advice given to you by your health care provider. Make sure you discuss any questions you have with your health care provider.     Back Exercises Back exercises help treat and prevent back injuries. The goal of back exercises is to increase the strength of your abdominal and back muscles and the flexibility of your back. These exercises should be started when you no longer have back pain. Back exercises include:  Pelvic Tilt. Lie on your back with your knees bent. Tilt your pelvis until the lower part of your back is against the floor. Hold this position 5 to 10 sec and repeat 5 to 10 times.  Knee to Chest. Pull first 1 knee up against your chest and hold for 20 to 30 seconds, repeat this with the other knee, and then both knees. This may be done with the other leg straight or bent, whichever feels better.  Sit-Ups or Curl-Ups. Bend your knees 90 degrees. Start with tilting your pelvis, and do a partial, slow sit-up, lifting your trunk only 30 to 45 degrees off the floor. Take at least 2 to 3 seconds for each sit-up. Do not do sit-ups with your knees out straight. If partial sit-ups are difficult, simply do the above but with only tightening your abdominal muscles and holding it as directed.  Hip-Lift. Lie on your back with your knees flexed 90 degrees. Push down with your feet and shoulders as you raise your hips a couple inches off the floor; hold for 10 seconds, repeat 5 to 10 times.  Back arches. Lie on your stomach, propping yourself up on bent elbows. Slowly press on your hands, causing an arch in your low back. Repeat 3 to 5 times. Any initial stiffness and discomfort should lessen with repetition over time.  Shoulder-Lifts. Lie face down with arms beside your body. Keep hips and torso pressed to floor as you slowly lift your head and shoulders off the floor. Do not overdo your exercises, especially in the beginning. Exercises may cause you some mild  back discomfort which lasts for a few minutes; however, if the pain is more severe, or lasts for more than 15 minutes, do not continue exercises until you see your caregiver. Improvement with exercise therapy for back problems is slow.  See your caregivers for assistance with developing a proper back exercise program. Document Released: 12/26/2004 Document Revised: 02/10/2012 Document Reviewed: 09/19/2011 [facility] Patient Information 2015 [place], [place]. This information is not intended to replace advice given to you by your health care provider. Make sure you discuss any questions you have with your health care provider.

## 2014-07-12 NOTE — ED Notes (Signed)
Patient discharged with all personal belongings. 

## 2014-07-12 NOTE — ED Notes (Signed)
Patient states she has been moving furniture and her back pain started a week ago while sitting on the [employer] bus and it has been progressively getting worse.

## 2014-07-15 ENCOUNTER — Emergency Department (HOSPITAL_COMMUNITY)
Admission: EM | Admit: 2014-07-15 | Discharge: 2014-07-15 | Disposition: A | Discharge status: Home / Self Care | Payer: PRIVATE HEALTH INSURANCE | Attending: Emergency Medicine | Admitting: Emergency Medicine

## 2014-07-15 DIAGNOSIS — Z79899 Other long term (current) drug therapy: Secondary | ICD-10-CM | POA: Insufficient documentation

## 2014-07-15 DIAGNOSIS — J45909 Unspecified asthma, uncomplicated: Secondary | ICD-10-CM | POA: Insufficient documentation

## 2014-07-15 DIAGNOSIS — R209 Unspecified disturbances of skin sensation: Secondary | ICD-10-CM | POA: Insufficient documentation

## 2014-07-15 DIAGNOSIS — M549 Dorsalgia, unspecified: Secondary | ICD-10-CM | POA: Insufficient documentation

## 2014-07-15 DIAGNOSIS — M545 Low back pain, unspecified: Secondary | ICD-10-CM | POA: Insufficient documentation

## 2014-07-15 DIAGNOSIS — Z87891 Personal history of nicotine dependence: Secondary | ICD-10-CM | POA: Insufficient documentation

## 2014-07-15 MED ORDER — PREDNISONE 20 MG PO TABS: 40.0000 mg | ORAL_TABLET | Freq: Every day | ORAL | Status: DC

## 2014-07-15 MED ORDER — HYDROCODONE-ACETAMINOPHEN 5-325 MG PO TABS: 1.0000 | ORAL_TABLET | ORAL | Status: DC | PRN

## 2014-07-15 NOTE — ED Notes (Addendum)
Pt was seen here 8/11 for back pain. Returns today because Tramadol and Flexeril are not helping and wants to "keep her job". C/O LBP radiating to right buttock and right leg with tingling in leg.

## 2014-07-15 NOTE — ED Provider Notes (Signed)
CSN: [id number]     Arrival date & time 07/15/14  1156 History  This chart was scribed for Vilinda Blanks. Mathews Argyle, working with Hoy Morn, MD by Steva Colder, ED Scribe. The patient was seen in room TR05C/TR05C at 12:25 PM.    Chief Complaint  Patient presents with  . Back Pain     The history is provided by the patient. No language interpreter was used.   HPI Comments: Jasmine Heath is a 43 y.o. female who presents to the Emergency Department complaining of back pain onset 3 days ago. She states that she obtained the back pain after moving furniture. She states that the pain didn't start until a couple days after. States initially pain was localized to her entire lumbar spine but has since localized to her right side.  Some radiation of pain into right leg with paresthesias but does not descend past the knee. denies numbness or weakness of right leg.  She states that she has tried Tramadol and Flexeril with no relief of her symptoms. She denies any other associated symptoms. She denies injuring her back in the past. No loss of bowel or bladder control.  No hx IVDU or cancer.  Past Medical History  Diagnosis Date  . Asthma    Past Surgical History  Procedure Laterality Date  . Foot surgery      I+D   Family History  Problem Relation Age of Onset  . Asthma Mother    History  Substance Use Topics  . Smoking status: Former Smoker    Quit date: 07/05/2012  . Smokeless tobacco: Not on file  . Alcohol Use: Yes     Comment: occ beer and liquor   OB History   Grav Para Term Preterm Abortions TAB SAB Ect Mult Living                 Review of Systems  Musculoskeletal: Positive for back pain.  Neurological: Positive for numbness (in the right leg).       Tingling in the right leg   All other systems reviewed and are negative.     Allergies  Review of patient's allergies indicates no known allergies.  Home Medications   Prior to Admission medications   Medication Sig  Start Date End Date Taking? Authorizing Provider  albuterol (PROVENTIL HFA;VENTOLIN HFA) 108 (90 BASE) MCG/ACT inhaler Inhale 1-2 puffs into the lungs every 6 (six) hours as needed for wheezing or shortness of breath. 11/17/12   Modena Jansky, MD  cyclobenzaprine (FLEXERIL) 10 MG tablet Take 1 tablet (10 mg total) by mouth 3 (three) times daily as needed for muscle spasms. 07/12/14   Ruthell Rummage Dammen, PA-C  senna-docusate (STOOL SOFTENER & LAXATIVE) 8.6-50 MG per tablet Take 2 tablets by mouth at bedtime as needed for mild constipation (constipation).    Historical Provider, MD  simethicone (MYLICON) 80 MG chewable tablet Chew 80 mg by mouth every 6 (six) hours as needed for flatulence (Flatulance).    Historical Provider, MD  traMADol (ULTRAM) 50 MG tablet Take 1 tablet (50 mg total) by mouth every 6 (six) hours as needed. 07/12/14   Ruthell Rummage Dammen, PA-C   BP 104/75  Pulse 99  Temp(Src) 97.5 F (36.4 C) (Oral)  Resp 20  SpO2 97%  Physical Exam  Nursing note and vitals reviewed. Constitutional: She is oriented to person, place, and time. She appears well-developed and well-nourished. No distress.  HENT:  Head: Normocephalic and atraumatic.  Mouth/Throat: Oropharynx  is clear and moist.  Eyes: Conjunctivae and EOM are normal. Pupils are equal, round, and reactive to light.  Neck: Normal range of motion. Neck supple.  Cardiovascular: Normal rate, regular rhythm and normal heart sounds.   Pulmonary/Chest: Effort normal and breath sounds normal.  Musculoskeletal: Normal range of motion.  Endorses pain of right lumbar paraspinal region, but no focal tenderness. No mid-line tenderness or deformities noted; full ROM maintained; normal strength and sensation of BLE; ambulating unassisted without difficulty  Neurological: She is alert and oriented to person, place, and time.  Skin: Skin is warm and dry. She is not diaphoretic.  Psychiatric: She has a normal mood and affect.    ED Course   Procedures (including critical care time) DIAGNOSTIC STUDIES: Oxygen Saturation is 97% on room air, normal by my interpretation.    COORDINATION OF CARE: 12:28 PM-Discussed treatment plan which includes Norco and Prednisone with pt at bedside and pt agreed to plan.   Labs Review Labs Reviewed - No data to display  Imaging Review No results found.   EKG Interpretation None      MDM   Final diagnoses:  Back pain, unspecified location   Back pain after moving furniture, seen 3 days ago for the same.  No red flag sx on exam today to suggest cauda equina, SCI, or other serious pathology.  Main concern is that pain is not improving.  Will change pain medication to vicodin and prednisone.  FU with PCP.  Discussed plan with patient, he/she acknowledged understanding and agreed with plan of care.  Return precautions given for new or worsening symptoms.  I personally performed the services described in this documentation, which was scribed in my presence. The recorded information has been reviewed and is accurate.    Larene Pickett, PA-C 07/15/14 1249

## 2014-07-15 NOTE — Discharge Instructions (Signed)
Take the prescribed medication as directed. °Return to the ED for new or worsening symptoms. ° °

## 2014-07-15 NOTE — ED Provider Notes (Signed)
Medical screening examination/treatment/procedure(s) were performed by non-physician practitioner and as supervising physician I was immediately available for consultation/collaboration.   EKG Interpretation None        Hoy Morn, MD 07/15/14 2206

## 2014-07-15 NOTE — ED Notes (Signed)
Was seen recently for same pain in back after moving  Furniture states pain meds not helping

## 2014-07-28 ENCOUNTER — Emergency Department (HOSPITAL_COMMUNITY)
Admission: EM | Admit: 2014-07-28 | Discharge: 2014-07-28 | Disposition: A | Discharge status: Home / Self Care | Payer: PRIVATE HEALTH INSURANCE | Attending: Emergency Medicine | Admitting: Emergency Medicine

## 2014-07-28 ENCOUNTER — Encounter (HOSPITAL_COMMUNITY): Payer: Self-pay | Admitting: Emergency Medicine

## 2014-07-28 DIAGNOSIS — R51 Headache: Secondary | ICD-10-CM | POA: Insufficient documentation

## 2014-07-28 DIAGNOSIS — K089 Disorder of teeth and supporting structures, unspecified: Secondary | ICD-10-CM | POA: Insufficient documentation

## 2014-07-28 DIAGNOSIS — K0381 Cracked tooth: Secondary | ICD-10-CM | POA: Insufficient documentation

## 2014-07-28 DIAGNOSIS — Z87891 Personal history of nicotine dependence: Secondary | ICD-10-CM | POA: Insufficient documentation

## 2014-07-28 DIAGNOSIS — Z79899 Other long term (current) drug therapy: Secondary | ICD-10-CM | POA: Insufficient documentation

## 2014-07-28 DIAGNOSIS — J45909 Unspecified asthma, uncomplicated: Secondary | ICD-10-CM | POA: Insufficient documentation

## 2014-07-28 DIAGNOSIS — K0401 Reversible pulpitis: Secondary | ICD-10-CM

## 2014-07-28 MED ORDER — HYDROCODONE-ACETAMINOPHEN 5-325 MG PO TABS: ORAL_TABLET | ORAL | Status: DC

## 2014-07-28 MED ORDER — AMOXICILLIN 500 MG PO CAPS: 500.0000 mg | ORAL_CAPSULE | Freq: Three times a day (TID) | ORAL | Status: DC

## 2014-07-28 MED ORDER — HYDROCODONE-ACETAMINOPHEN 5-325 MG PO TABS
2.0000 | ORAL_TABLET | Freq: Once | ORAL | Status: AC
  Administered 2014-07-28: 2 via ORAL
  Filled 2014-07-28: qty 2

## 2014-07-28 NOTE — ED Provider Notes (Signed)
  Chief Complaint   Chief Complaint  Patient presents with  . Dental Pain    History of Present Illness   Jasmine Heath is a 43 year old female who had a two-week history of pain in her left upper and lower molars and this is been associated with some headache, facial pain, ear pain, throat pain, neck pain. She denies any fever. It hurts to chew on that side. The teeth are cracked and broken. She able open her mouth fully. She denies any difficulty swallowing or breathing. She's had no fever, chills, chest pain, or shortness of breath.  Review of Systems   Other than as noted above, the patient denies any of the following symptoms: Systemic:  No fever or chills. ENT:  No headache, ear ache, sore throat, nasal congestion, facial pain, or swelling. Lymphatic:  No adenopathy. Lungs:  No coughing or shortness of breath.  [place]   Past medical history, family history, social history, meds, and allergies were reviewed. She has asthma and uses albuterol.  Physical Examination     Vital signs:  BP 108/61  Pulse 72  Temp(Src) 97.7 F (36.5 C) (Oral)  Resp 14  Ht 5\' 6"  (1.676 m)  Wt 270 lb (122.471 kg)  BMI 43.60 kg/m2  SpO2 95% General:  Alert, oriented, in no distress. ENT:  TMs and canals normal.  Nasal mucosa normal. Mouth exam:  She has widespread dental decay. The left second upper and lower molars are broken, decayed, and painful to touch. There is no swelling of the gingiva, no collection of pus. The floor the mouth and tongue are normal and the pharynx is clear and widely patent. Neck:  No swelling or adenopathy. Lungs:  Breath sounds clear and equal bilaterally.  No wheezes, rales or rhonchi. Heart:  Regular rhythm.  No gallops or murmers. Skin:  Clear, warm and dry.   Assessment   The encounter diagnosis was Pulpitis.  No evidence of Ludwig's angina.    Plan   1.  Meds:  The following meds were prescribed:   Discharge Medication List as of 07/28/2014  5:55 PM     START taking these medications   Details  amoxicillin (AMOXIL) 500 MG capsule Take 1 capsule (500 mg total) by mouth 3 (three) times daily., Starting 07/28/2014, Until Discontinued, Print    HYDROcodone-acetaminophen (NORCO/VICODIN) 5-325 MG per tablet 1 to 2 tabs every 4 to 6 hours as needed for pain., Print        2.  Patient Education/Counseling:  The patient was given appropriate handouts, self care instructions, and instructed in symptomatic relief. Suggested sleeping with head of bed elevated and hot salt water mouthwash.   3.  Follow up:  The patient was told to follow up if no better in 3 to 4 days, if becoming worse in any way, and given some red flag symptoms such as difficulty swallowing or breathing which would prompt immediate return.  Follow up with a dentist as soon as posssible.     Harden Mo, MD 07/28/14 2013

## 2014-07-28 NOTE — Discharge Instructions (Signed)
Look up the [facility] [employer]'s [facility] for free dental clinics. Http://www.ncdental.org/ncds/Schedule.asp ° °Get there early and be prepared to wait. [employer] and [employer] have dental hygienist schools that provide low cost routine dental care.  ° °Other resources: °[facility] °[address] °[place], [place] °([phone] ° °Patients with Medicaid: °[facility]                     [facility] °[address]                                [address] °Phone:  [phone]                                                  Phone:  [phone] ° °Dr. Janice Civils °[address] °[phone] ° °If unable to pay or uninsured, contact:  [facility] or [facility] to become qualified for the adult dental clinic. ° °No matter what dental problem you have, it will not get better unless you get good dental care.  If the tooth is not taken care of, your symptoms will come back in time and you will be visiting us again in the Urgent Care Center with a bad toothache.  So, see your dentist as soon as possible.  If you don't have a dentist, we can give you a list of dentists.  Sometimes the most cost effective treatment is removal of the tooth.  This can be done very inexpensively through one of the low cost [facility] Centers such as the facility on [address] in [place] (1-800-336-8873).  The downside to this is that you will have one less tooth and this can effect your ability to chew. ° °Some other things that can be done for a dental infection include the following: ° °· Rinse your mouth out with hot salt water (1/2 tsp of table salt and a pinch of baking soda in 8 oz of hot water).  You can do this every 2 or 3 hours. °· Avoid cold foods, beverages, and cold air.  This will make your symptoms worse. °· Sleep with your head elevated.  Sleeping flat will cause your gums and oral tissues to swell and make them hurt  more.  You can sleep on several pillows.  Even better is to sleep in a recliner with your head higher than your heart. °· For mild to moderate pain, you can take Tylenol, ibuprofen, or Aleve. °· External application of heat by a heating pad, hot water bottle, or hot wet towel can help with pain and speed healing.  You can do this every 2 to 3 hours. Do not fall asleep on a heating pad since this can cause a burn.  °·  °

## 2014-07-28 NOTE — ED Notes (Signed)
Presents with left upper and lower tooth pain began 2 weeks ago-reports that she had a dentist appointment and they told her it was infected and she needed to get rid of infection before the tooth was pulled.

## 2015-05-26 ENCOUNTER — Emergency Department (HOSPITAL_COMMUNITY)
Admission: EM | Admit: 2015-05-26 | Discharge: 2015-05-26 | Disposition: A | Discharge status: Home / Self Care | Payer: Medicaid Other | Attending: Emergency Medicine | Admitting: Emergency Medicine

## 2015-05-26 ENCOUNTER — Encounter (HOSPITAL_COMMUNITY): Payer: Self-pay | Admitting: Emergency Medicine

## 2015-05-26 DIAGNOSIS — J4521 Mild intermittent asthma with (acute) exacerbation: Secondary | ICD-10-CM | POA: Insufficient documentation

## 2015-05-26 DIAGNOSIS — K029 Dental caries, unspecified: Secondary | ICD-10-CM | POA: Diagnosis not present

## 2015-05-26 DIAGNOSIS — Z79899 Other long term (current) drug therapy: Secondary | ICD-10-CM | POA: Insufficient documentation

## 2015-05-26 DIAGNOSIS — J029 Acute pharyngitis, unspecified: Secondary | ICD-10-CM | POA: Insufficient documentation

## 2015-05-26 DIAGNOSIS — H9201 Otalgia, right ear: Secondary | ICD-10-CM | POA: Diagnosis not present

## 2015-05-26 DIAGNOSIS — Z792 Long term (current) use of antibiotics: Secondary | ICD-10-CM | POA: Insufficient documentation

## 2015-05-26 DIAGNOSIS — J4520 Mild intermittent asthma, uncomplicated: Secondary | ICD-10-CM

## 2015-05-26 DIAGNOSIS — K088 Other specified disorders of teeth and supporting structures: Secondary | ICD-10-CM | POA: Diagnosis present

## 2015-05-26 DIAGNOSIS — E669 Obesity, unspecified: Secondary | ICD-10-CM | POA: Diagnosis not present

## 2015-05-26 DIAGNOSIS — Z87891 Personal history of nicotine dependence: Secondary | ICD-10-CM | POA: Insufficient documentation

## 2015-05-26 MED ORDER — ALBUTEROL SULFATE (2.5 MG/3ML) 0.083% IN NEBU
5.0000 mg | INHALATION_SOLUTION | Freq: Once | RESPIRATORY_TRACT | Status: AC
  Administered 2015-05-26: 5 mg via RESPIRATORY_TRACT
  Filled 2015-05-26: qty 6

## 2015-05-26 MED ORDER — CHLORHEXIDINE GLUCONATE 0.12 % MT SOLN: 15.0000 mL | Freq: Two times a day (BID) | OROMUCOSAL | Status: DC

## 2015-05-26 MED ORDER — AMOXICILLIN 500 MG PO CAPS: 500.0000 mg | ORAL_CAPSULE | Freq: Three times a day (TID) | ORAL | Status: DC

## 2015-05-26 MED ORDER — ALBUTEROL SULFATE HFA 108 (90 BASE) MCG/ACT IN AERS
2.0000 | INHALATION_SPRAY | RESPIRATORY_TRACT | Status: DC | PRN
  Administered 2015-05-26: 2 via RESPIRATORY_TRACT
  Filled 2015-05-26: qty 6.7

## 2015-05-26 MED ORDER — IPRATROPIUM BROMIDE 0.02 % IN SOLN
0.5000 mg | Freq: Once | RESPIRATORY_TRACT | Status: AC
  Administered 2015-05-26: 0.5 mg via RESPIRATORY_TRACT
  Filled 2015-05-26: qty 2.5

## 2015-05-26 NOTE — Discharge Instructions (Signed)
Asthma Asthma is a condition of the lungs in which the airways tighten and narrow. Asthma can make it hard to breathe. Asthma cannot be cured, but medicine and lifestyle changes can help control it. Asthma may be started (triggered) by:  Animal skin flakes (dander).  Dust.  Cockroaches.  Pollen.  Mold.  Smoke.  Cleaning products.  Hair sprays or aerosol sprays.  Paint fumes or strong smells.  Cold air, weather changes, and winds.  Crying or laughing hard.  Stress.  Certain medicines or drugs.  Foods, such as dried fruit, potato chips, and sparkling grape juice.  Infections or conditions (colds, flu).  Exercise.  Certain medical conditions or diseases.  Exercise or tiring activities. HOME CARE   Take medicine as told by your doctor.  Use a peak flow meter as told by your doctor. A peak flow meter is a tool that measures how well the lungs are working.  Record and keep track of the peak flow meter's readings.  Understand and use the asthma action plan. An asthma action plan is a written plan for taking care of your asthma and treating your attacks.  To help prevent asthma attacks:  Do not smoke. Stay away from secondhand smoke.  Change your heating and air conditioning filter often.  Limit your use of fireplaces and wood stoves.  Get rid of pests (such as roaches and mice) and their droppings.  Throw away plants if you see mold on them.  Clean your floors. Dust regularly. Use cleaning products that do not smell.  Have someone vacuum when you are not home. Use a vacuum cleaner with a HEPA filter if possible.  Replace carpet with wood, tile, or vinyl flooring. Carpet can trap animal skin flakes and dust.  Use allergy-proof pillows, mattress covers, and box spring covers.  Wash bed sheets and blankets every week in hot water and dry them in a dryer.  Use blankets that are made of polyester or cotton.  Clean bathrooms and kitchens with bleach. If  possible, have someone repaint the walls in these rooms with mold-resistant paint. Keep out of the rooms that are being cleaned and painted.  Wash hands often. GET HELP IF:  You have make a whistling sound when breaking (wheeze), have shortness of breath, or have a cough even if taking medicine to prevent attacks.  The colored mucus you cough up (sputum) is thicker than usual.  The colored mucus you cough up changes from clear or white to yellow, green, gray, or bloody.  You have problems from the medicine you are taking such as:  A rash.  Itching.  Swelling.  Trouble breathing.  You need reliever medicines more than 2-3 times a week.  Your peak flow measurement is still at 50-79% of your personal best after following the action plan for 1 hour.  You have a fever. GET HELP RIGHT AWAY IF:   You seem to be worse and are not responding to medicine during an asthma attack.  You are short of breath even at rest.  You get short of breath when doing very little activity.  You have trouble eating, drinking, or talking.  You have chest pain.  You have a fast heartbeat.  Your lips or fingernails start to turn blue.  You are light-headed, dizzy, or faint.  Your peak flow is less than 50% of your personal best. MAKE SURE YOU:   Understand these instructions.  Will watch your condition.  Will get help right away if you  are not doing well or get worse. Document Released: 05/06/2008 Document Revised: 04/04/2014 Document Reviewed: 06/17/2013 [facility] Patient Information 2015 [place], [place]. This information is not intended to replace advice given to you by your health care provider. Make sure you discuss any questions you have with your health care provider.  Dental Care and Dentist Visits Dental care supports good overall health. Regular dental visits can also help you avoid dental pain, bleeding, infection, and other more serious health problems in the future. It is  important to keep the mouth healthy because diseases in the teeth, gums, and other oral tissues can spread to other areas of the body. Some problems, such as diabetes, heart disease, and pre-term labor have been associated with poor oral health.  See your dentist every 6 months. If you experience emergency problems such as a toothache or broken tooth, go to the dentist right away. If you see your dentist regularly, you may catch problems early. It is easier to be treated for problems in the early stages.  WHAT TO EXPECT AT A DENTIST VISIT  Your dentist will look for many common oral health problems and recommend proper treatment. At your regular dental visit, you can expect:  Gentle cleaning of the teeth and gums. This includes scraping and polishing. This helps to remove the sticky substance around the teeth and gums (plaque). Plaque forms in the mouth shortly after eating. Over time, plaque hardens on the teeth as tartar. If tartar is not removed regularly, it can cause problems. Cleaning also helps remove stains.  Periodic X-rays. These pictures of the teeth and supporting bone will help your dentist assess the health of your teeth.  Periodic fluoride treatments. Fluoride is a natural mineral shown to help strengthen teeth. Fluoride treatmentinvolves applying a fluoride gel or varnish to the teeth. It is most commonly done in children.  Examination of the mouth, tongue, jaws, teeth, and gums to look for any oral health problems, such as:  Cavities (dental caries). This is decay on the tooth caused by plaque, sugar, and acid in the mouth. It is best to catch a cavity when it is small.  Inflammation of the gums caused by plaque buildup (gingivitis).  Problems with the mouth or malformed or misaligned teeth.  Oral cancer or other diseases of the soft tissues or jaws. KEEP YOUR TEETH AND GUMS HEALTHY For healthy teeth and gums, follow these general guidelines as well as your dentist's specific  advice:  Have your teeth professionally cleaned at the dentist every 6 months.  Brush twice daily with a fluoride toothpaste.  Floss your teeth daily.  Ask your dentist if you need fluoride supplements, treatments, or fluoride toothpaste.  Eat a healthy diet. Reduce foods and drinks with added sugar.  Avoid smoking. TREATMENT FOR ORAL HEALTH PROBLEMS If you have oral health problems, treatment varies depending on the conditions present in your teeth and gums.  Your caregiver will most likely recommend good oral hygiene at each visit.  For cavities, gingivitis, or other oral health disease, your caregiver will perform a procedure to treat the problem. This is typically done at a separate appointment. Sometimes your caregiver will refer you to another dental specialist for specific tooth problems or for surgery. SEEK IMMEDIATE DENTAL CARE IF:  You have pain, bleeding, or soreness in the gum, tooth, jaw, or mouth area.  A permanent tooth becomes loose or separated from the gum socket.  You experience a blow or injury to the mouth or jaw area.  Document Released: 07/31/2011 Document Revised: 02/10/2012 Document Reviewed: 07/31/2011 [facility] Patient Information 2015 [place], [place]. This information is not intended to replace advice given to you by your health care provider. Make sure you discuss any questions you have with your health care provider.  Dental Care and Dentist Visits Dental care supports good overall health. Regular dental visits can also help you avoid dental pain, bleeding, infection, and other more serious health problems in the future. It is important to keep the mouth healthy because diseases in the teeth, gums, and other oral tissues can spread to other areas of the body. Some problems, such as diabetes, heart disease, and pre-term labor have been associated with poor oral health.  See your dentist every 6 months. If you experience emergency problems such as a toothache  or broken tooth, go to the dentist right away. If you see your dentist regularly, you may catch problems early. It is easier to be treated for problems in the early stages.  WHAT TO EXPECT AT A DENTIST VISIT  Your dentist will look for many common oral health problems and recommend proper treatment. At your regular dental visit, you can expect:  Gentle cleaning of the teeth and gums. This includes scraping and polishing. This helps to remove the sticky substance around the teeth and gums (plaque). Plaque forms in the mouth shortly after eating. Over time, plaque hardens on the teeth as tartar. If tartar is not removed regularly, it can cause problems. Cleaning also helps remove stains.  Periodic X-rays. These pictures of the teeth and supporting bone will help your dentist assess the health of your teeth.  Periodic fluoride treatments. Fluoride is a natural mineral shown to help strengthen teeth. Fluoride treatmentinvolves applying a fluoride gel or varnish to the teeth. It is most commonly done in children.  Examination of the mouth, tongue, jaws, teeth, and gums to look for any oral health problems, such as:  Cavities (dental caries). This is decay on the tooth caused by plaque, sugar, and acid in the mouth. It is best to catch a cavity when it is small.  Inflammation of the gums caused by plaque buildup (gingivitis).  Problems with the mouth or malformed or misaligned teeth.  Oral cancer or other diseases of the soft tissues or jaws. KEEP YOUR TEETH AND GUMS HEALTHY For healthy teeth and gums, follow these general guidelines as well as your dentist's specific advice:  Have your teeth professionally cleaned at the dentist every 6 months.  Brush twice daily with a fluoride toothpaste.  Floss your teeth daily.  Ask your dentist if you need fluoride supplements, treatments, or fluoride toothpaste.  Eat a healthy diet. Reduce foods and drinks with added sugar.  Avoid  smoking. TREATMENT FOR ORAL HEALTH PROBLEMS If you have oral health problems, treatment varies depending on the conditions present in your teeth and gums.  Your caregiver will most likely recommend good oral hygiene at each visit.  For cavities, gingivitis, or other oral health disease, your caregiver will perform a procedure to treat the problem. This is typically done at a separate appointment. Sometimes your caregiver will refer you to another dental specialist for specific tooth problems or for surgery. SEEK IMMEDIATE DENTAL CARE IF:  You have pain, bleeding, or soreness in the gum, tooth, jaw, or mouth area.  A permanent tooth becomes loose or separated from the gum socket.  You experience a blow or injury to the mouth or jaw area. Document Released: 07/31/2011 Document Revised: 02/10/2012 Document  Reviewed: 07/31/2011 [employer] Patient Information 2015 [place], [place]. This information is not intended to replace advice given to you by your health care provider. Make sure you discuss any questions you have with your health care provider.

## 2015-05-26 NOTE — ED Provider Notes (Signed)
CSN: [id number]     Arrival date & time 05/26/15  1100 History   First MD Initiated Contact with Patient 05/26/15 1313     Chief Complaint  Patient presents with  . Dental Pain  . Sore Throat  . Otalgia     (Consider location/radiation/quality/duration/timing/severity/associated sxs/prior Treatment) HPI`  44 year old female with history of asthma presents with URI symptoms. Patient reports she has an infected tooth on her left lower jaw that has been hurting intermittently for more than a year. She had been seen by a dentist in the past who recommend to have dental extraction but patient declined at that time. For the past 3 days she has had worsening sharp throbbing pain to the affected tooth, worsening with cold water and with chewing. Since yesterday she also complaining of right ear pain, sore throats, occasional cough that is nonproductive and worsening shortness of breath. She endorse wheezing as well. She has asthma which is fairly well-controlled. She ran out of her inhaler several months ago but before that she would use it as needed once or twice a month. At this time she denies having any fever, headache, neck stiffness, pleuritic chest pain, hemoptysis, abdominal cramping, nausea vomiting or diarrhea. She did take some ibuprofen at home which provide some relief. No other treatment tried.  Past Medical History  Diagnosis Date  . Asthma    Past Surgical History  Procedure Laterality Date  . Foot surgery      I+D   Family History  Problem Relation Age of Onset  . Asthma Mother    History  Substance Use Topics  . Smoking status: Former Smoker    Quit date: 07/05/2012  . Smokeless tobacco: Not on file  . Alcohol Use: Yes     Comment: occ beer and liquor   OB History    No data available     Review of Systems  All other systems reviewed and are negative.     Allergies  Review of patient's allergies indicates no known allergies.  Home Medications   Prior to  Admission medications   Medication Sig Start Date End Date Taking? Authorizing Provider  albuterol (PROVENTIL HFA;VENTOLIN HFA) 108 (90 BASE) MCG/ACT inhaler Inhale 1-2 puffs into the lungs every 6 (six) hours as needed for wheezing or shortness of breath. 11/17/12   Modena Jansky, MD  amoxicillin (AMOXIL) 500 MG capsule Take 1 capsule (500 mg total) by mouth 3 (three) times daily. 07/28/14   Harden Mo, MD  HYDROcodone-acetaminophen (NORCO/VICODIN) 5-325 MG per tablet 1 to 2 tabs every 4 to 6 hours as needed for pain. 07/28/14   Harden Mo, MD   BP 123/36 mmHg  Pulse 79  Temp(Src) 98 F (36.7 C) (Oral)  Resp 18  SpO2 96%  LMP 05/24/2015 Physical Exam  Constitutional: She appears well-developed and well-nourished. No distress.  Obese African-American female, nontoxic in appearance, in no acute distress.  HENT:  Head: Atraumatic.  Ears: Normal ear canals bilaterally, normal TM without effusion or erythema.  Nose: Normal nares Throat: Uvula is midline, no tonsillar enlargement or exudates, no trismus. Significant dental decay noted to tooth #20 with tenderness to palpation but no obvious abscess or gingival erythema.   Eyes: Conjunctivae are normal.  Neck: Neck supple.  Cardiovascular: Normal rate, regular rhythm and intact distal pulses.   Pulmonary/Chest: She has wheezes (Faint expiratory wheezes, no rales or rhonchi).  Musculoskeletal: She exhibits no edema.  Lymphadenopathy:    She has no cervical  adenopathy.  Neurological: She is alert.  Skin: No rash noted.  Psychiatric: She has a normal mood and affect.  Nursing note and vitals reviewed.   ED Course  Procedures (including critical care time)  Patient here with URI symptoms along with dental pain. She does have some active wheezing but no hypoxia and no fever concerning for pneumonia. Dental pain secondary to a dental decay. Plan to prescribe patient ibuprofen for pain, and amoxicillin for dental infection. She will  follow-up with dentist for the care. Patient will receive a DuoNeb treatment in the ED for wheezing. She will be discharged with an albuterol inhaler to use as needed.  2:35 PM Breathing improved after receiving DuoNeb treatment. Patient given albuterol inhaler. Dental referral provided. Patient was discharged with amoxicillin, and Peridex. Return precautions discussed.  Labs Review Labs Reviewed - No data to display  Imaging Review No results found.   EKG Interpretation None      MDM   Final diagnoses:  Pain due to dental caries  Asthma, mild intermittent, uncomplicated    BP 600/45 mmHg  Pulse 71  Temp(Src) 98 F (36.7 C) (Oral)  Resp 16  SpO2 100%  LMP 05/24/2015     Domenic Moras, PA-C 05/26/15 [place], MD 05/27/15 [phone]

## 2015-05-26 NOTE — ED Notes (Signed)
  Pt o2 96%-98% while ambulating

## 2015-05-26 NOTE — ED Notes (Signed)
Pt. Stated, I've had ear, throat and teeth pain which is making my asthma act up

## 2015-11-13 ENCOUNTER — Emergency Department (HOSPITAL_COMMUNITY)
Admission: EM | Admit: 2015-11-13 | Discharge: 2015-11-13 | Disposition: A | Discharge status: Home / Self Care | Payer: Self-pay | Attending: Emergency Medicine | Admitting: Emergency Medicine

## 2015-11-13 ENCOUNTER — Encounter (HOSPITAL_COMMUNITY): Payer: Self-pay

## 2015-11-13 ENCOUNTER — Other Ambulatory Visit: Payer: Self-pay

## 2015-11-13 ENCOUNTER — Emergency Department (HOSPITAL_COMMUNITY): Payer: PRIVATE HEALTH INSURANCE

## 2015-11-13 DIAGNOSIS — J4521 Mild intermittent asthma with (acute) exacerbation: Secondary | ICD-10-CM | POA: Insufficient documentation

## 2015-11-13 DIAGNOSIS — R079 Chest pain, unspecified: Secondary | ICD-10-CM | POA: Insufficient documentation

## 2015-11-13 DIAGNOSIS — R091 Pleurisy: Secondary | ICD-10-CM | POA: Insufficient documentation

## 2015-11-13 DIAGNOSIS — Z79899 Other long term (current) drug therapy: Secondary | ICD-10-CM | POA: Insufficient documentation

## 2015-11-13 DIAGNOSIS — J40 Bronchitis, not specified as acute or chronic: Secondary | ICD-10-CM

## 2015-11-13 DIAGNOSIS — F17200 Nicotine dependence, unspecified, uncomplicated: Secondary | ICD-10-CM | POA: Insufficient documentation

## 2015-11-13 DIAGNOSIS — Z792 Long term (current) use of antibiotics: Secondary | ICD-10-CM | POA: Insufficient documentation

## 2015-11-13 LAB — CBC
HCT: 34.3 % — ABNORMAL LOW (ref 36.0–46.0)
HEMOGLOBIN: 11.3 g/dL — AB (ref 12.0–15.0)
MCH: 29.8 pg (ref 26.0–34.0)
MCHC: 32.9 g/dL (ref 30.0–36.0)
MCV: 90.5 fL (ref 78.0–100.0)
Platelets: 310 10*3/uL (ref 150–400)
RBC: 3.79 MIL/uL — AB (ref 3.87–5.11)
RDW: 13.8 % (ref 11.5–15.5)
WBC: 6.4 10*3/uL (ref 4.0–10.5)

## 2015-11-13 LAB — BASIC METABOLIC PANEL
ANION GAP: 7 (ref 5–15)
BUN: 10 mg/dL (ref 6–20)
CHLORIDE: 106 mmol/L (ref 101–111)
CO2: 27 mmol/L (ref 22–32)
Calcium: 9.3 mg/dL (ref 8.9–10.3)
Creatinine, Ser: 0.85 mg/dL (ref 0.44–1.00)
GFR calc non Af Amer: >60 mL/min (ref >60)
GLUCOSE: 102 mg/dL — AB (ref 65–99)
Potassium: 3.6 mmol/L (ref 3.5–5.1)
Sodium: 140 mmol/L (ref 135–145)

## 2015-11-13 LAB — I-STAT TROPONIN, ED: Troponin i, poc: 0 ng/mL (ref 0.00–0.08)

## 2015-11-13 MED ORDER — AZITHROMYCIN 250 MG PO TABS
500.0000 mg | ORAL_TABLET | Freq: Once | ORAL | Status: AC
  Administered 2015-11-13: 500 mg via ORAL
  Filled 2015-11-13: qty 2

## 2015-11-13 MED ORDER — AZITHROMYCIN 250 MG PO TABS: 250.0000 mg | ORAL_TABLET | Freq: Every day | ORAL | Status: DC

## 2015-11-13 MED ORDER — HYDROCODONE-HOMATROPINE 5-1.5 MG/5ML PO SYRP: 5.0000 mL | ORAL_SOLUTION | Freq: Four times a day (QID) | ORAL | Status: DC | PRN

## 2015-11-13 MED ORDER — PREDNISONE 20 MG PO TABS
60.0000 mg | ORAL_TABLET | Freq: Once | ORAL | Status: AC
  Administered 2015-11-13: 60 mg via ORAL
  Filled 2015-11-13: qty 3

## 2015-11-13 MED ORDER — PREDNISONE 20 MG PO TABS: 60.0000 mg | ORAL_TABLET | Freq: Every day | ORAL | Status: DC

## 2015-11-13 MED ORDER — IPRATROPIUM-ALBUTEROL 0.5-2.5 (3) MG/3ML IN SOLN
3.0000 mL | Freq: Once | RESPIRATORY_TRACT | Status: AC
  Administered 2015-11-13: 3 mL via RESPIRATORY_TRACT
  Filled 2015-11-13: qty 3

## 2015-11-13 NOTE — ED Notes (Signed)
Pt started having chest pain with her cough and a productive cough with yellow mucus 4-5 days ago.

## 2015-11-13 NOTE — ED Notes (Signed)
Taken to xray at this time. 

## 2015-11-13 NOTE — ED Provider Notes (Signed)
CSN: [id number]     Arrival date & time 11/13/15  [id number] History  By signing my name below, I, Jasmine Heath, attest that this documentation has been prepared under the direction and in the presence of Jasmine Greek, MD. Electronically Signed: Altamease Heath, ED Scribe. 11/13/2015. 1:13 AM   Chief Complaint  Patient presents with  . Cough  . Pleurisy    The history is provided by the patient. No language interpreter was used.  Jasmine Heath is a 44 y.o. female with history of asthma who presents to the Emergency Department complaining of a cough productive of yellow sputum with onset 4-5 days ago. Associated symptoms include right-sided pleuritic chest pain and chest tightness. She is out of albuterol at home. Pt denies fever.   Past Medical History  Diagnosis Date  . Asthma    Past Surgical History  Procedure Laterality Date  . Foot surgery      I+D   Family History  Problem Relation Age of Onset  . Asthma Mother    Social History  Substance Use Topics  . Smoking status: Current Every Day Smoker  . Smokeless tobacco: None  . Alcohol Use: Yes     Comment: occ beer and liquor   OB History    No data available     Review of Systems  Constitutional: Negative for fever.  Respiratory: Positive for cough and chest tightness.   Cardiovascular: Positive for chest pain.  All other systems reviewed and are negative.  Allergies  Review of patient's allergies indicates no known allergies.  Home Medications   Prior to Admission medications   Medication Sig Start Date End Date Taking? Authorizing Provider  albuterol (PROVENTIL HFA;VENTOLIN HFA) 108 (90 BASE) MCG/ACT inhaler Inhale 1-2 puffs into the lungs every 6 (six) hours as needed for wheezing or shortness of breath. 11/17/12   Modena Jansky, MD  amoxicillin (AMOXIL) 500 MG capsule Take 1 capsule (500 mg total) by mouth 3 (three) times daily. 05/26/15   Domenic Moras, PA-C  chlorhexidine (PERIDEX) 0.12 % solution  Use as directed 15 mLs in the mouth or throat 2 (two) times daily. 05/26/15   Domenic Moras, PA-C  HYDROcodone-acetaminophen (NORCO/VICODIN) 5-325 MG per tablet 1 to 2 tabs every 4 to 6 hours as needed for pain. 07/28/14   Harden Mo, MD   BP 108/52 mmHg  Pulse 81  Temp(Src) 98 F (36.7 C) (Oral)  Resp 16  SpO2 97%  LMP 11/06/2015 Physical Exam  Constitutional: She is oriented to person, place, and time. She appears well-developed and well-nourished. No distress.  HENT:  Head: Normocephalic and atraumatic.  Right Ear: Hearing normal.  Left Ear: Hearing normal.  Nose: Nose normal.  Mouth/Throat: Oropharynx is clear and moist and mucous membranes are normal.  Eyes: Conjunctivae and EOM are normal. Pupils are equal, round, and reactive to light.  Neck: Normal range of motion. Neck supple.  Cardiovascular: Regular rhythm, S1 normal and S2 normal.  Exam reveals no gallop and no friction rub.   No murmur heard. Pulmonary/Chest: Effort normal. No respiratory distress. She has decreased breath sounds. She has no wheezes. She has no rhonchi. She has no rales. She exhibits no tenderness.  Mildly diminished breath sounds bilaterally.  Abdominal: Soft. Normal appearance and bowel sounds are normal. There is no hepatosplenomegaly. There is no tenderness. There is no rebound, no guarding, no tenderness at McBurney's point and negative Murphy's sign. No hernia.  Musculoskeletal: Normal range of motion.  Neurological: She  is alert and oriented to person, place, and time. She has normal strength. No cranial nerve deficit or sensory deficit. Coordination normal. GCS eye subscore is 4. GCS verbal subscore is 5. GCS motor subscore is 6.  Skin: Skin is warm, dry and intact. No rash noted. No cyanosis.  Psychiatric: She has a normal mood and affect. Her speech is normal and behavior is normal. Thought content normal.  Nursing note and vitals reviewed.   ED Course  Procedures (including critical care  time)  DIAGNOSTIC STUDIES: Oxygen Saturation is 97% on RA,  normal by my interpretation.    COORDINATION OF CARE: 1:11 AM Discussed treatment plan which includes lab work, CXR, and EKG with pt at bedside and pt agreed to plan.  Labs Review Labs Reviewed  BASIC METABOLIC PANEL - Abnormal; Notable for the following:    Glucose, Bld 102 (*)    All other components within normal limits  CBC - Abnormal; Notable for the following:    RBC 3.79 (*)    Hemoglobin 11.3 (*)    HCT 34.3 (*)    All other components within normal limits  I-STAT TROPOININ, ED    Imaging Review Dg Chest 2 View  11/13/2015  CLINICAL DATA:  Acute onset of right-sided chest pain and shortness of breath. Initial encounter. EXAM: CHEST  2 VIEW COMPARISON:  Chest radiograph performed 05/24/2014 FINDINGS: The lungs are well-aerated and clear. There is no evidence of focal opacification, pleural effusion or pneumothorax. The heart is normal in size; the mediastinal contour is within normal limits. No acute osseous abnormalities are seen. IMPRESSION: No acute cardiopulmonary process seen. Electronically Signed   By: Garald Balding M.D.   On: 11/13/2015 02:00   I have personally reviewed and evaluated these images and lab results as part of my medical decision-making.   EKG Interpretation   Date/Time:  Monday November 13 2015 00:50:46 EST Ventricular Rate:  79 PR Interval:  156 QRS Duration: 78 QT Interval:  378 QTC Calculation: 433 R Axis:   64 Text Interpretation:  Normal sinus rhythm Normal ECG When compared with  ECG of 07/11/2014, No significant change was found Confirmed by [facility]  MD,  DAVID ([id number]) on 11/13/2015 1:09:29 AM      MDM   Final diagnoses:  None  asthma Bronchitis pleurisy  Resents to emergency department for evaluation of productive cough. Patient reports previous history of asthma. She had mild wheezing apparent upon arrival. Oxygenation is 100% on room air. Chest x-ray does not show any  evidence of pneumonia. She is complaining of right-sided chest pain that is present when coughing or taking deep breaths. This is consistent with pleurisy or possibly chest wall irritation from the cough. She does not have any sign of PE.  PERC negative. Patient will be treated for bronchitis with asthma exacerbation.  I personally performed the services described in this documentation, which was scribed in my presence. The recorded information has been reviewed and is accurate.     Jasmine Greek, MD 11/13/15 [phone]

## 2015-11-13 NOTE — Discharge Instructions (Signed)
Asthma, Adult Asthma is a recurring condition in which the airways tighten and narrow. Asthma can make it difficult to breathe. It can cause coughing, wheezing, and shortness of breath. Asthma episodes, also called asthma attacks, range from minor to life-threatening. Asthma cannot be cured, but medicines and lifestyle changes can help control it. CAUSES Asthma is believed to be caused by inherited (genetic) and environmental factors, but its exact cause is unknown. Asthma may be triggered by allergens, lung infections, or irritants in the air. Asthma triggers are different for each person. Common triggers include:   Animal dander.  Dust mites.  Cockroaches.  Pollen from trees or grass.  Mold.  Smoke.  Air pollutants such as dust, household cleaners, hair sprays, aerosol sprays, paint fumes, strong chemicals, or strong odors.  Cold air, weather changes, and winds (which increase molds and pollens in the air).  Strong emotional expressions such as crying or laughing hard.  Stress.  Certain medicines (such as aspirin) or types of drugs (such as beta-blockers).  Sulfites in foods and drinks. Foods and drinks that may contain sulfites include dried fruit, potato chips, and sparkling grape juice.  Infections or inflammatory conditions such as the flu, a cold, or an inflammation of the nasal membranes (rhinitis).  Gastroesophageal reflux disease (GERD).  Exercise or strenuous activity. SYMPTOMS Symptoms may occur immediately after asthma is triggered or many hours later. Symptoms include:  Wheezing.  Excessive nighttime or early morning coughing.  Frequent or severe coughing with a common cold.  Chest tightness.  Shortness of breath. DIAGNOSIS  The diagnosis of asthma is made by a review of your medical history and a physical exam. Tests may also be performed. These may include:  Lung function studies. These tests show how much air you breathe in and out.  Allergy  tests.  Imaging tests such as X-rays. TREATMENT  Asthma cannot be cured, but it can usually be controlled. Treatment involves identifying and avoiding your asthma triggers. It also involves medicines. There are 2 classes of medicine used for asthma treatment:   Controller medicines. These prevent asthma symptoms from occurring. They are usually taken every day.  Reliever or rescue medicines. These quickly relieve asthma symptoms. They are used as needed and provide short-term relief. Your health care provider will help you create an asthma action plan. An asthma action plan is a written plan for managing and treating your asthma attacks. It includes a list of your asthma triggers and how they may be avoided. It also includes information on when medicines should be taken and when their dosage should be changed. An action plan may also involve the use of a device called a peak flow meter. A peak flow meter measures how well the lungs are working. It helps you monitor your condition. HOME CARE INSTRUCTIONS   Take medicines only as directed by your health care provider. Speak with your health care provider if you have questions about how or when to take the medicines.  Use a peak flow meter as directed by your health care provider. Record and keep track of readings.  Understand and use the action plan to help minimize or stop an asthma attack without needing to seek medical care.  Control your home environment in the following ways to help prevent asthma attacks:  Do not smoke. Avoid being exposed to secondhand smoke.  Change your heating and air conditioning filter regularly.  Limit your use of fireplaces and wood stoves.  Get rid of pests (such as roaches  and mice) and their droppings.  Throw away plants if you see mold on them.  Clean your floors and dust regularly. Use unscented cleaning products.  Try to have someone else vacuum for you regularly. Stay out of rooms while they are  being vacuumed and for a short while afterward. If you vacuum, use a dust mask from a hardware store, a double-layered or microfilter vacuum cleaner bag, or a vacuum cleaner with a HEPA filter.  Replace carpet with wood, tile, or vinyl flooring. Carpet can trap dander and dust.  Use allergy-proof pillows, mattress covers, and box spring covers.  Wash bed sheets and blankets every week in hot water and dry them in a dryer.  Use blankets that are made of polyester or cotton.  Clean bathrooms and kitchens with bleach. If possible, have someone repaint the walls in these rooms with mold-resistant paint. Keep out of the rooms that are being cleaned and painted.  Wash hands frequently. SEEK MEDICAL CARE IF:   You have wheezing, shortness of breath, or a cough even if taking medicine to prevent attacks.  The colored mucus you cough up (sputum) is thicker than usual.  Your sputum changes from clear or white to yellow, green, gray, or bloody.  You have any problems that may be related to the medicines you are taking (such as a rash, itching, swelling, or trouble breathing).  You are using a reliever medicine more than 2-3 times per week.  Your peak flow is still at 50-79% of your personal best after following your action plan for 1 hour.  You have a fever. SEEK IMMEDIATE MEDICAL CARE IF:   You seem to be getting worse and are unresponsive to treatment during an asthma attack.  You are short of breath even at rest.  You get short of breath when doing very little physical activity.  You have difficulty eating, drinking, or talking due to asthma symptoms.  You develop chest pain.  You develop a fast heartbeat.  You have a bluish color to your lips or fingernails.  You are light-headed, dizzy, or faint.  Your peak flow is less than 50% of your personal best.   This information is not intended to replace advice given to you by your health care provider. Make sure you discuss any  questions you have with your health care provider.   Document Released: 11/18/2005 Document Revised: 08/09/2015 Document Reviewed: 06/17/2013 Elsevier Interactive Patient Education 2016 [place] is an inflammation and swelling of the lining of the lungs (pleura). Because of this inflammation, it hurts to breathe. It can be aggravated by coughing, laughing, or deep breathing. Pleurisy is often caused by an underlying infection or disease.  HOME CARE INSTRUCTIONS  Monitor your pleurisy for any changes. The following actions may help to alleviate any discomfort you are experiencing:  Medicine may help with pain. Only take over-the-counter or prescription medicines for pain, discomfort, or fever as directed by your health care provider.  Only take antibiotic medicine as directed. Make sure to finish it even if you start to feel better. SEEK MEDICAL CARE IF:   Your pain is not controlled with medicine or is increasing.  You have an increase in pus-like (purulent) secretions brought up with coughing. SEEK IMMEDIATE MEDICAL CARE IF:   You have blue or dark lips, fingernails, or toenails.  You are coughing up blood.  You have increased difficulty breathing.  You have continuing pain unrelieved by medicine or pain lasting more than  1 week.  You have pain that radiates into your neck, arms, or jaw.  You develop increased shortness of breath or wheezing.  You develop a fever, rash, vomiting, fainting, or other serious symptoms. MAKE SURE YOU:  Understand these instructions.   Will watch your condition.   Will get help right away if you are not doing well or get worse.    This information is not intended to replace advice given to you by your health care provider. Make sure you discuss any questions you have with your health care provider.   Document Released: 11/18/2005 Document Revised: 07/21/2013 Document Reviewed: 05/02/2013 Elsevier Interactive  Patient Education [employer].

## 2016-01-07 ENCOUNTER — Emergency Department (HOSPITAL_COMMUNITY)
Admission: EM | Admit: 2016-01-07 | Discharge: 2016-01-07 | Disposition: A | Discharge status: Home / Self Care | Payer: PRIVATE HEALTH INSURANCE | Attending: Emergency Medicine | Admitting: Emergency Medicine

## 2016-01-07 ENCOUNTER — Encounter (HOSPITAL_COMMUNITY): Payer: Self-pay | Admitting: Nurse Practitioner

## 2016-01-07 DIAGNOSIS — J45909 Unspecified asthma, uncomplicated: Secondary | ICD-10-CM | POA: Insufficient documentation

## 2016-01-07 DIAGNOSIS — M5432 Sciatica, left side: Secondary | ICD-10-CM | POA: Insufficient documentation

## 2016-01-07 DIAGNOSIS — F17200 Nicotine dependence, unspecified, uncomplicated: Secondary | ICD-10-CM | POA: Insufficient documentation

## 2016-01-07 DIAGNOSIS — Z792 Long term (current) use of antibiotics: Secondary | ICD-10-CM | POA: Insufficient documentation

## 2016-01-07 DIAGNOSIS — Z7952 Long term (current) use of systemic steroids: Secondary | ICD-10-CM | POA: Insufficient documentation

## 2016-01-07 DIAGNOSIS — Z79899 Other long term (current) drug therapy: Secondary | ICD-10-CM | POA: Insufficient documentation

## 2016-01-07 MED ORDER — OXYCODONE-ACETAMINOPHEN 5-325 MG PO TABS: 1.0000 | ORAL_TABLET | ORAL | Status: DC | PRN

## 2016-01-07 MED ORDER — CYCLOBENZAPRINE HCL 10 MG PO TABS: 10.0000 mg | ORAL_TABLET | Freq: Two times a day (BID) | ORAL | Status: DC | PRN

## 2016-01-07 MED ORDER — KETOROLAC TROMETHAMINE 60 MG/2ML IM SOLN
60.0000 mg | Freq: Once | INTRAMUSCULAR | Status: AC
  Administered 2016-01-07: 60 mg via INTRAMUSCULAR
  Filled 2016-01-07: qty 2

## 2016-01-07 MED ORDER — NAPROXEN 375 MG PO TABS
375.0000 mg | ORAL_TABLET | Freq: Two times a day (BID) | ORAL | Status: DC
Start: 2016-01-07 — End: 2016-09-27

## 2016-01-07 MED ORDER — DEXAMETHASONE SODIUM PHOSPHATE 10 MG/ML IJ SOLN
10.0000 mg | Freq: Once | INTRAMUSCULAR | Status: AC
  Administered 2016-01-07: 10 mg via INTRAMUSCULAR
  Filled 2016-01-07: qty 1

## 2016-01-07 NOTE — Discharge Instructions (Signed)
SEEK IMMEDIATE MEDICAL ATTENTION IF: New numbness, tingling, weakness, or problem with the use of your arms or legs.  Severe back pain not relieved with medications.  Change in bowel or bladder control.  Increasing pain in any areas of the body (such as chest or abdominal pain).  Shortness of breath, dizziness or fainting.  Nausea (feeling sick to your stomach), vomiting, fever, or sweats.  Sciatica With Rehab The sciatic nerve runs from the back down the leg and is responsible for sensation and control of the muscles in the back (posterior) side of the thigh, lower leg, and foot. Sciatica is a condition that is characterized by inflammation of this nerve.  SYMPTOMS   Signs of nerve damage, including numbness and/or weakness along the posterior side of the lower extremity.  Pain in the back of the thigh that may also travel down the leg.  Pain that worsens when sitting for long periods of time.  Occasionally, pain in the back or buttock. CAUSES  Inflammation of the sciatic nerve is the cause of sciatica. The inflammation is due to something irritating the nerve. Common sources of irritation include:  Sitting for long periods of time.  Direct trauma to the nerve.  Arthritis of the spine.  Herniated or ruptured disk.  Slipping of the vertebrae (spondylolisthesis).  Pressure from soft tissues, such as muscles or ligament-like tissue (fascia). RISK INCREASES WITH:  Sports that place pressure or stress on the spine (football or weightlifting).  Poor strength and flexibility.  Failure to warm up properly before activity.  Family history of low back pain or disk disorders.  Previous back injury or surgery.  Poor body mechanics, especially when lifting, or poor posture. PREVENTION   Warm up and stretch properly before activity.  Maintain physical fitness:  Strength, flexibility, and endurance.  Cardiovascular fitness.  Learn and use proper technique, especially with  posture and lifting. When possible, have coach correct improper technique.  Avoid activities that place stress on the spine. PROGNOSIS If treated properly, then sciatica usually resolves within 6 weeks. However, occasionally surgery is necessary.  RELATED COMPLICATIONS   Permanent nerve damage, including pain, numbness, tingle, or weakness.  Chronic back pain.  Risks of surgery: infection, bleeding, nerve damage, or damage to surrounding tissues. TREATMENT Treatment initially involves resting from any activities that aggravate your symptoms. The use of ice and medication may help reduce pain and inflammation. The use of strengthening and stretching exercises may help reduce pain with activity. These exercises may be performed at home or with referral to a therapist. A therapist may recommend further treatments, such as transcutaneous electronic nerve stimulation (TENS) or ultrasound. Your caregiver may recommend corticosteroid injections to help reduce inflammation of the sciatic nerve. If symptoms persist despite non-surgical (conservative) treatment, then surgery may be recommended. MEDICATION  If pain medication is necessary, then nonsteroidal anti-inflammatory medications, such as aspirin and ibuprofen, or other minor pain relievers, such as acetaminophen, are often recommended.  Do not take pain medication for 7 days before surgery.  Prescription pain relievers may be given if deemed necessary by your caregiver. Use only as directed and only as much as you need.  Ointments applied to the skin may be helpful.  Corticosteroid injections may be given by your caregiver. These injections should be reserved for the most serious cases, because they may only be given a certain number of times. HEAT AND COLD  Cold treatment (icing) relieves pain and reduces inflammation. Cold treatment should be applied for 10 to  15 minutes every 2 to 3 hours for inflammation and pain and immediately after any  activity that aggravates your symptoms. Use ice packs or massage the area with a piece of ice (ice massage).  Heat treatment may be used prior to performing the stretching and strengthening activities prescribed by your caregiver, physical therapist, or athletic trainer. Use a heat pack or soak the injury in warm water. SEEK MEDICAL CARE IF:  Treatment seems to offer no benefit, or the condition worsens.  Any medications produce adverse side effects. EXERCISES  RANGE OF MOTION (ROM) AND STRETCHING EXERCISES - Sciatica Most people with sciatic will find that their symptoms worsen with either excessive bending forward (flexion) or arching at the low back (extension). The exercises which will help resolve your symptoms will focus on the opposite motion. Your physician, physical therapist or athletic trainer will help you determine which exercises will be most helpful to resolve your low back pain. Do not complete any exercises without first consulting with your clinician. Discontinue any exercises which worsen your symptoms until you speak to your clinician. If you have pain, numbness or tingling which travels down into your buttocks, leg or foot, the goal of the therapy is for these symptoms to move closer to your back and eventually resolve. Occasionally, these leg symptoms will get better, but your low back pain may worsen; this is typically an indication of progress in your rehabilitation. Be certain to be very alert to any changes in your symptoms and the activities in which you participated in the 24 hours prior to the change. Sharing this information with your clinician will allow him/her to most efficiently treat your condition. These exercises may help you when beginning to rehabilitate your injury. Your symptoms may resolve with or without further involvement from your physician, physical therapist or athletic trainer. While completing these exercises, remember:   Restoring tissue flexibility  helps normal motion to return to the joints. This allows healthier, less painful movement and activity.  An effective stretch should be held for at least 30 seconds.  A stretch should never be painful. You should only feel a gentle lengthening or release in the stretched tissue. FLEXION RANGE OF MOTION AND STRETCHING EXERCISES: STRETCH - Flexion, Single Knee to Chest   Lie on a firm bed or floor with both legs extended in front of you.  Keeping one leg in contact with the floor, bring your opposite knee to your chest. Hold your leg in place by either grabbing behind your thigh or at your knee.  Pull until you feel a gentle stretch in your low back. Hold __________ seconds.  Slowly release your grasp and repeat the exercise with the opposite side. Repeat __________ times. Complete this exercise __________ times per day.  STRETCH - Flexion, Double Knee to Chest  Lie on a firm bed or floor with both legs extended in front of you.  Keeping one leg in contact with the floor, bring your opposite knee to your chest.  Tense your stomach muscles to support your back and then lift your other knee to your chest. Hold your legs in place by either grabbing behind your thighs or at your knees.  Pull both knees toward your chest until you feel a gentle stretch in your low back. Hold __________ seconds.  Tense your stomach muscles and slowly return one leg at a time to the floor. Repeat __________ times. Complete this exercise __________ times per day.  STRETCH - Low Trunk Rotation  Lie on a firm bed or floor. Keeping your legs in front of you, bend your knees so they are both pointed toward the ceiling and your feet are flat on the floor.  Extend your arms out to the side. This will stabilize your upper body by keeping your shoulders in contact with the floor.  Gently and slowly drop both knees together to one side until you feel a gentle stretch in your low back. Hold for __________  seconds.  Tense your stomach muscles to support your low back as you bring your knees back to the starting position. Repeat the exercise to the other side. Repeat __________ times. Complete this exercise __________ times per day  EXTENSION RANGE OF MOTION AND FLEXIBILITY EXERCISES: STRETCH - Extension, Prone on Elbows  Lie on your stomach on the floor, a bed will be too soft. Place your palms about shoulder width apart and at the height of your head.  Place your elbows under your shoulders. If this is too painful, stack pillows under your chest.  Allow your body to relax so that your hips drop lower and make contact more completely with the floor.  Hold this position for __________ seconds.  Slowly return to lying flat on the floor. Repeat __________ times. Complete this exercise __________ times per day.  RANGE OF MOTION - Extension, Prone Press Ups  Lie on your stomach on the floor, a bed will be too soft. Place your palms about shoulder width apart and at the height of your head.  Keeping your back as relaxed as possible, slowly straighten your elbows while keeping your hips on the floor. You may adjust the placement of your hands to maximize your comfort. As you gain motion, your hands will come more underneath your shoulders.  Hold this position __________ seconds.  Slowly return to lying flat on the floor. Repeat __________ times. Complete this exercise __________ times per day.  STRENGTHENING EXERCISES - Sciatica  These exercises may help you when beginning to rehabilitate your injury. These exercises should be done near your "sweet spot." This is the neutral, low-back arch, somewhere between fully rounded and fully arched, that is your least painful position. When performed in this safe range of motion, these exercises can be used for people who have either a flexion or extension based injury. These exercises may resolve your symptoms with or without further involvement from your  physician, physical therapist or athletic trainer. While completing these exercises, remember:   Muscles can gain both the endurance and the strength needed for everyday activities through controlled exercises.  Complete these exercises as instructed by your physician, physical therapist or athletic trainer. Progress with the resistance and repetition exercises only as your caregiver advises.  You may experience muscle soreness or fatigue, but the pain or discomfort you are trying to eliminate should never worsen during these exercises. If this pain does worsen, stop and make certain you are following the directions exactly. If the pain is still present after adjustments, discontinue the exercise until you can discuss the trouble with your clinician. STRENGTHENING - Deep Abdominals, Pelvic Tilt   Lie on a firm bed or floor. Keeping your legs in front of you, bend your knees so they are both pointed toward the ceiling and your feet are flat on the floor.  Tense your lower abdominal muscles to press your low back into the floor. This motion will rotate your pelvis so that your tail bone is scooping upwards rather than pointing at your  feet or into the floor.  With a gentle tension and even breathing, hold this position for __________ seconds. Repeat __________ times. Complete this exercise __________ times per day.  STRENGTHENING - Abdominals, Crunches   Lie on a firm bed or floor. Keeping your legs in front of you, bend your knees so they are both pointed toward the ceiling and your feet are flat on the floor. Cross your arms over your chest.  Slightly tip your chin down without bending your neck.  Tense your abdominals and slowly lift your trunk high enough to just clear your shoulder blades. Lifting higher can put excessive stress on the low back and does not further strengthen your abdominal muscles.  Control your return to the starting position. Repeat __________ times. Complete this  exercise __________ times per day.  STRENGTHENING - Quadruped, Opposite UE/LE Lift  Assume a hands and knees position on a firm surface. Keep your hands under your shoulders and your knees under your hips. You may place padding under your knees for comfort.  Find your neutral spine and gently tense your abdominal muscles so that you can maintain this position. Your shoulders and hips should form a rectangle that is parallel with the floor and is not twisted.  Keeping your trunk steady, lift your right hand no higher than your shoulder and then your left leg no higher than your hip. Make sure you are not holding your breath. Hold this position __________ seconds.  Continuing to keep your abdominal muscles tense and your back steady, slowly return to your starting position. Repeat with the opposite arm and leg. Repeat __________ times. Complete this exercise __________ times per day.  STRENGTHENING - Abdominals and Quadriceps, Straight Leg Raise   Lie on a firm bed or floor with both legs extended in front of you.  Keeping one leg in contact with the floor, bend the other knee so that your foot can rest flat on the floor.  Find your neutral spine, and tense your abdominal muscles to maintain your spinal position throughout the exercise.  Slowly lift your straight leg off the floor about 6 inches for a count of 15, making sure to not hold your breath.  Still keeping your neutral spine, slowly lower your leg all the way to the floor. Repeat this exercise with each leg __________ times. Complete this exercise __________ times per day. POSTURE AND BODY MECHANICS CONSIDERATIONS - Sciatica Keeping correct posture when sitting, standing or completing your activities will reduce the stress put on different body tissues, allowing injured tissues a chance to heal and limiting painful experiences. The following are general guidelines for improved posture. Your physician or physical therapist will provide  you with any instructions specific to your needs. While reading these guidelines, remember:  The exercises prescribed by your provider will help you have the flexibility and strength to maintain correct postures.  The correct posture provides the optimal environment for your joints to work. All of your joints have less wear and tear when properly supported by a spine with good posture. This means you will experience a healthier, less painful body.  Correct posture must be practiced with all of your activities, especially prolonged sitting and standing. Correct posture is as important when doing repetitive low-stress activities (typing) as it is when doing a single heavy-load activity (lifting). RESTING POSITIONS Consider which positions are most painful for you when choosing a resting position. If you have pain with flexion-based activities (sitting, bending, stooping, squatting), choose a position  that allows you to rest in a less flexed posture. You would want to avoid curling into a fetal position on your side. If your pain worsens with extension-based activities (prolonged standing, working overhead), avoid resting in an extended position such as sleeping on your stomach. Most people will find more comfort when they rest with their spine in a more neutral position, neither too rounded nor too arched. Lying on a non-sagging bed on your side with a pillow between your knees, or on your back with a pillow under your knees will often provide some relief. Keep in mind, being in any one position for a prolonged period of time, no matter how correct your posture, can still lead to stiffness. PROPER SITTING POSTURE In order to minimize stress and discomfort on your spine, you must sit with correct posture Sitting with good posture should be effortless for a healthy body. Returning to good posture is a gradual process. Many people can work toward this most comfortably by using various supports until they have  the flexibility and strength to maintain this posture on their own. When sitting with proper posture, your ears will fall over your shoulders and your shoulders will fall over your hips. You should use the back of the chair to support your upper back. Your low back will be in a neutral position, just slightly arched. You may place a small pillow or folded towel at the base of your low back for support.  When working at a desk, create an environment that supports good, upright posture. Without extra support, muscles fatigue and lead to excessive strain on joints and other tissues. Keep these recommendations in mind: CHAIR:   A chair should be able to slide under your desk when your back makes contact with the back of the chair. This allows you to work closely.  The chair's height should allow your eyes to be level with the upper part of your monitor and your hands to be slightly lower than your elbows. BODY POSITION  Your feet should make contact with the floor. If this is not possible, use a foot rest.  Keep your ears over your shoulders. This will reduce stress on your neck and low back. INCORRECT SITTING POSTURES   If you are feeling tired and unable to assume a healthy sitting posture, do not slouch or slump. This puts excessive strain on your back tissues, causing more damage and pain. Healthier options include:  Using more support, like a lumbar pillow.  Switching tasks to something that requires you to be upright or walking.  Talking a brief walk.  Lying down to rest in a neutral-spine position. PROLONGED STANDING WHILE SLIGHTLY LEANING FORWARD  When completing a task that requires you to lean forward while standing in one place for a long time, place either foot up on a stationary 2-4 inch high object to help maintain the best posture. When both feet are on the ground, the low back tends to lose its slight inward curve. If this curve flattens (or becomes too large), then the back and  your other joints will experience too much stress, fatigue more quickly and can cause pain.  CORRECT STANDING POSTURES Proper standing posture should be assumed with all daily activities, even if they only take a few moments, like when brushing your teeth. As in sitting, your ears should fall over your shoulders and your shoulders should fall over your hips. You should keep a slight tension in your abdominal muscles to brace your  spine. Your tailbone should point down to the ground, not behind your body, resulting in an over-extended swayback posture.  INCORRECT STANDING POSTURES  Common incorrect standing postures include a forward head, locked knees and/or an excessive swayback. WALKING Walk with an upright posture. Your ears, shoulders and hips should all line-up. PROLONGED ACTIVITY IN A FLEXED POSITION When completing a task that requires you to bend forward at your waist or lean over a low surface, try to find a way to stabilize 3 of 4 of your limbs. You can place a hand or elbow on your thigh or rest a knee on the surface you are reaching across. This will provide you more stability so that your muscles do not fatigue as quickly. By keeping your knees relaxed, or slightly bent, you will also reduce stress across your low back. CORRECT LIFTING TECHNIQUES DO :   Assume a wide stance. This will provide you more stability and the opportunity to get as close as possible to the object which you are lifting.  Tense your abdominals to brace your spine; then bend at the knees and hips. Keeping your back locked in a neutral-spine position, lift using your leg muscles. Lift with your legs, keeping your back straight.  Test the weight of unknown objects before attempting to lift them.  Try to keep your elbows locked down at your sides in order get the best strength from your shoulders when carrying an object.  Always ask for help when lifting heavy or awkward objects. INCORRECT LIFTING TECHNIQUES DO  NOT:   Lock your knees when lifting, even if it is a small object.  Bend and twist. Pivot at your feet or move your feet when needing to change directions.  Assume that you cannot safely pick up a paperclip without proper posture.   This information is not intended to replace advice given to you by your health care provider. Make sure you discuss any questions you have with your health care provider.   Document Released: 11/18/2005 Document Revised: 04/04/2015 Document Reviewed: 03/02/2009 [employer] Interactive Patient Education [employer].

## 2016-01-07 NOTE — ED Notes (Signed)
PA at bedside.

## 2016-01-07 NOTE — ED Provider Notes (Signed)
CSN: [id number]     Arrival date & time 01/07/16  78 History   First MD Initiated Contact with Patient 01/07/16 1434     Chief Complaint  Patient presents with  . Generalized Body Aches     (Consider location/radiation/quality/duration/timing/severity/associated sxs/prior Treatment) HPI  This 45 year old female who is a chronic daily smoker with morbid obesity and asthma who presents emergency Department with chief complaint of pain. She complains of generalized body pain, but specifically is having pain on her left side, which starts in her lower back and radiates in the sciatic nerve distribution. She states that at times she is sharp, electrical, pain radiating down that side, which causes her to nearly lose balance and she has to stop and hold on to something. Occasionally it wakes her from sleep." She had something similar to this several years ago and was diagnosed with a slipped disc. She has tried ice, warm Epsom salt soaks, stretching, over-the-counter pain medicines. The pain is worse with flexion of the hip or straight leg. She denies fever, IV drug use, loss of bowel or bladder continence, saddle anesthesia.  Past Medical History  Diagnosis Date  . Asthma    Past Surgical History  Procedure Laterality Date  . Foot surgery      I+D   Family History  Problem Relation Age of Onset  . Asthma Mother    Social History  Substance Use Topics  . Smoking status: Current Every Day Smoker  . Smokeless tobacco: None  . Alcohol Use: Yes     Comment: occ beer and liquor   OB History    No data available     Review of Systems  Ten systems reviewed and are negative for acute change, except as noted in the HPI.    Allergies  Review of patient's allergies indicates no known allergies.  Home Medications   Prior to Admission medications   Medication Sig Start Date End Date Taking? Authorizing Provider  albuterol (PROVENTIL HFA;VENTOLIN HFA) 108 (90 BASE) MCG/ACT inhaler  Inhale 1-2 puffs into the lungs every 6 (six) hours as needed for wheezing or shortness of breath. 11/17/12   Modena Jansky, MD  amoxicillin (AMOXIL) 500 MG capsule Take 1 capsule (500 mg total) by mouth 3 (three) times daily. 05/26/15   Domenic Moras, PA-C  azithromycin (ZITHROMAX) 250 MG tablet Take 1 tablet (250 mg total) by mouth daily. Take first 2 tablets together, then 1 every day until finished. 11/13/15   Orpah Greek, MD  chlorhexidine (PERIDEX) 0.12 % solution Use as directed 15 mLs in the mouth or throat 2 (two) times daily. 05/26/15   Domenic Moras, PA-C  cyclobenzaprine (FLEXERIL) 10 MG tablet Take 1 tablet (10 mg total) by mouth 2 (two) times daily as needed for muscle spasms. 01/07/16   Margarita Mail, PA-C  naproxen (NAPROSYN) 375 MG tablet Take 1 tablet (375 mg total) by mouth 2 (two) times daily. 01/07/16   Margarita Mail, PA-C  oxyCODONE-acetaminophen (PERCOCET) 5-325 MG tablet Take 1-2 tablets by mouth every 4 (four) hours as needed. 01/07/16   Margarita Mail, PA-C  predniSONE (DELTASONE) 20 MG tablet Take 3 tablets (60 mg total) by mouth daily with breakfast. 11/13/15   Orpah Greek, MD   BP 122/65 mmHg  Pulse 82  Temp(Src) 98.6 F (37 C) (Oral)  Resp 22  LMP 12/31/2015 Physical Exam  Constitutional: She is oriented to person, place, and time. She appears well-developed and well-nourished. No distress.  HENT:  Head:  Normocephalic and atraumatic.  Eyes: Conjunctivae are normal. No scleral icterus.  Neck: Normal range of motion.  Cardiovascular: Normal rate, regular rhythm and normal heart sounds.  Exam reveals no gallop and no friction rub.   No murmur heard. Pulmonary/Chest: Effort normal and breath sounds normal. No respiratory distress.  Abdominal: Soft. Bowel sounds are normal. She exhibits no distension and no mass. There is no tenderness. There is no guarding.  Musculoskeletal:  Patient with mild bilateral lumbar pain and pain over the sacrum. Her pain is  not reproducible. She is a positive straight leg exam. Normal DTRs, no weakness.  Neurological: She is alert and oriented to person, place, and time.  Skin: Skin is warm and dry. She is not diaphoretic.  Nursing note and vitals reviewed.   ED Course  Procedures (including critical care time) Labs Review Labs Reviewed - No data to display  Imaging Review No results found. I have personally reviewed and evaluated these images and lab results as part of my medical decision-making.   EKG Interpretation None      MDM   Final diagnoses:  Sciatica of left side    Patient with back pain.  No neurological deficits and normal neuro exam.  \EXAM CONSISTENTE WITH DX OF SCIATICA.Patient can walk but states is painful.  No loss of bowel or bladder control.  No concern for cauda equina.  No fever, night sweats, weight loss, h/o cancer, IVDU.  RICE protocol and pain medicine indicated and discussed with patient.    Medications  dexamethasone (DECADRON) injection 10 mg (10 mg Intramuscular Given 01/07/16 1513)  ketorolac (TORADOL) injection 60 mg (60 mg Intramuscular Given 01/07/16 1513)     Margarita Mail, PA-C 01/07/16 1555  Leonard Schwartz, MD 01/08/16 2306

## 2016-01-07 NOTE — ED Notes (Addendum)
She states she is having intermittent sharp burning pains in her arms, buttock, legs over past week. Pain worse with movement. Describes as "charlie horses." she tried aspirin, tylenol with no relief. She denies fevers, n/v, bowel/bladder changes, weakness. She is ambulatory, mae. She states she had this pain before and it was relieved with a shot in her back

## 2016-01-26 ENCOUNTER — Encounter (HOSPITAL_COMMUNITY): Payer: Self-pay | Admitting: Emergency Medicine

## 2016-01-26 ENCOUNTER — Emergency Department (HOSPITAL_COMMUNITY)
Admission: EM | Admit: 2016-01-26 | Discharge: 2016-01-26 | Disposition: A | Discharge status: Home / Self Care | Payer: PRIVATE HEALTH INSURANCE | Attending: Emergency Medicine | Admitting: Emergency Medicine

## 2016-01-26 ENCOUNTER — Emergency Department (HOSPITAL_COMMUNITY): Payer: PRIVATE HEALTH INSURANCE

## 2016-01-26 DIAGNOSIS — J45901 Unspecified asthma with (acute) exacerbation: Secondary | ICD-10-CM

## 2016-01-26 DIAGNOSIS — F17200 Nicotine dependence, unspecified, uncomplicated: Secondary | ICD-10-CM | POA: Insufficient documentation

## 2016-01-26 DIAGNOSIS — R000 Tachycardia, unspecified: Secondary | ICD-10-CM | POA: Insufficient documentation

## 2016-01-26 DIAGNOSIS — Z79899 Other long term (current) drug therapy: Secondary | ICD-10-CM | POA: Insufficient documentation

## 2016-01-26 DIAGNOSIS — Z791 Long term (current) use of non-steroidal anti-inflammatories (NSAID): Secondary | ICD-10-CM | POA: Insufficient documentation

## 2016-01-26 DIAGNOSIS — Z7952 Long term (current) use of systemic steroids: Secondary | ICD-10-CM | POA: Insufficient documentation

## 2016-01-26 DIAGNOSIS — Z792 Long term (current) use of antibiotics: Secondary | ICD-10-CM | POA: Insufficient documentation

## 2016-01-26 DIAGNOSIS — J069 Acute upper respiratory infection, unspecified: Secondary | ICD-10-CM

## 2016-01-26 LAB — RAPID STREP SCREEN (MED CTR MEBANE ONLY): STREPTOCOCCUS, GROUP A SCREEN (DIRECT): NEGATIVE

## 2016-01-26 MED ORDER — IPRATROPIUM-ALBUTEROL 0.5-2.5 (3) MG/3ML IN SOLN
3.0000 mL | Freq: Once | RESPIRATORY_TRACT | Status: AC
  Administered 2016-01-26: 3 mL via RESPIRATORY_TRACT
  Filled 2016-01-26: qty 3

## 2016-01-26 MED ORDER — BENZONATATE 100 MG PO CAPS: 100.0000 mg | ORAL_CAPSULE | Freq: Three times a day (TID) | ORAL | Status: DC | PRN

## 2016-01-26 MED ORDER — DEXAMETHASONE SODIUM PHOSPHATE 10 MG/ML IJ SOLN
10.0000 mg | Freq: Once | INTRAMUSCULAR | Status: AC
  Administered 2016-01-26: 10 mg via INTRAMUSCULAR
  Filled 2016-01-26: qty 1

## 2016-01-26 MED ORDER — ALBUTEROL SULFATE (2.5 MG/3ML) 0.083% IN NEBU: 5.0000 mg | INHALATION_SOLUTION | Freq: Once | RESPIRATORY_TRACT | Status: DC

## 2016-01-26 MED ORDER — ALBUTEROL SULFATE HFA 108 (90 BASE) MCG/ACT IN AERS
2.0000 | INHALATION_SPRAY | Freq: Once | RESPIRATORY_TRACT | Status: AC
  Administered 2016-01-26: 2 via RESPIRATORY_TRACT
  Filled 2016-01-26: qty 6.7

## 2016-01-26 MED ORDER — PREDNISONE 10 MG (21) PO TBPK: 10.0000 mg | ORAL_TABLET | Freq: Every day | ORAL | Status: DC

## 2016-01-26 NOTE — ED Notes (Signed)
Pt states "my throat hurts when i swallow, it feels swollen and its making me feel like my windpipe is going to close". Pt hx asthma. Pt states her asthma is getting worse, feels SOB. Pt state shes out of her inhaler, she ran out, requesting breathing treatment.

## 2016-01-26 NOTE — Discharge Instructions (Signed)
Read the information below.  Use the prescribed medication as directed.  Please discuss all new medications with your pharmacist.  You may return to the Emergency Department at any time for worsening condition or any new symptoms that concern you.    If you develop high fevers that do not resolve with tylenol or ibuprofen, you have difficulty swallowing or breathing, or you are unable to tolerate fluids by mouth, return to the ER for a recheck.     If you develop worsening shortness of breath, uncontrolled wheezing, severe chest pain, or fevers despite using tylenol and/or ibuprofen, return for a recheck.       Asthma, Adult Asthma is a recurring condition in which the airways tighten and narrow. Asthma can make it difficult to breathe. It can cause coughing, wheezing, and shortness of breath. Asthma episodes, also called asthma attacks, range from minor to life-threatening. Asthma cannot be cured, but medicines and lifestyle changes can help control it. CAUSES Asthma is believed to be caused by inherited (genetic) and environmental factors, but its exact cause is unknown. Asthma may be triggered by allergens, lung infections, or irritants in the air. Asthma triggers are different for each person. Common triggers include:   Animal dander.  Dust mites.  Cockroaches.  Pollen from trees or grass.  Mold.  Smoke.  Air pollutants such as dust, household cleaners, hair sprays, aerosol sprays, paint fumes, strong chemicals, or strong odors.  Cold air, weather changes, and winds (which increase molds and pollens in the air).  Strong emotional expressions such as crying or laughing hard.  Stress.  Certain medicines (such as aspirin) or types of drugs (such as beta-blockers).  Sulfites in foods and drinks. Foods and drinks that may contain sulfites include dried fruit, potato chips, and sparkling grape juice.  Infections or inflammatory conditions such as the flu, a cold, or an inflammation of  the nasal membranes (rhinitis).  Gastroesophageal reflux disease (GERD).  Exercise or strenuous activity. SYMPTOMS Symptoms may occur immediately after asthma is triggered or many hours later. Symptoms include:  Wheezing.  Excessive nighttime or early morning coughing.  Frequent or severe coughing with a common cold.  Chest tightness.  Shortness of breath. DIAGNOSIS  The diagnosis of asthma is made by a review of your medical history and a physical exam. Tests may also be performed. These may include:  Lung function studies. These tests show how much air you breathe in and out.  Allergy tests.  Imaging tests such as X-rays. TREATMENT  Asthma cannot be cured, but it can usually be controlled. Treatment involves identifying and avoiding your asthma triggers. It also involves medicines. There are 2 classes of medicine used for asthma treatment:   Controller medicines. These prevent asthma symptoms from occurring. They are usually taken every day.  Reliever or rescue medicines. These quickly relieve asthma symptoms. They are used as needed and provide short-term relief. Your health care provider will help you create an asthma action plan. An asthma action plan is a written plan for managing and treating your asthma attacks. It includes a list of your asthma triggers and how they may be avoided. It also includes information on when medicines should be taken and when their dosage should be changed. An action plan may also involve the use of a device called a peak flow meter. A peak flow meter measures how well the lungs are working. It helps you monitor your condition. HOME CARE INSTRUCTIONS   Take medicines only as directed by  your health care provider. Speak with your health care provider if you have questions about how or when to take the medicines.  Use a peak flow meter as directed by your health care provider. Record and keep track of readings.  Understand and use the action  plan to help minimize or stop an asthma attack without needing to seek medical care.  Control your home environment in the following ways to help prevent asthma attacks:  Do not smoke. Avoid being exposed to secondhand smoke.  Change your heating and air conditioning filter regularly.  Limit your use of fireplaces and wood stoves.  Get rid of pests (such as roaches and mice) and their droppings.  Throw away plants if you see mold on them.  Clean your floors and dust regularly. Use unscented cleaning products.  Try to have someone else vacuum for you regularly. Stay out of rooms while they are being vacuumed and for a short while afterward. If you vacuum, use a dust mask from a hardware store, a double-layered or microfilter vacuum cleaner bag, or a vacuum cleaner with a HEPA filter.  Replace carpet with wood, tile, or vinyl flooring. Carpet can trap dander and dust.  Use allergy-proof pillows, mattress covers, and box spring covers.  Wash bed sheets and blankets every week in hot water and dry them in a dryer.  Use blankets that are made of polyester or cotton.  Clean bathrooms and kitchens with bleach. If possible, have someone repaint the walls in these rooms with mold-resistant paint. Keep out of the rooms that are being cleaned and painted.  Wash hands frequently. SEEK MEDICAL CARE IF:   You have wheezing, shortness of breath, or a cough even if taking medicine to prevent attacks.  The colored mucus you cough up (sputum) is thicker than usual.  Your sputum changes from clear or white to yellow, green, gray, or bloody.  You have any problems that may be related to the medicines you are taking (such as a rash, itching, swelling, or trouble breathing).  You are using a reliever medicine more than 2-3 times per week.  Your peak flow is still at 50-79% of your personal best after following your action plan for 1 hour.  You have a fever. SEEK IMMEDIATE MEDICAL CARE IF:    You seem to be getting worse and are unresponsive to treatment during an asthma attack.  You are short of breath even at rest.  You get short of breath when doing very little physical activity.  You have difficulty eating, drinking, or talking due to asthma symptoms.  You develop chest pain.  You develop a fast heartbeat.  You have a bluish color to your lips or fingernails.  You are light-headed, dizzy, or faint.  Your peak flow is less than 50% of your personal best.   This information is not intended to replace advice given to you by your health care provider. Make sure you discuss any questions you have with your health care provider.   Document Released: 11/18/2005 Document Revised: 08/09/2015 Document Reviewed: 06/17/2013 Elsevier Interactive Patient Education 2016 [employer]  Upper Respiratory Infection, Adult Most upper respiratory infections (URIs) are caused by a virus. A URI affects the nose, throat, and upper air passages. The most common type of URI is often called "the common cold." HOME CARE   Take medicines only as told by your doctor.  Gargle warm saltwater or take cough drops to comfort your throat as told by your doctor.  Use a warm mist humidifier or inhale steam from a shower to increase air moisture. This may make it easier to breathe.  Drink enough fluid to keep your pee (urine) clear or pale yellow.  Eat soups and other clear broths.  Have a healthy diet.  Rest as needed.  Go back to work when your fever is gone or your doctor says it is okay.  You may need to stay home longer to avoid giving your URI to others.  You can also wear a face mask and wash your hands often to prevent spread of the virus.  Use your inhaler more if you have asthma.  Do not use any tobacco products, including cigarettes, chewing tobacco, or electronic cigarettes. If you need help quitting, ask your doctor. GET HELP IF:  You are getting worse, not  better.  Your symptoms are not helped by medicine.  You have chills.  You are getting more short of breath.  You have brown or red mucus.  You have yellow or brown discharge from your nose.  You have pain in your face, especially when you bend forward.  You have a fever.  You have puffy (swollen) neck glands.  You have pain while swallowing.  You have white areas in the back of your throat. GET HELP RIGHT AWAY IF:   You have very bad or constant:  Headache.  Ear pain.  Pain in your forehead, behind your eyes, and over your cheekbones (sinus pain).  Chest pain.  You have long-lasting (chronic) lung disease and any of the following:  Wheezing.  Long-lasting cough.  Coughing up blood.  A change in your usual mucus.  You have a stiff neck.  You have changes in your:  Vision.  Hearing.  Thinking.  Mood. MAKE SURE YOU:   Understand these instructions.  Will watch your condition.  Will get help right away if you are not doing well or get worse.   This information is not intended to replace advice given to you by your health care provider. Make sure you discuss any questions you have with your health care provider.   Document Released: 05/06/2008 Document Revised: 04/04/2015 Document Reviewed: 02/23/2014 Elsevier Interactive Patient Education [employer].

## 2016-01-26 NOTE — ED Provider Notes (Signed)
CSN: [id number]     Arrival date & time 01/26/16  1556 History  By signing my name below, I, Irene Pap, attest that this documentation has been prepared under the direction and in the presence of Craige Patel, PA-C. Electronically Signed: Irene Pap, ED Scribe. 01/26/2016. 6:50 PM.  Chief Complaint  Patient presents with  . Asthma  . Sore Throat   The history is provided by the patient. No language interpreter was used.  HPI Comments: Jasmine Heath is a 45 y.o. female with a hx of asthma who presents to the Emergency Department complaining of sore throat, chest tightness and SOB onset one week ago. Per triage note, pt reports worsening pain with swallowing and "it feels like my windpipe is going to close." She reports associated cough with sputum that turned red today and nasal congestion. She has not tried anything for her symptoms, stating that she ran out of her inhaler. She denies fever, chills, abdominal pain, nausea, vomiting, diarrhea, or rash.   Past Medical History  Diagnosis Date  . Asthma    Past Surgical History  Procedure Laterality Date  . Foot surgery      I+D   Family History  Problem Relation Age of Onset  . Asthma Mother    Social History  Substance Use Topics  . Smoking status: Current Every Day Smoker  . Smokeless tobacco: None  . Alcohol Use: Yes     Comment: occ beer and liquor   OB History    No data available     Review of Systems  Constitutional: Negative for fever and chills.  HENT: Positive for congestion and sore throat. Negative for trouble swallowing.   Respiratory: Positive for cough, chest tightness, shortness of breath and wheezing.   Gastrointestinal: Negative for nausea, vomiting, abdominal pain and diarrhea.  Skin: Negative for rash.  Allergic/Immunologic: Negative for immunocompromised state.   Allergies  Review of patient's allergies indicates no known allergies.  Home Medications   Prior to Admission medications    Medication Sig Start Date End Date Taking? Authorizing Provider  albuterol (PROVENTIL HFA;VENTOLIN HFA) 108 (90 BASE) MCG/ACT inhaler Inhale 1-2 puffs into the lungs every 6 (six) hours as needed for wheezing or shortness of breath. 11/17/12   Modena Jansky, MD  amoxicillin (AMOXIL) 500 MG capsule Take 1 capsule (500 mg total) by mouth 3 (three) times daily. 05/26/15   Domenic Moras, PA-C  azithromycin (ZITHROMAX) 250 MG tablet Take 1 tablet (250 mg total) by mouth daily. Take first 2 tablets together, then 1 every day until finished. 11/13/15   Orpah Greek, MD  chlorhexidine (PERIDEX) 0.12 % solution Use as directed 15 mLs in the mouth or throat 2 (two) times daily. 05/26/15   Domenic Moras, PA-C  cyclobenzaprine (FLEXERIL) 10 MG tablet Take 1 tablet (10 mg total) by mouth 2 (two) times daily as needed for muscle spasms. 01/07/16   Margarita Mail, PA-C  naproxen (NAPROSYN) 375 MG tablet Take 1 tablet (375 mg total) by mouth 2 (two) times daily. 01/07/16   Margarita Mail, PA-C  oxyCODONE-acetaminophen (PERCOCET) 5-325 MG tablet Take 1-2 tablets by mouth every 4 (four) hours as needed. 01/07/16   Margarita Mail, PA-C  predniSONE (DELTASONE) 20 MG tablet Take 3 tablets (60 mg total) by mouth daily with breakfast. 11/13/15   Orpah Greek, MD   BP 105/65 mmHg  Pulse 105  Temp(Src) 98.2 F (36.8 C) (Oral)  Resp 22  SpO2 100%  LMP 12/31/2015 Physical Exam  Constitutional: She appears well-developed and well-nourished. No distress.  HENT:  Head: Normocephalic and atraumatic.  Mouth/Throat: Oropharynx is clear and moist. No oropharyngeal exudate.  Erythema to the throat, no edema or exudate  Eyes: Conjunctivae are normal.  Neck: Neck supple.  Cardiovascular: Regular rhythm.  Tachycardia present.   Pulmonary/Chest: Effort normal. No respiratory distress. She has decreased breath sounds. She has wheezes. She has no rales.  Neurological: She is alert. She exhibits normal muscle tone.   Skin: She is not diaphoretic.  Psychiatric: She has a normal mood and affect. Her behavior is normal.  Nursing note and vitals reviewed.   ED Course  Procedures (including critical care time) DIAGNOSTIC STUDIES: Oxygen Saturation is 100% on RA, normal by my interpretation.    COORDINATION OF CARE: 4:43 PM-Discussed treatment plan which includes breathing treatment with pt at bedside and pt agreed to plan.    Labs Review Labs Reviewed  RAPID STREP SCREEN (NOT AT [facility])  CULTURE, GROUP A STREP [facility])    Imaging Review Dg Chest 2 View  01/26/2016  CLINICAL DATA:  45 year old with 1 week history of chest tightness and shortness of breath. Current smoker. EXAM: CHEST  2 VIEW COMPARISON:  11/13/2015 and earlier. FINDINGS: Cardiomediastinal silhouette unremarkable, unchanged. Lungs clear. Bronchovascular markings normal. Pulmonary vascularity normal. No visible pleural effusions. No pneumothorax. Degenerative changes involving the thoracic spine. No significant interval change dating back to 2013. IMPRESSION: No acute cardiopulmonary disease.  Stable examination. Electronically Signed   By: Evangeline Dakin M.D.   On: 01/26/2016 18:01   I have personally reviewed and evaluated these images and lab results as part of my medical decision-making.   EKG Interpretation None      MDM   Final diagnoses:  URI (upper respiratory infection)  Asthma exacerbation    Afebrile, nontoxic patient with constellation of symptoms suggestive of viral syndrome with astham exacerbation.  Great improvement with neb treatments. Lungs CTAB after neb treatments.  Discharged home with supportive care, prednisone, albuterol, PCP follow up.  Discussed result, findings, treatment, and follow up  with patient.  Pt given return precautions.  Pt verbalizes understanding and agrees with plan.       I personally performed the services described in this documentation, which was scribed in my presence. The recorded  information has been reviewed and is accurate.     Clayton Bibles, PA-C 01/26/16 [place], MD 01/28/16 [phone]

## 2016-01-29 LAB — CULTURE, GROUP A STREP (THRC)

## 2016-03-05 ENCOUNTER — Ambulatory Visit (INDEPENDENT_AMBULATORY_CARE_PROVIDER_SITE_OTHER)
Admission: EM | Admit: 2016-03-05 | Discharge: 2016-03-05 | Disposition: A | Discharge status: Home / Self Care | Payer: BLUE CROSS/BLUE SHIELD | Source: Home / Self Care | Attending: Family Medicine | Admitting: Family Medicine

## 2016-03-05 ENCOUNTER — Encounter (HOSPITAL_COMMUNITY): Payer: Self-pay | Admitting: *Deleted

## 2016-03-05 DIAGNOSIS — M5431 Sciatica, right side: Secondary | ICD-10-CM

## 2016-03-05 DIAGNOSIS — M5432 Sciatica, left side: Secondary | ICD-10-CM

## 2016-03-05 MED ORDER — KETOROLAC TROMETHAMINE 60 MG/2ML IM SOLN
INTRAMUSCULAR | Status: AC
  Filled 2016-03-05: qty 2

## 2016-03-05 MED ORDER — DICLOFENAC POTASSIUM 50 MG PO TABS: 50.0000 mg | ORAL_TABLET | Freq: Three times a day (TID) | ORAL | Status: DC

## 2016-03-05 MED ORDER — METHYLPREDNISOLONE ACETATE 80 MG/ML IJ SUSP
80.0000 mg | Freq: Once | INTRAMUSCULAR | Status: AC
  Administered 2016-03-05: 80 mg via INTRAMUSCULAR

## 2016-03-05 MED ORDER — CYCLOBENZAPRINE HCL 5 MG PO TABS: 5.0000 mg | ORAL_TABLET | Freq: Three times a day (TID) | ORAL | Status: DC | PRN

## 2016-03-05 MED ORDER — KETOROLAC TROMETHAMINE 30 MG/ML IJ SOLN
30.0000 mg | Freq: Once | INTRAMUSCULAR | Status: AC
  Administered 2016-03-05: 30 mg via INTRAMUSCULAR

## 2016-03-05 MED ORDER — METHYLPREDNISOLONE ACETATE 80 MG/ML IJ SUSP
INTRAMUSCULAR | Status: AC
  Filled 2016-03-05: qty 1

## 2016-03-05 NOTE — Discharge Instructions (Signed)
Use medicine as prescribed. See orthopedist for further evaluation

## 2016-03-05 NOTE — ED Provider Notes (Signed)
CSN: [id number]     Arrival date & time 03/05/16  1833 History   First MD Initiated Contact with Patient 03/05/16 2003     Chief Complaint  Patient presents with  . Leg Pain   (Consider location/radiation/quality/duration/timing/severity/associated sxs/prior Treatment) Patient is a 45 y.o. female presenting with leg pain. The history is provided by the patient.  Leg Pain Location:  Leg Injury: no   Leg location:  L upper leg, L lower leg, R upper leg and R lower leg Pain details:    Quality:  Burning and shooting   Severity:  Mild   Onset quality:  Gradual   Progression:  Worsening Chronicity:  Recurrent Dislocation: no   Ineffective treatments:  NSAIDs and muscle relaxant Associated symptoms: back pain, decreased ROM, numbness and tingling   Associated symptoms: no muscle weakness, no stiffness and no swelling     Past Medical History  Diagnosis Date  . Asthma    Past Surgical History  Procedure Laterality Date  . Foot surgery      I+D   Family History  Problem Relation Age of Onset  . Asthma Mother    Social History  Substance Use Topics  . Smoking status: Current Every Day Smoker  . Smokeless tobacco: None  . Alcohol Use: Yes     Comment: occ beer and liquor   OB History    No data available     Review of Systems  Musculoskeletal: Positive for back pain. Negative for gait problem and stiffness.  Skin: Negative.   All other systems reviewed and are negative.   Allergies  Review of patient's allergies indicates no known allergies.  Home Medications   Prior to Admission medications   Medication Sig Start Date End Date Taking? Authorizing Provider  albuterol (PROVENTIL HFA;VENTOLIN HFA) 108 (90 BASE) MCG/ACT inhaler Inhale 1-2 puffs into the lungs every 6 (six) hours as needed for wheezing or shortness of breath. 11/17/12   Modena Jansky, MD  amoxicillin (AMOXIL) 500 MG capsule Take 1 capsule (500 mg total) by mouth 3 (three) times daily. 05/26/15    Domenic Moras, PA-C  azithromycin (ZITHROMAX) 250 MG tablet Take 1 tablet (250 mg total) by mouth daily. Take first 2 tablets together, then 1 every day until finished. 11/13/15   Orpah Greek, MD  benzonatate (TESSALON) 100 MG capsule Take 1 capsule (100 mg total) by mouth 3 (three) times daily as needed for cough. 01/26/16   Clayton Bibles, PA-C  chlorhexidine (PERIDEX) 0.12 % solution Use as directed 15 mLs in the mouth or throat 2 (two) times daily. 05/26/15   Domenic Moras, PA-C  cyclobenzaprine (FLEXERIL) 5 MG tablet Take 1 tablet (5 mg total) by mouth 3 (three) times daily as needed for muscle spasms. 03/05/16   Billy Fischer, MD  diclofenac (CATAFLAM) 50 MG tablet Take 1 tablet (50 mg total) by mouth 3 (three) times daily. Prn sciatica 03/05/16   Billy Fischer, MD  naproxen (NAPROSYN) 375 MG tablet Take 1 tablet (375 mg total) by mouth 2 (two) times daily. 01/07/16   Margarita Mail, PA-C  oxyCODONE-acetaminophen (PERCOCET) 5-325 MG tablet Take 1-2 tablets by mouth every 4 (four) hours as needed. 01/07/16   Margarita Mail, PA-C  predniSONE (STERAPRED UNI-PAK 21 TAB) 10 MG (21) TBPK tablet Take 1 tablet (10 mg total) by mouth daily. Day 1: take 6 tabs.  Day 2: 5 tabs  Day 3: 4 tabs  Day 4: 3 tabs  Day 5: 2 tabs  Day 6: 1 tab 01/26/16   Clayton Bibles, PA-C   Meds Ordered and Administered this Visit   Medications  ketorolac (TORADOL) 30 MG/ML injection 30 mg (not administered)  methylPREDNISolone acetate (DEPO-MEDROL) injection 80 mg (not administered)    BP 117/83 mmHg  Pulse 82  Temp(Src) 97.4 F (36.3 C) (Oral)  Resp 16  SpO2 95%  LMP 02/01/2016 No data found.   Physical Exam  Constitutional: She is oriented to person, place, and time. She appears well-developed and well-nourished. No distress.  Abdominal: Soft. Bowel sounds are normal. There is no tenderness.  Musculoskeletal: She exhibits tenderness.       Lumbar back: She exhibits decreased range of motion, tenderness, pain and spasm. She  exhibits normal pulse.       Back:  Neurological: She is alert and oriented to person, place, and time.  Skin: Skin is warm and dry.  Nursing note and vitals reviewed.   ED Course  Procedures (including critical care time)  Labs Review Labs Reviewed - No data to display  Imaging Review No results found.   Visual Acuity Review  Right Eye Distance:   Left Eye Distance:   Bilateral Distance:    Right Eye Near:   Left Eye Near:    Bilateral Near:         MDM   1. Bilateral sciatica        Billy Fischer, MD 03/05/16 2033

## 2016-03-05 NOTE — ED Notes (Signed)
Pt  Reports  Pain  Down l  Leg     Starting  In her  Back  And  Radiating  Down the  Leg    -     Pt  Denys      Any  specefic  Recent  Injury                she  Reports  She  Has    Had the  Symptoms  For almost

## 2016-03-05 NOTE — ED Notes (Signed)
Pt reports  Symptoms    Not releived  By  otc  meds

## 2016-05-06 ENCOUNTER — Encounter (HOSPITAL_COMMUNITY): Payer: Self-pay | Admitting: Emergency Medicine

## 2016-05-06 ENCOUNTER — Emergency Department (HOSPITAL_COMMUNITY)
Admission: EM | Admit: 2016-05-06 | Discharge: 2016-05-06 | Disposition: A | Discharge status: Home / Self Care | Payer: BLUE CROSS/BLUE SHIELD | Attending: Emergency Medicine | Admitting: Emergency Medicine

## 2016-05-06 DIAGNOSIS — H6691 Otitis media, unspecified, right ear: Secondary | ICD-10-CM | POA: Diagnosis not present

## 2016-05-06 DIAGNOSIS — Z791 Long term (current) use of non-steroidal anti-inflammatories (NSAID): Secondary | ICD-10-CM | POA: Diagnosis not present

## 2016-05-06 DIAGNOSIS — J45901 Unspecified asthma with (acute) exacerbation: Secondary | ICD-10-CM | POA: Diagnosis not present

## 2016-05-06 DIAGNOSIS — R062 Wheezing: Secondary | ICD-10-CM | POA: Diagnosis present

## 2016-05-06 DIAGNOSIS — F17200 Nicotine dependence, unspecified, uncomplicated: Secondary | ICD-10-CM | POA: Insufficient documentation

## 2016-05-06 DIAGNOSIS — Z7952 Long term (current) use of systemic steroids: Secondary | ICD-10-CM | POA: Diagnosis not present

## 2016-05-06 DIAGNOSIS — Z79899 Other long term (current) drug therapy: Secondary | ICD-10-CM | POA: Insufficient documentation

## 2016-05-06 DIAGNOSIS — Z792 Long term (current) use of antibiotics: Secondary | ICD-10-CM | POA: Diagnosis not present

## 2016-05-06 MED ORDER — PREDNISONE 20 MG PO TABS
60.0000 mg | ORAL_TABLET | Freq: Once | ORAL | Status: AC
  Administered 2016-05-06: 60 mg via ORAL
  Filled 2016-05-06: qty 3

## 2016-05-06 MED ORDER — IPRATROPIUM-ALBUTEROL 0.5-2.5 (3) MG/3ML IN SOLN
3.0000 mL | Freq: Once | RESPIRATORY_TRACT | Status: AC
  Administered 2016-05-06: 3 mL via RESPIRATORY_TRACT
  Filled 2016-05-06: qty 3

## 2016-05-06 MED ORDER — PREDNISONE 20 MG PO TABS
40.0000 mg | ORAL_TABLET | Freq: Every day | ORAL | Status: DC
Start: 2016-05-06 — End: 2016-09-27

## 2016-05-06 MED ORDER — ALBUTEROL SULFATE HFA 108 (90 BASE) MCG/ACT IN AERS: 2.0000 | INHALATION_SPRAY | RESPIRATORY_TRACT | Status: DC | PRN

## 2016-05-06 MED ORDER — AMOXICILLIN 500 MG PO CAPS: 500.0000 mg | ORAL_CAPSULE | Freq: Three times a day (TID) | ORAL | Status: DC

## 2016-05-06 MED ORDER — ALBUTEROL SULFATE HFA 108 (90 BASE) MCG/ACT IN AERS
2.0000 | INHALATION_SPRAY | Freq: Once | RESPIRATORY_TRACT | Status: AC
  Administered 2016-05-06: 2 via RESPIRATORY_TRACT
  Filled 2016-05-06: qty 6.7

## 2016-05-06 NOTE — Discharge Instructions (Signed)
Take inhaler 2 puffs every 4 hours. Take amoxicillin as prescribed until all gone for ear infection. Take prednisone as prescribed until all gone. Follow up with your doctor.   Asthma, Acute Bronchospasm Acute bronchospasm caused by asthma is also referred to as an asthma attack. Bronchospasm means your air passages become narrowed. The narrowing is caused by inflammation and tightening of the muscles in the air tubes (bronchi) in your lungs. This can make it hard to breathe or cause you to wheeze and cough. CAUSES Possible triggers are:  Animal dander from the skin, hair, or feathers of animals.  Dust mites contained in house dust.  Cockroaches.  Pollen from trees or grass.  Mold.  Cigarette or tobacco smoke.  Air pollutants such as dust, household cleaners, hair sprays, aerosol sprays, paint fumes, strong chemicals, or strong odors.  Cold air or weather changes. Cold air may trigger inflammation. Winds increase molds and pollens in the air.  Strong emotions such as crying or laughing hard.  Stress.  Certain medicines such as aspirin or beta-blockers.  Sulfites in foods and drinks, such as dried fruits and wine.  Infections or inflammatory conditions, such as a flu, cold, or inflammation of the nasal membranes (rhinitis).  Gastroesophageal reflux disease (GERD). GERD is a condition where stomach acid backs up into your esophagus.  Exercise or strenuous activity. SIGNS AND SYMPTOMS   Wheezing.  Excessive coughing, particularly at night.  Chest tightness.  Shortness of breath. DIAGNOSIS  Your health care provider will ask you about your medical history and perform a physical exam. A chest X-ray or blood testing may be performed to look for other causes of your symptoms or other conditions that may have triggered your asthma attack. TREATMENT  Treatment is aimed at reducing inflammation and opening up the airways in your lungs. Most asthma attacks are treated with  inhaled medicines. These include quick relief or rescue medicines (such as bronchodilators) and controller medicines (such as inhaled corticosteroids). These medicines are sometimes given through an inhaler or a nebulizer. Systemic steroid medicine taken by mouth or given through an IV tube also can be used to reduce the inflammation when an attack is moderate or severe. Antibiotic medicines are only used if a bacterial infection is present.  HOME CARE INSTRUCTIONS   Rest.  Drink plenty of liquids. This helps the mucus to remain thin and be easily coughed up. Only use caffeine in moderation and do not use alcohol until you have recovered from your illness.  Do not smoke. Avoid being exposed to secondhand smoke.  You play a critical role in keeping yourself in good health. Avoid exposure to things that cause you to wheeze or to have breathing problems.  Keep your medicines up-to-date and available. Carefully follow your health care provider's treatment plan.  Take your medicine exactly as prescribed.  When pollen or pollution is bad, keep windows closed and use an air conditioner or go to places with air conditioning.  Asthma requires careful medical care. See your health care provider for a follow-up as advised. If you are more than [redacted] weeks pregnant and you were prescribed any new medicines, let your obstetrician know about the visit and how you are doing. Follow up with your health care provider as directed.  After you have recovered from your asthma attack, make an appointment with your outpatient doctor to talk about ways to reduce the likelihood of future attacks. If you do not have a doctor who manages your asthma, make an  appointment with a primary care doctor to discuss your asthma. SEEK IMMEDIATE MEDICAL CARE IF:   You are getting worse.  You have trouble breathing. If severe, call your local emergency services (911 in the [place]  You develop chest pain or discomfort.  You are  vomiting.  You are not able to keep fluids down.  You are coughing up yellow, green, brown, or bloody sputum.  You have a fever and your symptoms suddenly get worse.  You have trouble swallowing. MAKE SURE YOU:   Understand these instructions.  Will watch your condition.  Will get help right away if you are not doing well or get worse.   This information is not intended to replace advice given to you by your health care provider. Make sure you discuss any questions you have with your health care provider.   Document Released: 03/05/2007 Document Revised: 11/23/2013 Document Reviewed: 05/26/2013 Elsevier Interactive Patient Education 2016 [employer].  Otitis Media, Adult Otitis media is redness, soreness, and inflammation of the middle ear. Otitis media may be caused by allergies or, most commonly, by infection. Often it occurs as a complication of the common cold. SIGNS AND SYMPTOMS Symptoms of otitis media may include:  Earache.  Fever.  Ringing in your ear.  Headache.  Leakage of fluid from the ear. DIAGNOSIS To diagnose otitis media, your health care provider will examine your ear with an otoscope. This is an instrument that allows your health care provider to see into your ear in order to examine your eardrum. Your health care provider also will ask you questions about your symptoms. TREATMENT  Typically, otitis media resolves on its own within 3-5 days. Your health care provider may prescribe medicine to ease your symptoms of pain. If otitis media does not resolve within 5 days or is recurrent, your health care provider may prescribe antibiotic medicines if he or she suspects that a bacterial infection is the cause. HOME CARE INSTRUCTIONS   If you were prescribed an antibiotic medicine, finish it all even if you start to feel better.  Take medicines only as directed by your health care provider.  Keep all follow-up visits as directed by your health care  provider. SEEK MEDICAL CARE IF:  You have otitis media only in one ear, or bleeding from your nose, or both.  You notice a lump on your neck.  You are not getting better in 3-5 days.  You feel worse instead of better. SEEK IMMEDIATE MEDICAL CARE IF:   You have pain that is not controlled with medicine.  You have swelling, redness, or pain around your ear or stiffness in your neck.  You notice that part of your face is paralyzed.  You notice that the bone behind your ear (mastoid) is tender when you touch it. MAKE SURE YOU:   Understand these instructions.  Will watch your condition.  Will get help right away if you are not doing well or get worse.   This information is not intended to replace advice given to you by your health care provider. Make sure you discuss any questions you have with your health care provider.   Document Released: 08/23/2004 Document Revised: 12/09/2014 Document Reviewed: 06/15/2013 Elsevier Interactive Patient Education [employer].

## 2016-05-06 NOTE — ED Notes (Addendum)
Rt ear pain and asthma flaring up x 3 days is out of asthma meds

## 2016-05-06 NOTE — ED Provider Notes (Signed)
CSN: [id number]     Arrival date & time 05/06/16  1533 History  By signing my name below, I, Griselda Miner, attest that this documentation has been prepared under the direction and in the presence of non-physician practitioner, Jeannett Senior, PA-C. Electronically Signed: Griselda Miner, Scribe. 05/06/2016. 5:26 PM.    Chief Complaint  Patient presents with  . Otalgia  . Asthma    The history is provided by the patient. No language interpreter was used.  HPI Comments:  Jasmine Heath is a 45 y.o. female with Hx of asthma who presents to the Emergency Department complaining of worsening dry cough and wheezing for three days. She notes associated trouble breathing and sore throat x3 days. Pt states she is out of her albuterol inhaler. She also c/o rt ear pain that began 2 days ago. She denies any fever or allergies. Pt had recent sick contact. Pt also notes that she donated plasma three days ago and after that she felt sick.  Nothing making her symptoms better or worse. Cough is non productive. No other complaints.     Past Medical History  Diagnosis Date  . Asthma    Past Surgical History  Procedure Laterality Date  . Foot surgery      I+D   Family History  Problem Relation Age of Onset  . Asthma Mother    Social History  Substance Use Topics  . Smoking status: Current Every Day Smoker  . Smokeless tobacco: None  . Alcohol Use: Yes     Comment: occ beer and liquor   OB History    No data available     Review of Systems  Constitutional: Negative for fever.  HENT: Positive for ear pain (right ear) and sore throat.   Respiratory: Positive for cough, shortness of breath and wheezing.   Allergic/Immunologic: Negative for environmental allergies and food allergies.      Allergies  Review of patient's allergies indicates no known allergies.  Home Medications   Prior to Admission medications   Medication Sig Start Date End Date Taking? Authorizing Provider  albuterol  (PROVENTIL HFA;VENTOLIN HFA) 108 (90 BASE) MCG/ACT inhaler Inhale 1-2 puffs into the lungs every 6 (six) hours as needed for wheezing or shortness of breath. 11/17/12   Modena Jansky, MD  amoxicillin (AMOXIL) 500 MG capsule Take 1 capsule (500 mg total) by mouth 3 (three) times daily. 05/26/15   Domenic Moras, PA-C  azithromycin (ZITHROMAX) 250 MG tablet Take 1 tablet (250 mg total) by mouth daily. Take first 2 tablets together, then 1 every day until finished. 11/13/15   Orpah Greek, MD  benzonatate (TESSALON) 100 MG capsule Take 1 capsule (100 mg total) by mouth 3 (three) times daily as needed for cough. 01/26/16   Clayton Bibles, PA-C  chlorhexidine (PERIDEX) 0.12 % solution Use as directed 15 mLs in the mouth or throat 2 (two) times daily. 05/26/15   Domenic Moras, PA-C  cyclobenzaprine (FLEXERIL) 5 MG tablet Take 1 tablet (5 mg total) by mouth 3 (three) times daily as needed for muscle spasms. 03/05/16   Billy Fischer, MD  diclofenac (CATAFLAM) 50 MG tablet Take 1 tablet (50 mg total) by mouth 3 (three) times daily. Prn sciatica 03/05/16   Billy Fischer, MD  naproxen (NAPROSYN) 375 MG tablet Take 1 tablet (375 mg total) by mouth 2 (two) times daily. 01/07/16   Margarita Mail, PA-C  oxyCODONE-acetaminophen (PERCOCET) 5-325 MG tablet Take 1-2 tablets by mouth every 4 (four) hours as  needed. 01/07/16   Margarita Mail, PA-C  predniSONE (STERAPRED UNI-PAK 21 TAB) 10 MG (21) TBPK tablet Take 1 tablet (10 mg total) by mouth daily. Day 1: take 6 tabs.  Day 2: 5 tabs  Day 3: 4 tabs  Day 4: 3 tabs  Day 5: 2 tabs  Day 6: 1 tab 01/26/16   Clayton Bibles, PA-C   BP 139/97 mmHg  Pulse 77  Temp(Src) 98.7 F (37.1 C) (Oral)  Resp 18  SpO2 100% Physical Exam  Constitutional: She is oriented to person, place, and time. She appears well-developed and well-nourished. No distress.  HENT:  Head: Normocephalic and atraumatic.  Right TM erythematous, bulging. Left TM and ear canal are normal. Oropharynx clear, uvula midline.   Eyes: Conjunctivae are normal.  Neck: Neck supple.  Cardiovascular: Normal rate, regular rhythm and normal heart sounds.   Pulmonary/Chest: Effort normal. No respiratory distress. She has wheezes. She has no rales.  Abdominal: She exhibits no distension.  Musculoskeletal: She exhibits no edema.  Neurological: She is alert and oriented to person, place, and time.  Skin: Skin is warm and dry.  Psychiatric: She has a normal mood and affect.  Nursing note and vitals reviewed.   ED Course  Procedures  DIAGNOSTIC STUDIES:  Oxygen Saturation is 100% on RA, normal by my interpretation.    COORDINATION OF CARE:  5:02 PM Will order breathing treatment, prednisone, and will discharge with inhaler for asthma and abx for ear. Discussed treatment plan with pt at bedside and pt agreed to plan.  Labs Review Labs Reviewed - No data to display  Imaging Review No results found. I have personally reviewed and evaluated these images and lab results as part of my medical decision-making.   EKG Interpretation None      MDM   Final diagnoses:  Acute right otitis media, recurrence not specified, unspecified otitis media type  Asthma exacerbation    Patient to the emergency department with inspiratory and expiratory wheezes, decreased air movement bilaterally, right otitis media based on exam. She received one breathing treatment and felt much better. Also received 60 mg of prednisone emergency department. Her vital signs are normal, her oxygen is 100% on room air. Will discharge home with short prednisone burst, as well as amoxicillin for right otitis media, and an inhaler. Instructed to follow with her doctor, return precautions discussed.  Filed Vitals:   05/06/16 1658  BP: 139/97  Pulse: 77  Temp: 98.7 F (37.1 C)  Resp: 18    I personally performed the services described in this documentation, which was scribed in my presence. The recorded information has been reviewed and is  accurate.   Jeannett Senior, PA-C 05/06/16 1810  Sherwood Gambler, MD 05/07/16 [phone]

## 2016-05-06 NOTE — ED Notes (Signed)
Declined W/C at D/C and was escorted to lobby by RN. 

## 2016-09-27 ENCOUNTER — Ambulatory Visit (HOSPITAL_COMMUNITY)
Admission: EM | Admit: 2016-09-27 | Discharge: 2016-09-27 | Disposition: A | Discharge status: Home / Self Care | Payer: BLUE CROSS/BLUE SHIELD | Attending: Internal Medicine | Admitting: Internal Medicine

## 2016-09-27 ENCOUNTER — Encounter (HOSPITAL_COMMUNITY): Payer: Self-pay | Admitting: Family Medicine

## 2016-09-27 DIAGNOSIS — M79606 Pain in leg, unspecified: Secondary | ICD-10-CM | POA: Diagnosis present

## 2016-09-27 DIAGNOSIS — M79601 Pain in right arm: Secondary | ICD-10-CM | POA: Diagnosis not present

## 2016-09-27 DIAGNOSIS — M79602 Pain in left arm: Secondary | ICD-10-CM | POA: Diagnosis not present

## 2016-09-27 DIAGNOSIS — F17200 Nicotine dependence, unspecified, uncomplicated: Secondary | ICD-10-CM | POA: Diagnosis not present

## 2016-09-27 DIAGNOSIS — M79603 Pain in arm, unspecified: Secondary | ICD-10-CM | POA: Diagnosis present

## 2016-09-27 DIAGNOSIS — M7989 Other specified soft tissue disorders: Secondary | ICD-10-CM | POA: Diagnosis present

## 2016-09-27 DIAGNOSIS — M79604 Pain in right leg: Secondary | ICD-10-CM | POA: Insufficient documentation

## 2016-09-27 DIAGNOSIS — M79605 Pain in left leg: Secondary | ICD-10-CM | POA: Insufficient documentation

## 2016-09-27 DIAGNOSIS — Z79899 Other long term (current) drug therapy: Secondary | ICD-10-CM | POA: Diagnosis not present

## 2016-09-27 LAB — BASIC METABOLIC PANEL
Anion gap: 5 (ref 5–15)
BUN: 7 mg/dL (ref 6–20)
CO2: 25 mmol/L (ref 22–32)
Calcium: 8.9 mg/dL (ref 8.9–10.3)
Chloride: 107 mmol/L (ref 101–111)
Creatinine, Ser: 0.8 mg/dL (ref 0.44–1.00)
GFR calc Af Amer: >60 mL/min (ref >60)
GFR calc non Af Amer: >60 mL/min (ref >60)
Glucose, Bld: 110 mg/dL — ABNORMAL HIGH (ref 65–99)
Potassium: 3.7 mmol/L (ref 3.5–5.1)
Sodium: 137 mmol/L (ref 135–145)

## 2016-09-27 LAB — CBC
HCT: 34.9 % — ABNORMAL LOW (ref 36.0–46.0)
Hemoglobin: 11.4 g/dL — ABNORMAL LOW (ref 12.0–15.0)
MCH: 29.2 pg (ref 26.0–34.0)
MCHC: 32.7 g/dL (ref 30.0–36.0)
MCV: 89.5 fL (ref 78.0–100.0)
Platelets: 244 10*3/uL (ref 150–400)
RBC: 3.9 MIL/uL (ref 3.87–5.11)
RDW: 14.1 % (ref 11.5–15.5)
WBC: 5.5 10*3/uL (ref 4.0–10.5)

## 2016-09-27 LAB — SEDIMENTATION RATE: Sed Rate: 2 mm/hr (ref 0–22)

## 2016-09-27 LAB — C-REACTIVE PROTEIN: CRP: <0.8 mg/dL (ref <1.0)

## 2016-09-27 MED ORDER — KETOROLAC TROMETHAMINE 60 MG/2ML IM SOLN
INTRAMUSCULAR | Status: AC
  Filled 2016-09-27: qty 2

## 2016-09-27 MED ORDER — METHYLPREDNISOLONE ACETATE 80 MG/ML IJ SUSP
80.0000 mg | Freq: Once | INTRAMUSCULAR | Status: AC
  Administered 2016-09-27: 80 mg via INTRAMUSCULAR

## 2016-09-27 MED ORDER — PREDNISONE 20 MG PO TABS: ORAL_TABLET | ORAL | 0 refills | Status: DC

## 2016-09-27 MED ORDER — MELOXICAM 7.5 MG PO TABS: ORAL_TABLET | ORAL | 0 refills | Status: DC

## 2016-09-27 MED ORDER — KETOROLAC TROMETHAMINE 60 MG/2ML IM SOLN
60.0000 mg | Freq: Once | INTRAMUSCULAR | Status: AC
  Administered 2016-09-27: 60 mg via INTRAMUSCULAR

## 2016-09-27 MED ORDER — METHYLPREDNISOLONE ACETATE 80 MG/ML IJ SUSP
INTRAMUSCULAR | Status: AC
  Filled 2016-09-27: qty 1

## 2016-09-27 NOTE — Discharge Instructions (Signed)
Meloxicam (Mobic) is an antiinflammatory to help with pain and inflammation.  Do not take ibuprofen, Advil, Aleve, or any other medications that contain NSAIDs while taking meloxicam as this may cause stomach upset or even ulcers if taken in large amounts for an extended period of time.   You were given a shot of depomedrol (a steroid) today to help with muscle pain and swelling.  You have been prescribed prednisone, an oral steroid.  You may start this medication tomorrow with breakfast.    Emergency Department Resource Guide 1) Find a Doctor and Pay Out of Pocket Although you won't have to find out who is covered by your insurance plan, it is a good idea to ask around and get recommendations. You will then need to call the office and see if the doctor you have chosen will accept you as a new patient and what types of options they offer for patients who are self-pay. Some doctors offer discounts or will set up payment plans for their patients who do not have insurance, but you will need to ask so you aren't surprised when you get to your appointment.  2) Contact Your Local Health Department Not all health departments have doctors that can see patients for sick visits, but many do, so it is worth a call to see if yours does. If you don't know where your local health department is, you can check in your phone book. The CDC also has a tool to help you locate your state's health department, and many state websites also have listings of all of their local health departments.  3) Find a [facility] If your illness is not likely to be very severe or complicated, you may want to try a walk in clinic. These are popping up all over the country in pharmacies, drugstores, and shopping centers. They're usually staffed by nurse practitioners or physician assistants that have been trained to treat common illnesses and complaints. They're usually fairly quick and inexpensive. However, if you have serious medical  issues or chronic medical problems, these are probably not your best option.  No Primary Care Doctor: - Call Health Connect at  [phone] - they can help you locate a primary care doctor that  accepts your insurance, provides certain services, etc. - Physician Referral Service- [phone]  Chronic Pain Problems: Organization         Address  Phone   Notes  [facility]  [phone] Patients need to be referred by their primary care doctor.   Medication Assistance: Organization         Address  Phone   Notes  [facility] Medication [facility] [address] [place], [place] [address] [phone] --Must be a resident of [facility] -- Must have NO insurance coverage whatsoever (no Medicaid/ Medicare, etc.) -- The pt. MUST have a primary care doctor that directs their care regularly and follows them in the community   MedAssist  [phone]   [employer]  [phone]    Agencies that provide inexpensive medical care: Microbiologist  Notes  [place]  [phone]   [facility]    [phone]   [facility] [place], [place] [address] [phone]   [place] [address], [place] [phone]   Planned Parenthood    [phone]   [facility]    [phone]   [address], [place] Phone:  [phone], Fax:  [phone] Hours of Operation:  9 am - 6 pm, M-F.  Also accepts Medicaid/Medicare and self-pay.  [facility] for [place], [address], [place] Phone: [phone], Fax: [phone]. Hours of Operation:  8:30 am - 5:30 pm, M-F.  Also accepts Medicaid and self-pay.   [facility] [place] [address] [place] Phone: [phone]   [place], [place], [place] [phone], Ext. [phone] Mondays & Thursdays: 7-9 AM.  First 15 patients are seen on a first come, first serve basis.    [place] Providers:  Organization         Address                                                                       Phone                               Notes  [facility] [address],  [phone] Also accepts self-pay patients.  [facility] [id number] [place], [place]  [phone]   [place], [address], [place] [phone]   [facility] [address] [place] [phone]   Lucianne Lei [address], [place]   [phone] Only accepts [place] Access [place] patients after they have their name applied to their card.   Self-Pay (no insurance) in [facility]:   Organization         Address                                                     Phone               Notes  Sickle Cell Patients, [facility] [place] [phone]   [facility] [place] [phone]   [facility] [place]  [place], [place], [place] [phone]   [facility]/Dr. Osei-Bonsu  [address] [place] or [address], [place] [phone] Phone number for both [place] and [place] locations is the same.  Urgent Medical and [facility] [address], [place] [phone]   [facility]: [place] Rhonda W [address] [place] or [address] [phone]   [facility] [place] [phone], phone; [phone], fax Sees patients 1st and 3rd Saturday of  every month.  Must not qualify for public or private insurance (i.e. Medicaid, Medicare, [place] Health Choice, Veterans' Benefits)  Household income should be no more than 200% of the poverty level The clinic cannot treat you if you are pregnant or think you are pregnant  Sexually transmitted diseases are not treated at the clinic.    Dental Care: Organization         Address                                  Phone                       Notes  [facility] Department of [facility] [place] [phone] Accepts children up to age 1 who are enrolled in [place] or [place]; pregnant women with a Medicaid card; and children who have applied for Medicaid or [place] Health Choice, but were declined, whose parents can pay a reduced fee at time of service.  [facility] Department of [facility]  [address], [place] [phone] Accepts children up to age 37 who are enrolled in [place] or [place]; pregnant women with a Medicaid card; and children who have applied for Medicaid or [place] Health Choice, but were declined, whose parents can pay a reduced fee at time of service.  [place] Adult Dental Access PROGRAM  [place] [phone] Patients are seen by appointment only. Walk-ins are not accepted. [place] will see patients 47 years of age and older. Monday - Tuesday (8am-5pm) Most Wednesdays (8:30-5pm) $30 per visit, cash only  [facility] Adult Dental Access PROGRAM  [address], [facility] [phone] Patients are seen by appointment only. Walk-ins are not accepted. [place] will see patients 30 years of age and older. One Wednesday Evening (Monthly: Volunteer Based).  $30 per visit, cash only  [place]  [phone] for adults; Children under age 90, call Graduate Pediatric Dentistry at [phone]. Children aged 58-14, please call [phone] to request a pediatric application.  Dental services are provided in all areas of dental care including fillings, crowns and bridges, complete and partial dentures, implants, gum treatment, root canals, and extractions. Preventive care is also provided. Treatment is provided to both adults and children. Patients are selected via a lottery and there is often a waiting list.   [facility] [address], [place]  [phone] [email or web address]   [facility] [address] [place], [place] [phone], Ext. [phone] Second and Fourth Thursday of each month, opens at 6:30 AM; Clinic ends at 9 AM.  Patients are seen on a first-come first-served basis, and a limited number are seen during each clinic.   [facility]  [address] Hillard Danker [place], [place] [phone]   Eligibility Requirements You must have lived in [place], [place], or [place] counties for at least the last three months.   You cannot be eligible for state or federal sponsored [employer], including [employer], [place], or [employer].   You generally cannot be eligible for healthcare insurance through your employer.    How to apply: Eligibility screenings are held every  Tuesday and Wednesday afternoon from 1:00 pm until 4:00 pm. You do not need an appointment for the interview!  [facility] [address] [place], [place]   [place]  [place]  [place]  [phone]    Behavioral Health Resources in the Community: Intensive Outpatient Programs Organization         Address                                              Phone              Notes  [place]. [address], [place], [place] [phone]   [facility] [address] [place], [place]   ADS: Alcohol &  Drug Svcs [address], [place], [place]   [place] [address]  [place], [place] or [phone]   Substance Abuse Resources Organization         Address                                Phone  Notes  Alcohol and Drug Services  [phone]   [place]  [phone]   The [place]   Chinita Pester  [phone]   Residential & Outpatient Substance Abuse Program  [phone]   Psychological Services Organization         Address                                  Phone                Notes  [facility] [place]  [place]  [phone]   [place] [address] [place] or [phone]    Mobile Crisis Teams Organization         Address  Phone  Notes  [facility], Mobile Crisis Care Unit  [phone]   [facility]ive Psychotherapeutic Services  [address] [place], [place]   [place] [address] [place] [phone]    Self-Help/Support Groups Organization         Address                         Phone             Notes  [place]. of [place] - variety of support groups  [place] Call for more information  Narcotics Anonymous (NA), [facility]aring [facility] [address], [employer] [place]  2 meetings at this location   Special educational needs teacher         Address                                                    Phone              Notes  [facility] Residential Treatment [phone]  [address],    [place]  [phone]   [facility]  [address], [place] [phone], [place], [place]   [place], [place] [phone] Admissions: 8am-3pm M-F  Incentives Substance [place] [address],    [place], [place] [phone]   [facility] [address] [place], [place], [place]   [facility] [address],  [place], [place]   [facility] Programs - Intensive Outpatient [address] Kristeen [address], [place], [place]   [facility] ([place] [address]  [place], [place] [phone] or [phone]   [facility] Services (RTS) [address] [place], [place] Accepts Medicaid  [facility] [place] [address],  [place] [phone] Substance Abuse/Addiction Treatment   [facility] Organization         Address                                                            Phone                    Notes  [facility]  [phone]   Domenic Schwab, PhD [address] Arlis Porta [place], [place]   [phone] or [phone]   [place], [place] [phone]   [facility] [address], [place], [place] [phone] Insurance/Medicaid/sponsorship through [facility] [place] and Families [address], [address]                                    [place], [place] [phone] [place] [address], [place] [phone]    Dr. Adele Schilder  [phone]   [facility] Dept. 1) [address] [place] 2) [place] 3)  [place] [address], [place] [phone]  [phone]   [place] [phone] or [phone] (After Hours)

## 2016-09-27 NOTE — ED Provider Notes (Signed)
CSN: [id number]     Arrival date & time 09/27/16  1025 History   First MD Initiated Contact with Patient 09/27/16 1126     Chief Complaint  Patient presents with  . Arm Pain  . Leg Pain  . Leg Swelling   (Consider location/radiation/quality/duration/timing/severity/associated sxs/prior Treatment) HPI  Jasmine Heath is a 45 y.o. female presenting to UC with c/o diffuse body pain that is worse in upper and lower extremities bilaterally.  She notes she does a lot of heavy lifting for work and is on her feet all day.  Pain is aching and cramping, started several months ago w/o known injury.  She has been taking Aleve but only minimal relief. Denies fever, chills, n/v/d. She does not have a PCP as she cannot get an appointment until January 2018.   Past Medical History:  Diagnosis Date  . Asthma    Past Surgical History:  Procedure Laterality Date  . FOOT SURGERY     I+D   Family History  Problem Relation Age of Onset  . Asthma Mother    Social History  Substance Use Topics  . Smoking status: Current Every Day Smoker  . Smokeless tobacco: Never Used  . Alcohol use Yes     Comment: occ beer and liquor   OB History    No data available     Review of Systems  Constitutional: Negative for chills, fatigue and fever.  Musculoskeletal: Positive for arthralgias, joint swelling and myalgias. Negative for back pain, gait problem, neck pain and neck stiffness.  Skin: Negative for color change and rash.  Neurological: Negative for weakness and numbness.    Allergies  Review of patient's allergies indicates no known allergies.  Home Medications   Prior to Admission medications   Medication Sig Start Date End Date Taking? Authorizing Provider  albuterol (PROVENTIL HFA;VENTOLIN HFA) 108 (90 BASE) MCG/ACT inhaler Inhale 1-2 puffs into the lungs every 6 (six) hours as needed for wheezing or shortness of breath. 11/17/12   Modena Jansky, MD  albuterol (PROVENTIL HFA;VENTOLIN HFA)  108 (90 Base) MCG/ACT inhaler Inhale 2 puffs into the lungs every 4 (four) hours as needed for wheezing or shortness of breath. 05/06/16   Tatyana Kirichenko, PA-C  meloxicam (MOBIC) 7.5 MG tablet Take 2 tabs (15mg ) daily for 7 days, then 1-2 tabs daily as needed for pain. 09/27/16   Noland Fordyce, PA-C  predniSONE (DELTASONE) 20 MG tablet 3 tabs po day one, then 2 po daily x 4 days 09/27/16   Noland Fordyce, PA-C   Meds Ordered and Administered this Visit   Medications  ketorolac (TORADOL) injection 60 mg (60 mg Intramuscular Given 09/27/16 1148)  methylPREDNISolone acetate (DEPO-MEDROL) injection 80 mg (80 mg Intramuscular Given 09/27/16 1148)    BP 143/80   Pulse 76   Temp 98.6 F (37 C) (Oral)   Resp 18   LMP 08/29/2016   SpO2 97%  No data found.   Physical Exam  Constitutional: She is oriented to person, place, and time. She appears well-developed and well-nourished. No distress.  Obese female sitting on exam bed. NAD.   HENT:  Head: Normocephalic and atraumatic.  Eyes: EOM are normal.  Neck: Normal range of motion.  Cardiovascular: Normal rate.   Pulmonary/Chest: Effort normal. No respiratory distress.  Musculoskeletal: Normal range of motion. She exhibits edema and tenderness.  Diffuse tenderness to muscles of upper and lower extremities bilaterally. Muscle compartments are soft. Full ROM upper and lower extremities bilaterally. Mild edema in  Left ankle compared to Right.  No midline spinal tenderness.  Neurological: She is alert and oriented to person, place, and time.  Reflex Scores:      Patellar reflexes are 2+ on the right side and 2+ on the left side. Skin: Skin is warm and dry. No rash noted. She is not diaphoretic. No erythema.  Psychiatric: She has a normal mood and affect. Her behavior is normal.  Nursing note and vitals reviewed.   Urgent Care Course   Clinical Course    Procedures (including critical care time)  Labs Review Labs Reviewed  C-REACTIVE  PROTEIN  SEDIMENTATION RATE  BASIC METABOLIC PANEL  CBC    Imaging Review No results found.    MDM   1. Pain in both upper extremities   2. Pain in both lower extremities    Pt c/o several months of pain in upper and lower extremities. She performed a lot of heavy lifting and on her feet often for work. No known injury. No evidence of underlying infection. No spinal tenderness. No red flag symptoms.  Will order CBC, BMP, Sed Rate and CRP to check for potential electrolyte imbalance and/or arthritic type cause of diffuse pain  Tx in UC: Depomedrol and toradol  Rx: Prednisone and meloxicam Resource guide provided in hopes pt can establish care with a PCP sooner than January.    Noland Fordyce, PA-C 09/27/16 [place], PA-C 09/27/16 1319

## 2016-09-27 NOTE — ED Triage Notes (Signed)
Pt here for pain in her whole right side over months. sts also left ankle swelling. Denies injury,

## 2017-02-20 ENCOUNTER — Ambulatory Visit (HOSPITAL_COMMUNITY)
Admission: EM | Admit: 2017-02-20 | Discharge: 2017-02-20 | Disposition: A | Discharge status: Home / Self Care | Payer: BLUE CROSS/BLUE SHIELD | Attending: Family Medicine | Admitting: Family Medicine

## 2017-02-20 ENCOUNTER — Encounter (HOSPITAL_COMMUNITY): Payer: Self-pay | Admitting: Family Medicine

## 2017-02-20 DIAGNOSIS — K5901 Slow transit constipation: Secondary | ICD-10-CM

## 2017-02-20 DIAGNOSIS — R1010 Upper abdominal pain, unspecified: Secondary | ICD-10-CM

## 2017-02-20 MED ORDER — OMEPRAZOLE 20 MG PO CPDR: 20.0000 mg | DELAYED_RELEASE_CAPSULE | Freq: Every day | ORAL | 1 refills | Status: DC

## 2017-02-20 MED ORDER — POLYETHYLENE GLYCOL 3350 17 GM/SCOOP PO POWD: 1.0000 | Freq: Two times a day (BID) | ORAL | 0 refills | Status: DC

## 2017-02-20 NOTE — ED Triage Notes (Signed)
PT reports epigastric pain that is worse with eating and with laying down. PT reports pain is burning and travels up her throat. PT reports she feels like she needs to vomit. PT reports she has been constipated as well.

## 2017-02-20 NOTE — Discharge Instructions (Signed)
Please go to the emergency room if your pain continues more than 24 hours unchanged. You may have a problem with the pancreas or gallbladder.

## 2017-02-20 NOTE — ED Provider Notes (Signed)
[name]    CSN: [id number] Arrival date & time: 02/20/17  1742     History   Chief Complaint Chief Complaint  Patient presents with  . Abdominal Pain    HPI Jasmine Heath is a 46 y.o. female.   PT reports epigastric pain that is worse with eating and with laying down. PT reports pain is burning and travels up her throat. PT reports she feels like she needs to vomit. PT reports she has been constipated as well.   Patient states that most of the pain comes after eating. She works at [employer] and had a particularly bad episode after eating a hot dog yesterday.      Past Medical History:  Diagnosis Date  . Asthma     Patient Active Problem List   Diagnosis Date Noted  . Diabetes mellitus ([place]) 11/17/2012  . Anemia 11/15/2012  . Pain due to dental caries 11/15/2012  . Status asthmaticus 11/14/2012  . SOB (shortness of breath) 11/14/2012  . Morbid obesity ([place]) 11/14/2012  . Hypokalemia 11/14/2012    Past Surgical History:  Procedure Laterality Date  . FOOT SURGERY     I+D    OB History    No data available       Home Medications    Prior to Admission medications   Medication Sig Start Date End Date Taking? Authorizing Provider  albuterol (PROVENTIL HFA;VENTOLIN HFA) 108 (90 BASE) MCG/ACT inhaler Inhale 1-2 puffs into the lungs every 6 (six) hours as needed for wheezing or shortness of breath. 11/17/12   Modena Jansky, MD  albuterol (PROVENTIL HFA;VENTOLIN HFA) 108 (90 Base) MCG/ACT inhaler Inhale 2 puffs into the lungs every 4 (four) hours as needed for wheezing or shortness of breath. 05/06/16   Tatyana Kirichenko, PA-C  omeprazole (PRILOSEC) 20 MG capsule Take 1 capsule (20 mg total) by mouth daily. 02/20/17   Robyn Haber, MD  polyethylene glycol powder (MIRALAX) powder Take 255 g by mouth 2 (two) times daily. 02/20/17   Robyn Haber, MD    Family History Family History  Problem Relation Age of Onset  . Asthma  Mother     Social History Social History  Substance Use Topics  . Smoking status: Current Every Day Smoker    Packs/day: 0.25    Types: Cigarettes  . Smokeless tobacco: Never Used  . Alcohol use No     Comment: occ beer and liquor     Allergies   Patient has no known allergies.   Review of Systems Review of Systems  Constitutional: Positive for appetite change.  HENT: Negative.   Gastrointestinal: Positive for abdominal pain, constipation, nausea and vomiting.  Musculoskeletal: Negative.   Neurological: Negative.      Physical Exam Triage Vital Signs ED Triage Vitals  Enc Vitals Group     BP      Pulse      Resp      Temp      Temp src      SpO2      Weight      Height      Head Circumference      Peak Flow      Pain Score      Pain Loc      Pain Edu?      Excl. in [place]?    No data found.   Updated Vital Signs BP 111/72   Pulse 86   Temp 98.8 F (37.1 C) (Oral)  Resp 16   Ht 5\' 6"  (1.676 m)   Wt 280 lb (127 kg)   LMP 01/30/2017   SpO2 95%   BMI 45.19 kg/m    Physical Exam  Constitutional: She is oriented to person, place, and time. She appears well-developed and well-nourished.  Morbidly obese  HENT:  Right Ear: External ear normal.  Left Ear: External ear normal.  Mouth/Throat: Oropharynx is clear and moist.  Eyes: Conjunctivae and EOM are normal. Pupils are equal, round, and reactive to light.  Neck: Normal range of motion. Neck supple.  Pulmonary/Chest: Effort normal.  Abdominal: Soft. Bowel sounds are normal. She exhibits no mass. There is tenderness. There is no rebound and no guarding.  Tender epigastrium, left upper quadrant, and right upper quadrant  Musculoskeletal: Normal range of motion.  Neurological: She is alert and oriented to person, place, and time.  Skin: Skin is warm and dry.  Psychiatric: She has a normal mood and affect.  Nursing note and vitals reviewed.    UC Treatments / Results  Labs (all labs ordered are  listed, but only abnormal results are displayed) Labs Reviewed - No data to display  EKG  EKG Interpretation None       Radiology No results found.  Procedures Procedures (including critical care time)  Medications Ordered in UC Medications - No data to display   Initial Impression / Assessment and Plan / UC Course  I have reviewed the triage vital signs and the nursing notes.  Pertinent labs & imaging results that were available during my care of the patient were reviewed by me and considered in my medical decision making (see chart for details).      Final Clinical Impressions(s) / UC Diagnoses   Final diagnoses:  Pain of upper abdomen  Slow transit constipation    New Prescriptions New Prescriptions   OMEPRAZOLE (PRILOSEC) 20 MG CAPSULE    Take 1 capsule (20 mg total) by mouth daily.   POLYETHYLENE GLYCOL POWDER (MIRALAX) POWDER    Take 255 g by mouth 2 (two) times daily.     Robyn Haber, MD 02/20/17 [phone]

## 2017-05-12 ENCOUNTER — Ambulatory Visit (HOSPITAL_COMMUNITY)
Admission: EM | Admit: 2017-05-12 | Discharge: 2017-05-12 | Disposition: A | Discharge status: Home / Self Care | Payer: BLUE CROSS/BLUE SHIELD | Attending: Internal Medicine | Admitting: Internal Medicine

## 2017-05-12 ENCOUNTER — Encounter (HOSPITAL_COMMUNITY): Payer: Self-pay | Admitting: Emergency Medicine

## 2017-05-12 DIAGNOSIS — K047 Periapical abscess without sinus: Secondary | ICD-10-CM | POA: Diagnosis not present

## 2017-05-12 MED ORDER — PENICILLIN V POTASSIUM 500 MG PO TABS: 500.0000 mg | ORAL_TABLET | Freq: Two times a day (BID) | ORAL | 0 refills | Status: DC

## 2017-05-12 MED ORDER — HYDROCODONE-ACETAMINOPHEN 5-325 MG PO TABS: 1.0000 | ORAL_TABLET | Freq: Four times a day (QID) | ORAL | 0 refills | Status: DC | PRN

## 2017-05-12 MED ORDER — KETOROLAC TROMETHAMINE 60 MG/2ML IM SOLN
60.0000 mg | Freq: Once | INTRAMUSCULAR | Status: AC
  Administered 2017-05-12: 60 mg via INTRAMUSCULAR

## 2017-05-12 MED ORDER — KETOROLAC TROMETHAMINE 60 MG/2ML IM SOLN
INTRAMUSCULAR | Status: AC
  Filled 2017-05-12: qty 2

## 2017-05-12 NOTE — ED Triage Notes (Signed)
Right side of face swelling, dental pain and right side of throat sore.  Onset 2 days ago

## 2017-05-12 NOTE — Discharge Instructions (Addendum)
Injection of ketorolac, anti-inflammatory/pain reliever, given at urgent care tonight. Prescription for penicillin sent to the pharmacy. Prescription for small number of Vicodin was printed. Anticipate gradual improvement in pain and swelling over the next several days. Recheck for marked increase in pain/swelling, new fever greater than 100.5, or if not starting to improve in a few days. Follow-up with a dentist for definitive management of the bad tooth.

## 2017-05-12 NOTE — ED Provider Notes (Signed)
[facility]    CSN: [id number] Arrival date & time: 05/12/17  1909     History   Chief Complaint Chief Complaint  Patient presents with  . Dental Pain    HPI Jasmine Heath is a 46 y.o. female. She presents today with dental pain around a right lower bicuspid, swelling of the right cheek, right throat feels a little sore. No fever, no malaise. Symptom onset about 2 days ago.    HPI  Past Medical History:  Diagnosis Date  . Asthma     Patient Active Problem List   Diagnosis Date Noted  . Diabetes mellitus ([place]) 11/17/2012  . Anemia 11/15/2012  . Pain due to dental caries 11/15/2012  . Status asthmaticus 11/14/2012  . SOB (shortness of breath) 11/14/2012  . Morbid obesity ([place]) 11/14/2012  . Hypokalemia 11/14/2012    Past Surgical History:  Procedure Laterality Date  . FOOT SURGERY     I+D     Home Medications    Prior to Admission medications   Medication Sig Start Date End Date Taking? Authorizing Provider  albuterol (PROVENTIL HFA;VENTOLIN HFA) 108 (90 BASE) MCG/ACT inhaler Inhale 1-2 puffs into the lungs every 6 (six) hours as needed for wheezing or shortness of breath. 11/17/12   Hongalgi, Lenis Dickinson, MD  albuterol (PROVENTIL HFA;VENTOLIN HFA) 108 (90 Base) MCG/ACT inhaler Inhale 2 puffs into the lungs every 4 (four) hours as needed for wheezing or shortness of breath. 05/06/16   Kirichenko, Lahoma Rocker, PA-C  HYDROcodone-acetaminophen (NORCO/VICODIN) 5-325 MG tablet Take 1 tablet by mouth 4 (four) times daily as needed. 05/12/17   Sherlene Shams, MD  omeprazole (PRILOSEC) 20 MG capsule Take 1 capsule (20 mg total) by mouth daily. 02/20/17   Robyn Haber, MD  penicillin v potassium (VEETID) 500 MG tablet Take 1 tablet (500 mg total) by mouth 2 (two) times daily. 05/12/17   Sherlene Shams, MD  polyethylene glycol powder [facility]) powder Take 255 g by mouth 2 (two) times daily. 02/20/17   Robyn Haber, MD    Family History Family History  Problem  Relation Age of Onset  . Asthma Mother     Social History Social History  Substance Use Topics  . Smoking status: Current Every Day Smoker    Packs/day: 0.25    Types: Cigarettes  . Smokeless tobacco: Never Used  . Alcohol use No     Comment: occ beer and liquor     Allergies   Patient has no known allergies.   Review of Systems Review of Systems  All other systems reviewed and are negative.    Physical Exam Triage Vital Signs ED Triage Vitals  Enc Vitals Group     BP 05/12/17 2008 134/75     Pulse Rate 05/12/17 2008 78     Resp 05/12/17 2008 18     Temp 05/12/17 2008 98.3 F (36.8 C)     Temp Source 05/12/17 2008 Oral     SpO2 05/12/17 2008 100 %     Weight --      Height --      Pain Score 05/12/17 2006 8     Pain Loc --    Updated Vital Signs BP 134/75 (BP Location: Right Arm) Comment (BP Location): regular cuff on forearm  Pulse 78   Temp 98.3 F (36.8 C) (Oral)   Resp 18   SpO2 100%   Physical Exam  Constitutional: She is oriented to person, place, and time. No distress.  HENT:  Head: Atraumatic.  Some absent teeth. Right lower bicuspid is loose, with a fragment broken off, and a gum boil surrounding the base. Very tender. Mild trismus Right cheek is slightly swollen  Eyes:  Conjugate gaze observed, no eye redness/discharge  Neck: Neck supple.  Cardiovascular: Normal rate.   Pulmonary/Chest: No respiratory distress.  Abdominal: She exhibits no distension.  Musculoskeletal: Normal range of motion.  Neurological: She is alert and oriented to person, place, and time.  Skin: Skin is warm and dry.  Nursing note and vitals reviewed.    UC Treatments / Results   Procedures Procedures (including critical care time)  Medications Ordered in UC Medications  ketorolac (TORADOL) injection 60 mg (60 mg Intramuscular Given 05/12/17 2041)    Final Clinical Impressions(s) / UC Diagnoses   Final diagnoses:  Dental infection   Injection of  ketorolac, anti-inflammatory/pain reliever, given at urgent care tonight. Prescription for penicillin sent to the pharmacy. Prescription for small number of Vicodin was printed. Anticipate gradual improvement in pain and swelling over the next several days. Recheck for marked increase in pain/swelling, new fever greater than 100.5, or if not starting to improve in a few days. Follow-up with a dentist for definitive management of the bad tooth.  New Prescriptions Current Discharge Medication List    START taking these medications   Details  HYDROcodone-acetaminophen (NORCO/VICODIN) 5-325 MG tablet Take 1 tablet by mouth 4 (four) times daily as needed. Qty: 10 tablet, Refills: 0    penicillin v potassium (VEETID) 500 MG tablet Take 1 tablet (500 mg total) by mouth 2 (two) times daily. Qty: 20 tablet, Refills: 0         Sherlene Shams, MD 05/14/17 1428

## 2017-05-25 ENCOUNTER — Encounter (HOSPITAL_COMMUNITY): Payer: Self-pay

## 2017-05-25 DIAGNOSIS — E119 Type 2 diabetes mellitus without complications: Secondary | ICD-10-CM | POA: Diagnosis not present

## 2017-05-25 DIAGNOSIS — K0889 Other specified disorders of teeth and supporting structures: Secondary | ICD-10-CM | POA: Insufficient documentation

## 2017-05-25 DIAGNOSIS — F17210 Nicotine dependence, cigarettes, uncomplicated: Secondary | ICD-10-CM | POA: Diagnosis not present

## 2017-05-25 DIAGNOSIS — J45909 Unspecified asthma, uncomplicated: Secondary | ICD-10-CM | POA: Insufficient documentation

## 2017-05-25 NOTE — ED Triage Notes (Signed)
Pt complaining of R lower dental pain. Pt states seen by UC 2 weeks ago. Pt states rx'd abx, no improvement. Pt states increased pain and swelling.

## 2017-05-26 ENCOUNTER — Emergency Department (HOSPITAL_COMMUNITY)
Admission: EM | Admit: 2017-05-26 | Discharge: 2017-05-26 | Disposition: A | Discharge status: Home / Self Care | Payer: BLUE CROSS/BLUE SHIELD | Attending: Emergency Medicine | Admitting: Emergency Medicine

## 2017-05-26 DIAGNOSIS — K0889 Other specified disorders of teeth and supporting structures: Secondary | ICD-10-CM

## 2017-05-26 MED ORDER — BUPIVACAINE-EPINEPHRINE (PF) 0.5% -1:200000 IJ SOLN
1.8000 mL | Freq: Once | INTRAMUSCULAR | Status: AC
  Administered 2017-05-26: 1.8 mL
  Filled 2017-05-26: qty 1.8

## 2017-05-26 MED ORDER — CLINDAMYCIN HCL 150 MG PO CAPS: 300.0000 mg | ORAL_CAPSULE | Freq: Three times a day (TID) | ORAL | 0 refills | Status: DC

## 2017-05-26 NOTE — ED Provider Notes (Signed)
[facility] DEPT Provider Note   CSN: [id number] Arrival date & time: 05/25/17  2336     History   Chief Complaint Chief Complaint  Patient presents with  . Dental Pain    HPI Jasmine Heath is a 46 y.o. female.  Patient presents to the emergency department with a dental complaint. Symptoms began more than 1 week ago. The patient has tried to alleviate pain with Vicodin.  Pain rated at a 10/10, characterized as throbbing in nature and located right lower premolar. Patient denies fever, night sweats, chills, difficulty swallowing or opening mouth, SOB, nuchal rigidity or decreased ROM of neck.  Patient has follow-up with a dentist on Thursday.    The history is provided by the patient. No language interpreter was used.    Past Medical History:  Diagnosis Date  . Asthma     Patient Active Problem List   Diagnosis Date Noted  . Diabetes mellitus ([place]) 11/17/2012  . Anemia 11/15/2012  . Pain due to dental caries 11/15/2012  . Status asthmaticus 11/14/2012  . SOB (shortness of breath) 11/14/2012  . Morbid obesity ([place]) 11/14/2012  . Hypokalemia 11/14/2012    Past Surgical History:  Procedure Laterality Date  . FOOT SURGERY     I+D    OB History    No data available       Home Medications    Prior to Admission medications   Medication Sig Start Date End Date Taking? Authorizing Provider  albuterol (PROVENTIL HFA;VENTOLIN HFA) 108 (90 BASE) MCG/ACT inhaler Inhale 1-2 puffs into the lungs every 6 (six) hours as needed for wheezing or shortness of breath. 11/17/12   Hongalgi, Lenis Dickinson, MD  albuterol (PROVENTIL HFA;VENTOLIN HFA) 108 (90 Base) MCG/ACT inhaler Inhale 2 puffs into the lungs every 4 (four) hours as needed for wheezing or shortness of breath. 05/06/16   Kirichenko, Lahoma Rocker, PA-C  HYDROcodone-acetaminophen (NORCO/VICODIN) 5-325 MG tablet Take 1 tablet by mouth 4 (four) times daily as needed. 05/12/17   Sherlene Shams, MD  omeprazole (PRILOSEC) 20 MG  capsule Take 1 capsule (20 mg total) by mouth daily. 02/20/17   Robyn Haber, MD  penicillin v potassium (VEETID) 500 MG tablet Take 1 tablet (500 mg total) by mouth 2 (two) times daily. 05/12/17   Sherlene Shams, MD  polyethylene glycol powder [facility]) powder Take 255 g by mouth 2 (two) times daily. 02/20/17   Robyn Haber, MD    Family History Family History  Problem Relation Age of Onset  . Asthma Mother     Social History Social History  Substance Use Topics  . Smoking status: Current Every Day Smoker    Packs/day: 0.25    Types: Cigarettes  . Smokeless tobacco: Never Used  . Alcohol use No     Comment: occ beer and liquor     Allergies   Patient has no known allergies.   Review of Systems Review of Systems  Constitutional: Negative for chills and fever.  HENT: Positive for dental problem. Negative for drooling.   Neurological: Negative for speech difficulty.  Psychiatric/Behavioral: Positive for sleep disturbance.     Physical Exam Updated Vital Signs BP (!) 144/98 (BP Location: Left Wrist)   Pulse (!) 104   Temp 98.8 F (37.1 C) (Oral)   LMP 05/18/2017 (Approximate)   SpO2 96%   Physical Exam Physical Exam  Constitutional: Pt appears well-developed and well-nourished.  HENT:  Head: Normocephalic.  Right Ear: Tympanic membrane, external ear and ear canal normal.  Left  Ear: Tympanic membrane, external ear and ear canal normal.  Nose: Nose normal. Right sinus exhibits no maxillary sinus tenderness and no frontal sinus tenderness. Left sinus exhibits no maxillary sinus tenderness and no frontal sinus tenderness.  Mouth/Throat: Uvula is midline, oropharynx is clear and moist and mucous membranes are normal. No oral lesions. No uvula swelling or lacerations. No oropharyngeal exudate, posterior oropharyngeal edema, posterior oropharyngeal erythema or tonsillar abscesses.  Poor dentition No gingival swelling, fluctuance or induration No gross abscess  No  sublingual edema, tenderness to palpation, or sign of Ludwig's angina, or deep space infection Pain at right lower premolar Eyes: Conjunctivae are normal. Pupils are equal, round, and reactive to light. Right eye exhibits no discharge. Left eye exhibits no discharge.  Neck: Normal range of motion. Neck supple.  No stridor Handling secretions without difficulty No nuchal rigidity No cervical lymphadenopathy Cardiovascular: Normal rate, regular rhythm and normal heart sounds.   Pulmonary/Chest: Effort normal. No respiratory distress.  Equal chest rise  Abdominal: Soft. Bowel sounds are normal. Pt exhibits no distension. There is no tenderness.  Lymphadenopathy: Pt has no cervical adenopathy.  Neurological: Pt is alert and oriented x 4  Skin: Skin is warm and dry.  Psychiatric: Pt has a normal mood and affect.  Nursing note and vitals reviewed.    ED Treatments / Results  Labs (all labs ordered are listed, but only abnormal results are displayed) Labs Reviewed - No data to display  EKG  EKG Interpretation None       Radiology No results found.  Procedures Dental Block Date/Time: 05/26/2017 1:36 AM Performed by: Montine Circle Authorized by: Montine Circle   Consent:    Consent obtained:  Verbal   Consent given by:  Patient   Risks discussed:  Unsuccessful block, pain and infection   Alternatives discussed:  No treatment Indications:    Indications: dental pain   Location:    Block type:  Inferior alveolar   Laterality:  Right Procedure details (see MAR for exact dosages):    Topical anesthetic:  Benzocaine gel   Needle gauge:  27 G   Anesthetic injected:  Bupivacaine 0.5% WITH epi   Injection procedure:  Anatomic landmarks identified, introduced needle, incremental injection and anatomic landmarks palpated Post-procedure details:    Outcome:  Pain improved   Patient tolerance of procedure:  Tolerated well, no immediate complications   (including critical  care time)  Medications Ordered in ED Medications  bupivacaine-epinephrine (MARCAINE W/ EPI) 0.5% -1:200000 injection 1.8 mL (not administered)     Initial Impression / Assessment and Plan / ED Course  I have reviewed the triage vital signs and the nursing notes.  Pertinent labs & imaging results that were available during my care of the patient were reviewed by me and considered in my medical decision making (see chart for details).     Patient with dentalgia.  No abscess requiring immediate incision and drainage.  Exam not concerning for Ludwig's angina or pharyngeal abscess.  Will treat with clindamycin. Pt instructed to follow-up with dentist.  Discussed return precautions. Pt safe for discharge.   Final Clinical Impressions(s) / ED Diagnoses   Final diagnoses:  Pain, dental    New Prescriptions New Prescriptions   CLINDAMYCIN (CLEOCIN) 150 MG CAPSULE    Take 2 capsules (300 mg total) by mouth 3 (three) times daily. May dispense as 150mg  capsules     Montine Circle, PA-C 05/26/17 0138    Ripley Fraise, MD 05/26/17 [id number]

## 2017-09-27 ENCOUNTER — Encounter (HOSPITAL_COMMUNITY): Payer: Self-pay | Admitting: *Deleted

## 2017-09-27 ENCOUNTER — Ambulatory Visit (HOSPITAL_COMMUNITY)
Admission: EM | Admit: 2017-09-27 | Discharge: 2017-09-27 | Disposition: A | Discharge status: Home / Self Care | Payer: BLUE CROSS/BLUE SHIELD | Attending: Family Medicine | Admitting: Family Medicine

## 2017-09-27 DIAGNOSIS — E119 Type 2 diabetes mellitus without complications: Secondary | ICD-10-CM | POA: Diagnosis not present

## 2017-09-27 DIAGNOSIS — K5900 Constipation, unspecified: Secondary | ICD-10-CM

## 2017-09-27 DIAGNOSIS — M545 Low back pain, unspecified: Secondary | ICD-10-CM

## 2017-09-27 DIAGNOSIS — Z3202 Encounter for pregnancy test, result negative: Secondary | ICD-10-CM

## 2017-09-27 DIAGNOSIS — R1084 Generalized abdominal pain: Secondary | ICD-10-CM

## 2017-09-27 LAB — POCT URINALYSIS DIP (DEVICE)
BILIRUBIN URINE: NEGATIVE
GLUCOSE, UA: NEGATIVE mg/dL
Hgb urine dipstick: NEGATIVE
KETONES UR: NEGATIVE mg/dL
LEUKOCYTES UA: NEGATIVE
Nitrite: NEGATIVE
Protein, ur: NEGATIVE mg/dL
Specific Gravity, Urine: 1.015 (ref 1.005–1.030)
Urobilinogen, UA: 1 mg/dL (ref 0.0–1.0)
pH: 7 (ref 5.0–8.0)

## 2017-09-27 LAB — GLUCOSE, CAPILLARY: GLUCOSE-CAPILLARY: 149 mg/dL — AB (ref 65–99)

## 2017-09-27 LAB — POCT PREGNANCY, URINE: Preg Test, Ur: NEGATIVE

## 2017-09-27 MED ORDER — POLYETHYLENE GLYCOL 3350 17 G PO PACK: 17.0000 g | PACK | Freq: Every day | ORAL | 0 refills | Status: DC

## 2017-09-27 MED ORDER — NAPROXEN 500 MG PO TABS: 500.0000 mg | ORAL_TABLET | Freq: Two times a day (BID) | ORAL | 0 refills | Status: AC

## 2017-09-27 MED ORDER — DOCUSATE SODIUM 50 MG PO CAPS: 50.0000 mg | ORAL_CAPSULE | Freq: Two times a day (BID) | ORAL | 0 refills | Status: DC

## 2017-09-27 MED ORDER — KETOROLAC TROMETHAMINE 30 MG/ML IJ SOLN
INTRAMUSCULAR | Status: AC
  Filled 2017-09-27: qty 1

## 2017-09-27 MED ORDER — KETOROLAC TROMETHAMINE 30 MG/ML IJ SOLN
30.0000 mg | Freq: Once | INTRAMUSCULAR | Status: AC
  Administered 2017-09-27: 30 mg via INTRAMUSCULAR

## 2017-09-27 MED ORDER — OMEPRAZOLE 20 MG PO CPDR: 20.0000 mg | DELAYED_RELEASE_CAPSULE | Freq: Every day | ORAL | 0 refills | Status: DC

## 2017-09-27 NOTE — ED Provider Notes (Signed)
[facility]    CSN: [id number] Arrival date & time: 09/27/17  1249     History   Chief Complaint Chief Complaint  Patient presents with  . Abdominal Pain  . Back Pain    HPI Jasmine Heath is a 46 y.o. female.   46 year old female with history of asthma, DM comes in for 1 week history of abdominal pain. She started having mid low back pain x 5 days ago. Pain is right upper and lower quadrant as well as left lower quadrant. States movement makes pain worse. She has also noticed fatty food makes the pain worse. States laying down makes her pain better. Passing gas also makes symptoms better. She does continue to pass gas. Denies nausea, vomiting, diarrhea. Last BM 2 days ago with straining. Denies urinary symptoms such as frequency, dysuria, hematuria. Denies vaginal symptoms such as discharge, itching/pain, spotting. Denies fever, chills, night sweats. LMP 09/19/2017, not currently sexually active.   States DM is borderline, no blood glucose check. Last had something to eat prior to arrival.        Past Medical History:  Diagnosis Date  . Asthma     Patient Active Problem List   Diagnosis Date Noted  . Diabetes mellitus ([place]) 11/17/2012  . Anemia 11/15/2012  . Pain due to dental caries 11/15/2012  . Status asthmaticus 11/14/2012  . SOB (shortness of breath) 11/14/2012  . Morbid obesity ([place]) 11/14/2012  . Hypokalemia 11/14/2012    Past Surgical History:  Procedure Laterality Date  . FOOT SURGERY     I+D    OB History    No data available       Home Medications    Prior to Admission medications   Medication Sig Start Date End Date Taking? Authorizing Provider  albuterol (PROVENTIL HFA;VENTOLIN HFA) 108 (90 BASE) MCG/ACT inhaler Inhale 1-2 puffs into the lungs every 6 (six) hours as needed for wheezing or shortness of breath. 11/17/12  Yes Hongalgi, Lenis Dickinson, MD  docusate sodium (COLACE) 50 MG capsule Take 1 capsule (50 mg total) by mouth 2 (two)  times daily. 09/27/17   Tasia Catchings, Amy V, PA-C  naproxen (NAPROSYN) 500 MG tablet Take 1 tablet (500 mg total) by mouth 2 (two) times daily. 09/27/17 10/07/17  Ok Edwards, PA-C  omeprazole (PRILOSEC) 20 MG capsule Take 1 capsule (20 mg total) by mouth daily. 09/27/17   Tasia Catchings, Amy V, PA-C  polyethylene glycol (MIRALAX) packet Take 17 g by mouth daily. 09/27/17   Ok Edwards, PA-C    Family History Family History  Problem Relation Age of Onset  . Asthma Mother     Social History Social History  Substance Use Topics  . Smoking status: Current Every Day Smoker    Packs/day: 0.25    Types: Cigarettes  . Smokeless tobacco: Never Used  . Alcohol use Yes     Comment: occasionally     Allergies   Patient has no known allergies.   Review of Systems Review of Systems  Reason unable to perform ROS: See HPI as above.     Physical Exam Triage Vital Signs ED Triage Vitals  Enc Vitals Group     BP 09/27/17 1340 (!) 123/49     Pulse Rate 09/27/17 1340 93     Resp 09/27/17 1340 (!) 22     Temp 09/27/17 1340 98.3 F (36.8 C)     Temp Source 09/27/17 1340 Oral     SpO2 09/27/17 1340 99 %  Weight --      Height --      Head Circumference --      Peak Flow --      Pain Score 09/27/17 1342 9     Pain Loc --      Pain Edu? --      Excl. in [place]? --    No data found.   Updated Vital Signs BP (!) 123/49   Pulse 93   Temp 98.3 F (36.8 C) (Oral)   Resp (!) 22   LMP 09/19/2017 (Exact Date)   SpO2 99%   Physical Exam  Constitutional: She is oriented to person, place, and time. She appears well-developed and well-nourished. No distress.  HENT:  Head: Normocephalic and atraumatic.  Eyes: Pupils are equal, round, and reactive to light. Conjunctivae are normal.  Cardiovascular: Normal rate, regular rhythm and normal heart sounds.  Exam reveals no gallop and no friction rub.   No murmur heard. Pulmonary/Chest: Effort normal and breath sounds normal. She has no wheezes. She has no rales.    Abdominal: Soft. Bowel sounds are normal. There is no CVA tenderness.  Difficult exam due to body habitus. Generalized tenderness on palpation. No guarding, rebound. Negative murphy's sign, rovsing, mcburney's point, psoas, obturator.  Musculoskeletal:  Tenderness on palpation along lumber midline/paraspinal muscles. Full ROM of back. Hip ROM hard to assess due to low back pain. Negative straight leg raise.   Neurological: She is alert and oriented to person, place, and time.  Skin: Skin is warm and dry.  Psychiatric: She has a normal mood and affect. Her behavior is normal. Judgment normal.     UC Treatments / Results  Labs (all labs ordered are listed, but only abnormal results are displayed) Labs Reviewed  GLUCOSE, CAPILLARY - Abnormal; Notable for the following:       Result Value   Glucose-Capillary 149 (*)    All other components within normal limits  POCT URINALYSIS DIP (DEVICE)  POCT PREGNANCY, URINE    EKG  EKG Interpretation None       Radiology No results found.  Procedures Procedures (including critical care time)  Medications Ordered in UC Medications  ketorolac (TORADOL) 30 MG/ML injection 30 mg (30 mg Intramuscular Given 09/27/17 1420)     Initial Impression / Assessment and Plan / UC Course  I have reviewed the triage vital signs and the nursing notes.  Pertinent labs & imaging results that were available during my care of the patient were reviewed by me and considered in my medical decision making (see chart for details).    Discussed possible causes of abdominal pain, such as constipation, acid reflux, gall bladder problems. No alarming signs on exam, no murphy's sign, fever, nausea, vomiting. Discussed with patient will treat for constipation and acid reflux. Patient to follow up with PCP for further workup of possible gall bladder causes of symptoms. Return precautions given.   Toradol injection for back pain. Naproxen as directed. Patient to  follow up with PCP if symptoms do not improve. Return precautions given.   Final Clinical Impressions(s) / UC Diagnoses   Final diagnoses:  Generalized abdominal pain  Constipation, unspecified constipation type  Acute midline low back pain without sciatica    New Prescriptions Discharge Medication List as of 09/27/2017  2:14 PM    START taking these medications   Details  docusate sodium (COLACE) 50 MG capsule Take 1 capsule (50 mg total) by mouth 2 (two) times daily., Starting Sat 09/27/2017, Normal  naproxen (NAPROSYN) 500 MG tablet Take 1 tablet (500 mg total) by mouth 2 (two) times daily., Starting Sat 09/27/2017, Until Tue 10/07/2017, Normal    polyethylene glycol (MIRALAX) packet Take 17 g by mouth daily., Starting Sat 09/27/2017, Normal          Ok Edwards, [place] 09/27/17 1641

## 2017-09-27 NOTE — Discharge Instructions (Signed)
Toradol injection today for back pain. You can take naproxen as directed. Ice/heat compress as needed. Y Follow up here or with PCP if symptoms worsen, changes for reevaluation. If experience numbness/tingling of the inner thighs, loss of bladder or bowel control, go to the emergency department for evaluation.   No alarming signs on abdominal exam. Symptoms could be due to constipation, gall bladder problems, acid reflux. Start omeprazole for acid reflux. Colace and miralax for constipation. Keep hydrated, your urine should be clear to pale yellow in color. Follow up with PCP for ultrasound to rule out gall bladder causes of your symptoms. If experiencing worsening symptoms, nausea, vomiting, fever, go to the emergency department for further evaluation.

## 2017-09-27 NOTE — ED Triage Notes (Signed)
C/O constant RLQ abd pain x 1 wk; started with mid low back pain x 5 days.  Denies n/v/d.  Denies dysuria.  Denies vaginal discharge.  Denies fevers.

## 2017-10-20 ENCOUNTER — Encounter (HOSPITAL_COMMUNITY): Payer: Self-pay | Admitting: *Deleted

## 2017-10-20 ENCOUNTER — Other Ambulatory Visit: Payer: Self-pay

## 2017-10-20 ENCOUNTER — Emergency Department (HOSPITAL_COMMUNITY)
Admission: EM | Admit: 2017-10-20 | Discharge: 2017-10-20 | Discharge status: Against Medical Advice | Payer: BLUE CROSS/BLUE SHIELD | Attending: Emergency Medicine | Admitting: Emergency Medicine

## 2017-10-20 DIAGNOSIS — R109 Unspecified abdominal pain: Secondary | ICD-10-CM | POA: Diagnosis not present

## 2017-10-20 DIAGNOSIS — Z5321 Procedure and treatment not carried out due to patient leaving prior to being seen by health care provider: Secondary | ICD-10-CM | POA: Diagnosis not present

## 2017-10-20 LAB — CBC
HEMATOCRIT: 36.3 % (ref 36.0–46.0)
HEMOGLOBIN: 11.6 g/dL — AB (ref 12.0–15.0)
MCH: 28.4 pg (ref 26.0–34.0)
MCHC: 32 g/dL (ref 30.0–36.0)
MCV: 88.8 fL (ref 78.0–100.0)
Platelets: 284 10*3/uL (ref 150–400)
RBC: 4.09 MIL/uL (ref 3.87–5.11)
RDW: 14.1 % (ref 11.5–15.5)
WBC: 8 10*3/uL (ref 4.0–10.5)

## 2017-10-20 LAB — COMPREHENSIVE METABOLIC PANEL
ALBUMIN: 3.5 g/dL (ref 3.5–5.0)
ALK PHOS: 66 U/L (ref 38–126)
ALT: 16 U/L (ref 14–54)
ANION GAP: 5 (ref 5–15)
AST: 16 U/L (ref 15–41)
BILIRUBIN TOTAL: 0.5 mg/dL (ref 0.3–1.2)
BUN: 8 mg/dL (ref 6–20)
CALCIUM: 9.2 mg/dL (ref 8.9–10.3)
CO2: 25 mmol/L (ref 22–32)
Chloride: 106 mmol/L (ref 101–111)
Creatinine, Ser: 0.75 mg/dL (ref 0.44–1.00)
GFR calc non Af Amer: >60 mL/min (ref >60)
GLUCOSE: 146 mg/dL — AB (ref 65–99)
POTASSIUM: 4.3 mmol/L (ref 3.5–5.1)
SODIUM: 136 mmol/L (ref 135–145)
TOTAL PROTEIN: 6.6 g/dL (ref 6.5–8.1)

## 2017-10-20 LAB — URINALYSIS, ROUTINE W REFLEX MICROSCOPIC
Bilirubin Urine: NEGATIVE
Glucose, UA: NEGATIVE mg/dL
Hgb urine dipstick: NEGATIVE
Ketones, ur: NEGATIVE mg/dL
LEUKOCYTES UA: NEGATIVE
NITRITE: NEGATIVE
PH: 5 (ref 5.0–8.0)
Protein, ur: NEGATIVE mg/dL
SPECIFIC GRAVITY, URINE: 1.018 (ref 1.005–1.030)

## 2017-10-20 LAB — POC URINE PREG, ED: PREG TEST UR: NEGATIVE

## 2017-10-20 LAB — LIPASE, BLOOD: Lipase: 24 U/L (ref 11–51)

## 2017-10-20 NOTE — ED Notes (Signed)
Called x 3 NO answer 

## 2017-10-20 NOTE — ED Triage Notes (Signed)
C/o abd. Pain onset 2 months ago and the pain is getting worse.

## 2017-10-21 ENCOUNTER — Emergency Department (HOSPITAL_COMMUNITY)
Admission: EM | Admit: 2017-10-21 | Discharge: 2017-10-21 | Discharge status: Against Medical Advice | Payer: BLUE CROSS/BLUE SHIELD | Attending: Emergency Medicine | Admitting: Emergency Medicine

## 2017-10-21 ENCOUNTER — Encounter (HOSPITAL_COMMUNITY): Payer: Self-pay | Admitting: Family Medicine

## 2017-10-21 DIAGNOSIS — Z5321 Procedure and treatment not carried out due to patient leaving prior to being seen by health care provider: Secondary | ICD-10-CM | POA: Insufficient documentation

## 2017-10-21 NOTE — ED Triage Notes (Signed)
Patient is complaining of right upper quad pain that started 2 months ago but getting worse. Patient has tried OTC acid reflex and prescription medication from Urgent Care with no relief. Reports nausea and vomiting but denies diarrhea or fever.

## 2017-10-21 NOTE — ED Notes (Signed)
Patient would like to hold on performing blood work and urinalysis due to having test performed yesterday at [facility] ED.

## 2017-10-21 NOTE — ED Notes (Signed)
Registration states that the patient has been gone for several minutes

## 2018-03-29 ENCOUNTER — Emergency Department (HOSPITAL_COMMUNITY)
Admission: EM | Admit: 2018-03-29 | Discharge: 2018-03-30 | Disposition: A | Discharge status: Home / Self Care | Payer: Self-pay | Attending: Emergency Medicine | Admitting: Emergency Medicine

## 2018-03-29 ENCOUNTER — Other Ambulatory Visit: Payer: Self-pay

## 2018-03-29 ENCOUNTER — Encounter (HOSPITAL_COMMUNITY): Payer: Self-pay | Admitting: Emergency Medicine

## 2018-03-29 ENCOUNTER — Emergency Department (HOSPITAL_COMMUNITY): Payer: Self-pay

## 2018-03-29 DIAGNOSIS — F17210 Nicotine dependence, cigarettes, uncomplicated: Secondary | ICD-10-CM | POA: Insufficient documentation

## 2018-03-29 DIAGNOSIS — J4521 Mild intermittent asthma with (acute) exacerbation: Secondary | ICD-10-CM | POA: Insufficient documentation

## 2018-03-29 DIAGNOSIS — R062 Wheezing: Secondary | ICD-10-CM

## 2018-03-29 DIAGNOSIS — E119 Type 2 diabetes mellitus without complications: Secondary | ICD-10-CM | POA: Insufficient documentation

## 2018-03-29 MED ORDER — IPRATROPIUM-ALBUTEROL 0.5-2.5 (3) MG/3ML IN SOLN
3.0000 mL | Freq: Once | RESPIRATORY_TRACT | Status: AC
  Administered 2018-03-29: 3 mL via RESPIRATORY_TRACT
  Filled 2018-03-29: qty 3

## 2018-03-29 MED ORDER — PREDNISONE 20 MG PO TABS
60.0000 mg | ORAL_TABLET | Freq: Once | ORAL | Status: AC
  Administered 2018-03-29: 60 mg via ORAL
  Filled 2018-03-29: qty 3

## 2018-03-29 MED ORDER — METHYLPREDNISOLONE SODIUM SUCC 125 MG IJ SOLR: 125.0000 mg | Freq: Once | INTRAMUSCULAR | Status: DC

## 2018-03-29 MED ORDER — PREDNISONE 20 MG PO TABS: 40.0000 mg | ORAL_TABLET | Freq: Every day | ORAL | 0 refills | Status: DC

## 2018-03-29 MED ORDER — ALBUTEROL SULFATE HFA 108 (90 BASE) MCG/ACT IN AERS: 1.0000 | INHALATION_SPRAY | Freq: Four times a day (QID) | RESPIRATORY_TRACT | 0 refills | Status: DC | PRN

## 2018-03-29 NOTE — ED Triage Notes (Signed)
Pt is c/o asthma flare up  Pt states she is out of her medications

## 2018-03-29 NOTE — Discharge Instructions (Addendum)
It was my pleasure taking care of you today!  ° °Take prednisone daily starting tomorrow morning.  °I have given you a refill of your home inhaler.  ° °Follow up with your primary care provider for further discussion of today's ER visit.  ° °If you develop any new or worsening symptoms, including but not limited to fever, persistent vomiting, worsening shortness of breath or other symptoms that concern you, please return to the Emergency Department immediately. ° °

## 2018-03-29 NOTE — ED Provider Notes (Signed)
[facility] DEPT Provider Note   CSN: [id number] Arrival date & time: 03/29/18  2056     History   Chief Complaint Chief Complaint  Patient presents with  . Shortness of Breath    HPI Jasmine Heath is a 47 y.o. female.  The history is provided by the patient and medical records. No language interpreter was used.  Shortness of Breath  Associated symptoms include cough and wheezing. Pertinent negatives include no chest pain and no abdominal pain.     Jasmine Heath is a 47 y.o. female  with a PMH of asthma who presents to the Emergency Department complaining of sniffing, cough, congestion x2 days.  This morning, she noticed shortness of breath and wheezing consistent with her typical asthma exacerbation.  She has been using her inhalers over the last 2 days, but ran out and did not use it today.  She did use albuterol neb at home and did not have any relief.  She does feel as if this is consistent with her usual asthma exacerbation.  She has not had any fever or chills.  No chest pain.  Past Medical History:  Diagnosis Date  . Asthma     Patient Active Problem List   Diagnosis Date Noted  . Diabetes mellitus ([place]) 11/17/2012  . Anemia 11/15/2012  . Pain due to dental caries 11/15/2012  . Status asthmaticus 11/14/2012  . SOB (shortness of breath) 11/14/2012  . Morbid obesity ([place]) 11/14/2012  . Hypokalemia 11/14/2012    Past Surgical History:  Procedure Laterality Date  . FOOT SURGERY     I+D     OB History   None      Home Medications    Prior to Admission medications   Medication Sig Start Date End Date Taking? Authorizing Provider  Phenylephrine-DM (THERAFLU COLD/COUGH DAYTIME PO) Take 30 mLs by mouth daily as needed (cold symptoms).   Yes [provider]  albuterol (PROVENTIL HFA;VENTOLIN HFA) 108 (90 BASE) MCG/ACT inhaler Inhale 1-2 puffs into the lungs every 6 (six) hours as needed for wheezing or shortness of  breath. 11/17/12   Hongalgi, Lenis Dickinson, MD  docusate sodium (COLACE) 50 MG capsule Take 1 capsule (50 mg total) by mouth 2 (two) times daily. Patient not taking: Reported on 03/29/2018 09/27/17   Ok Edwards, PA-C  omeprazole (PRILOSEC) 20 MG capsule Take 1 capsule (20 mg total) by mouth daily. Patient not taking: Reported on 03/29/2018 09/27/17   Ok Edwards, PA-C  polyethylene glycol [facility]) packet Take 17 g by mouth daily. Patient not taking: Reported on 03/29/2018 09/27/17   Arturo Morton    Family History Family History  Problem Relation Age of Onset  . Asthma Mother     Social History Social History   Tobacco Use  . Smoking status: Current Every Day Smoker    Packs/day: 0.25    Types: Cigarettes  . Smokeless tobacco: Never Used  Substance Use Topics  . Alcohol use: Yes    Frequency: Never    Comment: Reports she drinks on holidays   . Drug use: No     Allergies   Patient has no known allergies.   Review of Systems Review of Systems  HENT: Positive for congestion.   Respiratory: Positive for cough, shortness of breath and wheezing.   Cardiovascular: Negative for chest pain.  Gastrointestinal: Negative for abdominal pain.  All other systems reviewed and are negative.    Physical Exam Updated Vital Signs BP  139/88 (BP Location: Left Arm)   Pulse 98   Temp 99.7 F (37.6 C) (Oral)   Resp 20   Ht 5\' 6"  (1.676 m)   Wt 127 kg (280 lb)   LMP 03/15/2018 (Approximate)   SpO2 96%   BMI 45.19 kg/m   Physical Exam  Constitutional: She is oriented to person, place, and time. She appears well-developed and well-nourished. No distress.  HENT:  Head: Normocephalic and atraumatic.  Mouth/Throat: Oropharynx is clear and moist.  Cardiovascular: Normal rate, regular rhythm and normal heart sounds.  No murmur heard. Pulmonary/Chest: Effort normal. No respiratory distress.  Expiratory wheezing bilaterally.  Speaking in full sentences without any difficulty.  Abdominal:  Soft. She exhibits no distension. There is no tenderness.  Neurological: She is alert and oriented to person, place, and time.  Skin: Skin is warm and dry.  Nursing note and vitals reviewed.    ED Treatments / Results  Labs (all labs ordered are listed, but only abnormal results are displayed) Labs Reviewed - No data to display  EKG None  Radiology Dg Chest 2 View  Result Date: 03/29/2018 CLINICAL DATA:  Dyspnea with congestive cough and central chest pain. EXAM: CHEST - 2 VIEW COMPARISON:  01/26/2016 FINDINGS: The heart size and mediastinal contours are within normal limits. Mild central Peri bronchial thickening and increased interstitial lung markings likely representing viral mediated bronchitic change. The visualized skeletal structures are unremarkable. IMPRESSION: Central bronchial thickening and increased interstitial lung markings about the hila consistent with bronchitic change. Electronically Signed   By: Ashley Royalty M.D.   On: 03/29/2018 22:31    Procedures Procedures (including critical care time)  Medications Ordered in ED Medications  ipratropium-albuterol (DUONEB) 0.5-2.5 (3) MG/3ML nebulizer solution 3 mL (has no administration in time range)  ipratropium-albuterol (DUONEB) 0.5-2.5 (3) MG/3ML nebulizer solution 3 mL (3 mLs Nebulization Given 03/29/18 2218)  predniSONE (DELTASONE) tablet 60 mg (60 mg Oral Given 03/29/18 2229)     Initial Impression / Assessment and Plan / ED Course  I have reviewed the triage vital signs and the nursing notes.  Pertinent labs & imaging results that were available during my care of the patient were reviewed by me and considered in my medical decision making (see chart for details).    Jasmine Heath is a 47 y.o. female who presents to ED for cough, congestion, wheezing and shortness of breath consistent with her typical asthma exacerbations.  X-ray with no pneumonia, but does show bronchitic changes.  On exam, patient is afebrile,  hemodynamically stable with expiratory wheezing bilaterally.  She is speaking in full sentences without difficulty, oxygenating well with no signs of respiratory distress.  Given DuoNeb and prednisone in the emergency department.  On reevaluation, she feels improved, but feels as if she needs 1 more neb treatment.  Additional neb given. Discharged home with prednisone burst, refill of albuterol. PCP follow up encouraged. Return precautions discussed. All questions answered.   Final Clinical Impressions(s) / ED Diagnoses   Final diagnoses:  Wheezing    ED Discharge Orders    None       Ioannis Schuh, Ozella Almond, PA-C 03/29/18 2316    Virgel Manifold, MD 03/30/18 [phone]

## 2019-01-05 ENCOUNTER — Encounter (HOSPITAL_COMMUNITY): Payer: Self-pay

## 2019-01-05 ENCOUNTER — Ambulatory Visit (HOSPITAL_COMMUNITY)
Admission: EM | Admit: 2019-01-05 | Discharge: 2019-01-05 | Disposition: A | Discharge status: Home / Self Care | Payer: Self-pay | Attending: Family Medicine | Admitting: Family Medicine

## 2019-01-05 DIAGNOSIS — K0889 Other specified disorders of teeth and supporting structures: Secondary | ICD-10-CM

## 2019-01-05 DIAGNOSIS — J069 Acute upper respiratory infection, unspecified: Secondary | ICD-10-CM

## 2019-01-05 DIAGNOSIS — B9789 Other viral agents as the cause of diseases classified elsewhere: Secondary | ICD-10-CM

## 2019-01-05 DIAGNOSIS — J4521 Mild intermittent asthma with (acute) exacerbation: Secondary | ICD-10-CM

## 2019-01-05 MED ORDER — BENZONATATE 200 MG PO CAPS: 200.0000 mg | ORAL_CAPSULE | Freq: Three times a day (TID) | ORAL | 0 refills | Status: DC | PRN

## 2019-01-05 MED ORDER — IPRATROPIUM-ALBUTEROL 0.5-2.5 (3) MG/3ML IN SOLN: 3.0000 mL | RESPIRATORY_TRACT | 0 refills | Status: DC | PRN

## 2019-01-05 MED ORDER — PREDNISONE 50 MG PO TABS: 50.0000 mg | ORAL_TABLET | Freq: Every day | ORAL | 0 refills | Status: AC

## 2019-01-05 MED ORDER — AMOXICILLIN-POT CLAVULANATE 875-125 MG PO TABS: 1.0000 | ORAL_TABLET | Freq: Two times a day (BID) | ORAL | 0 refills | Status: AC

## 2019-01-05 MED ORDER — AMOXICILLIN-POT CLAVULANATE 875-125 MG PO TABS: 1.0000 | ORAL_TABLET | Freq: Two times a day (BID) | ORAL | 0 refills | Status: DC

## 2019-01-05 MED ORDER — ALBUTEROL SULFATE HFA 108 (90 BASE) MCG/ACT IN AERS: 1.0000 | INHALATION_SPRAY | Freq: Four times a day (QID) | RESPIRATORY_TRACT | 0 refills | Status: DC | PRN

## 2019-01-05 MED ORDER — PREDNISONE 50 MG PO TABS: 50.0000 mg | ORAL_TABLET | Freq: Every day | ORAL | 0 refills | Status: DC

## 2019-01-05 MED ORDER — BENZONATATE 200 MG PO CAPS: 200.0000 mg | ORAL_CAPSULE | Freq: Three times a day (TID) | ORAL | 0 refills | Status: AC | PRN

## 2019-01-05 NOTE — Discharge Instructions (Signed)
I have refilled your albuterol inhaler, nebulizers-please continue to use as needed for shortness of breath, wheezing, chest tightness Please begin prednisone daily for the next 5 days, please take with food on your stomach For nasal congestion you may try Zyrtec/Claritin or Flonase or Mucinex For cough you may use the Tessalon or over-the-counter Delsym, Robitussin-DM Drink plenty of fluids Rest  For your dental pain you may begin taking Augmentin twice daily for the next week to cover for infection Use anti-inflammatories for pain/swelling. You may take up to 800 mg Ibuprofen every 8 hours with food. You may supplement Ibuprofen with Tylenol [phone] mg every 8 hours.   Please follow-up if symptoms worsening, developing increased shortness of breath, Difficulty breathing, fevers, facial swelling

## 2019-01-05 NOTE — ED Triage Notes (Signed)
Pt presents with persistent non productive cough with chest pain and shortness of breath X 3 days.

## 2019-01-06 NOTE — ED Provider Notes (Signed)
[name]    CSN: [id number] Arrival date & time: 01/05/19  1242     History   Chief Complaint Chief Complaint  Patient presents with  . Cough  . Chest Pain  . Shortness of Breath    HPI Jasmine Heath is a 48 y.o. female history of asthma, diabetes mellitus type 2 presenting today for evaluation of her asthma and cough.  Patient states that over the past 3 days she has had worsening shortness of breath, wheezing and chest tightness.  She has had cough associated with this.  She does admit to some mild rhinorrhea and sore throat as well.  Denies any fevers.  She states that she is running out of her albuterol.  She is also concerned about infection in her tooth.  States that she has had increased pain over the past week and tenderness to touch.  Notes that she does have this tooth pulled, but has not followed up with dentistry.  HPI  Past Medical History:  Diagnosis Date  . Asthma     Patient Active Problem List   Diagnosis Date Noted  . Diabetes mellitus ([place]) 11/17/2012  . Anemia 11/15/2012  . Pain due to dental caries 11/15/2012  . Status asthmaticus 11/14/2012  . SOB (shortness of breath) 11/14/2012  . Morbid obesity ([place]) 11/14/2012  . Hypokalemia 11/14/2012    Past Surgical History:  Procedure Laterality Date  . FOOT SURGERY     I+D    OB History   No obstetric history on file.      Home Medications    Prior to Admission medications   Medication Sig Start Date End Date Taking? Authorizing Provider  albuterol (PROVENTIL HFA;VENTOLIN HFA) 108 (90 Base) MCG/ACT inhaler Inhale 1-2 puffs into the lungs every 6 (six) hours as needed for wheezing or shortness of breath. 01/05/19   Wieters, Hallie C, PA-C  amoxicillin-clavulanate (AUGMENTIN) 875-125 MG tablet Take 1 tablet by mouth every 12 (twelve) hours for 7 days. 01/05/19 01/12/19  Wieters, Hallie C, PA-C  benzonatate (TESSALON) 200 MG capsule Take 1 capsule (200 mg total) by mouth 3 (three) times  daily as needed for up to 7 days for cough. 01/05/19 01/12/19  Wieters, Hallie C, PA-C  ipratropium-albuterol (DUONEB) 0.5-2.5 (3) MG/3ML SOLN Take 3 mLs by nebulization every 4 (four) hours as needed. 01/05/19   Wieters, Hallie C, PA-C  Phenylephrine-DM (THERAFLU COLD/COUGH DAYTIME PO) Take 30 mLs by mouth daily as needed (cold symptoms).    [provider]  predniSONE (DELTASONE) 50 MG tablet Take 1 tablet (50 mg total) by mouth daily for 5 days. 01/05/19 01/10/19  Wieters, Elesa Hacker, PA-C    Family History Family History  Problem Relation Age of Onset  . Asthma Mother     Social History Social History   Tobacco Use  . Smoking status: Current Every Day Smoker    Packs/day: 0.25    Types: Cigarettes  . Smokeless tobacco: Never Used  Substance Use Topics  . Alcohol use: Yes    Frequency: Never    Comment: Reports she drinks on holidays   . Drug use: No     Allergies   Patient has no known allergies.   Review of Systems Review of Systems  Constitutional: Negative for activity change, appetite change, chills, fatigue and fever.  HENT: Positive for congestion, dental problem, rhinorrhea and sore throat. Negative for ear pain, sinus pressure and trouble swallowing.   Eyes: Negative for discharge and redness.  Respiratory: Positive  for cough, chest tightness, shortness of breath and wheezing.   Cardiovascular: Negative for chest pain.  Gastrointestinal: Negative for abdominal pain, diarrhea, nausea and vomiting.  Musculoskeletal: Negative for myalgias.  Skin: Negative for rash.  Neurological: Negative for dizziness, light-headedness and headaches.     Physical Exam Triage Vital Signs ED Triage Vitals  Enc Vitals Group     BP 01/05/19 1409 123/82     Pulse Rate 01/05/19 1409 85     Resp 01/05/19 1409 17     Temp 01/05/19 1409 98.3 F (36.8 C)     Temp Source 01/05/19 1409 Oral     SpO2 01/05/19 1409 95 %     Weight --      Height --      Head Circumference --       Peak Flow --      Pain Score 01/05/19 1408 8     Pain Loc --      Pain Edu? --      Excl. in [place]? --    No data found.  Updated Vital Signs BP 123/82 (BP Location: Left Arm)   Pulse 85   Temp 98.3 F (36.8 C) (Oral)   Resp 17   LMP 12/11/2018   SpO2 95%   Visual Acuity Right Eye Distance:   Left Eye Distance:   Bilateral Distance:    Right Eye Near:   Left Eye Near:    Bilateral Near:     Physical Exam Vitals signs and nursing note reviewed.  Constitutional:      General: She is not in acute distress.    Appearance: She is well-developed.  HENT:     Head: Normocephalic and atraumatic.     Ears:     Comments: Bilateral ears without tenderness to palpation of external auricle, tragus and mastoid, EAC's without erythema or swelling, TM's with good bony landmarks and cone of light. Non erythematous.    Mouth/Throat:     Comments: Oral mucosa pink and moist, no tonsillar enlargement or exudate. Posterior pharynx patent and nonerythematous, no uvula deviation or swelling. Normal phonation.  Poor dentition, right lower jaw with tooth that with obvious decay, mild surrounding gingival erythema and tenderness, no soft palate swelling Eyes:     Conjunctiva/sclera: Conjunctivae normal.  Neck:     Musculoskeletal: Neck supple.  Cardiovascular:     Rate and Rhythm: Normal rate and regular rhythm.     Heart sounds: No murmur.  Pulmonary:     Effort: Pulmonary effort is normal. No respiratory distress.     Breath sounds: Wheezing present.     Comments: Faint expiratory wheezing in bilateral lung fields Abdominal:     Palpations: Abdomen is soft.     Tenderness: There is no abdominal tenderness.  Skin:    General: Skin is warm and dry.  Neurological:     Mental Status: She is alert.      UC Treatments / Results  Labs (all labs ordered are listed, but only abnormal results are displayed) Labs Reviewed - No data to display  EKG None  Radiology No results  found.  Procedures Procedures (including critical care time)  Medications Ordered in UC Medications - No data to display  Initial Impression / Assessment and Plan / UC Course  I have reviewed the triage vital signs and the nursing notes.  Pertinent labs & imaging results that were available during my care of the patient were reviewed by me and considered in my medical  decision making (see chart for details).     Patient with 3 days of URI symptoms likely exacerbating asthma, vital signs stable.  Will treat asthma with prednisone, refill of albuterol and nebulizers.  Recommended symptomatic management for URI symptoms, rest, push fluids.  Possible underlying dental infection triggering pain will initiate on Augmentin to treat for this.  Tylenol and ibuprofen for pain.  Continue to monitor symptoms,Discussed strict return precautions. Patient verbalized understanding and is agreeable with plan.  Final Clinical Impressions(s) / UC Diagnoses   Final diagnoses:  Viral URI with cough  Mild intermittent asthma with acute exacerbation  Pain, dental     Discharge Instructions     I have refilled your albuterol inhaler, nebulizers-please continue to use as needed for shortness of breath, wheezing, chest tightness Please begin prednisone daily for the next 5 days, please take with food on your stomach For nasal congestion you may try Zyrtec/Claritin or Flonase or Mucinex For cough you may use the Tessalon or over-the-counter Delsym, Robitussin-DM Drink plenty of fluids Rest  For your dental pain you may begin taking Augmentin twice daily for the next week to cover for infection Use anti-inflammatories for pain/swelling. You may take up to 800 mg Ibuprofen every 8 hours with food. You may supplement Ibuprofen with Tylenol [phone] mg every 8 hours.   Please follow-up if symptoms worsening, developing increased shortness of breath, Difficulty breathing, fevers, facial swelling   ED  Prescriptions    Medication Sig Dispense Auth. Provider   albuterol (PROVENTIL HFA;VENTOLIN HFA) 108 (90 Base) MCG/ACT inhaler  (Status: Discontinued) Inhale 1-2 puffs into the lungs every 6 (six) hours as needed for wheezing or shortness of breath. 1 Inhaler Wieters, Hallie C, PA-C   ipratropium-albuterol (DUONEB) 0.5-2.5 (3) MG/3ML SOLN  (Status: Discontinued) Take 3 mLs by nebulization every 4 (four) hours as needed. 360 mL Wieters, Hallie C, PA-C   predniSONE (DELTASONE) 50 MG tablet  (Status: Discontinued) Take 1 tablet (50 mg total) by mouth daily for 5 days. 5 tablet Wieters, Hallie C, PA-C   benzonatate (TESSALON) 200 MG capsule  (Status: Discontinued) Take 1 capsule (200 mg total) by mouth 3 (three) times daily as needed for up to 7 days for cough. 28 capsule Wieters, Hallie C, PA-C   amoxicillin-clavulanate (AUGMENTIN) 875-125 MG tablet  (Status: Discontinued) Take 1 tablet by mouth every 12 (twelve) hours for 7 days. 14 tablet Wieters, Hallie C, PA-C   predniSONE (DELTASONE) 50 MG tablet Take 1 tablet (50 mg total) by mouth daily for 5 days. 5 tablet Wieters, Hallie C, PA-C   albuterol (PROVENTIL HFA;VENTOLIN HFA) 108 (90 Base) MCG/ACT inhaler Inhale 1-2 puffs into the lungs every 6 (six) hours as needed for wheezing or shortness of breath. 1 Inhaler Wieters, Hallie C, PA-C   amoxicillin-clavulanate (AUGMENTIN) 875-125 MG tablet Take 1 tablet by mouth every 12 (twelve) hours for 7 days. 14 tablet Wieters, Hallie C, PA-C   benzonatate (TESSALON) 200 MG capsule Take 1 capsule (200 mg total) by mouth 3 (three) times daily as needed for up to 7 days for cough. 28 capsule Wieters, Hallie C, PA-C   ipratropium-albuterol (DUONEB) 0.5-2.5 (3) MG/3ML SOLN Take 3 mLs by nebulization every 4 (four) hours as needed. 360 mL Wieters, Hallie C, PA-C     Controlled Substance Prescriptions [place] Controlled Substance Registry consulted? Not Applicable   Janith Lima, [place] 01/06/19 1018

## 2019-06-16 ENCOUNTER — Encounter (HOSPITAL_COMMUNITY): Payer: Self-pay

## 2019-06-16 ENCOUNTER — Emergency Department (HOSPITAL_COMMUNITY)
Admission: EM | Admit: 2019-06-16 | Discharge: 2019-06-16 | Disposition: A | Discharge status: Home / Self Care | Payer: Self-pay | Attending: Emergency Medicine | Admitting: Emergency Medicine

## 2019-06-16 ENCOUNTER — Other Ambulatory Visit: Payer: Self-pay

## 2019-06-16 DIAGNOSIS — J45901 Unspecified asthma with (acute) exacerbation: Secondary | ICD-10-CM | POA: Insufficient documentation

## 2019-06-16 DIAGNOSIS — Z760 Encounter for issue of repeat prescription: Secondary | ICD-10-CM

## 2019-06-16 DIAGNOSIS — F17210 Nicotine dependence, cigarettes, uncomplicated: Secondary | ICD-10-CM | POA: Insufficient documentation

## 2019-06-16 DIAGNOSIS — E669 Obesity, unspecified: Secondary | ICD-10-CM | POA: Insufficient documentation

## 2019-06-16 DIAGNOSIS — E119 Type 2 diabetes mellitus without complications: Secondary | ICD-10-CM | POA: Insufficient documentation

## 2019-06-16 DIAGNOSIS — Z6841 Body Mass Index (BMI) 40.0 and over, adult: Secondary | ICD-10-CM | POA: Insufficient documentation

## 2019-06-16 MED ORDER — ALBUTEROL SULFATE HFA 108 (90 BASE) MCG/ACT IN AERS
1.0000 | INHALATION_SPRAY | Freq: Four times a day (QID) | RESPIRATORY_TRACT | 0 refills | Status: DC | PRN
Start: 2019-06-16 — End: 2020-05-30

## 2019-06-16 MED ORDER — PREDNISONE 20 MG PO TABS: 60.0000 mg | ORAL_TABLET | Freq: Every day | ORAL | 0 refills | Status: DC

## 2019-06-16 MED ORDER — ALBUTEROL SULFATE HFA 108 (90 BASE) MCG/ACT IN AERS
2.0000 | INHALATION_SPRAY | Freq: Once | RESPIRATORY_TRACT | Status: AC
  Administered 2019-06-16: 2 via RESPIRATORY_TRACT
  Filled 2019-06-16: qty 6.7

## 2019-06-16 NOTE — ED Provider Notes (Signed)
[facility] EMERGENCY DEPARTMENT Provider Note   CSN: [id number] Arrival date & time: 06/16/19  2050     History   Chief Complaint Chief Complaint  Patient presents with   Asthma   Medication Refill    HPI Jasmine Heath is a 48 y.o. female.     47 year old female with a history of diabetes, morbid obesity, asthma presents to the emergency department for complaints of shortness of breath and chest tightness.  Reports that her symptoms began this morning.  They were aggravated when at work as there is no air conditioning at her job and the air was hot and thick.  Denies any known sick contacts, fever, loss of smell or taste, congestion.  Presently denying feeling SOB.  Usually uses her inhaler for symptom management, but ran out last week.  The history is provided by the patient. No language interpreter was used.  Asthma  Medication Refill   Past Medical History:  Diagnosis Date   Asthma     Patient Active Problem List   Diagnosis Date Noted   Diabetes mellitus ([place]) 11/17/2012   Anemia 11/15/2012   Pain due to dental caries 11/15/2012   Status asthmaticus 11/14/2012   SOB (shortness of breath) 11/14/2012   Morbid obesity ([place]) 11/14/2012   Hypokalemia 11/14/2012    Past Surgical History:  Procedure Laterality Date   FOOT SURGERY     I+D     OB History   No obstetric history on file.      Home Medications    Prior to Admission medications   Medication Sig Start Date End Date Taking? Authorizing Provider  albuterol (VENTOLIN HFA) 108 (90 Base) MCG/ACT inhaler Inhale 1-2 puffs into the lungs every 6 (six) hours as needed for wheezing or shortness of breath. 06/16/19   Antonietta Breach, PA-C  ipratropium-albuterol (DUONEB) 0.5-2.5 (3) MG/3ML SOLN Take 3 mLs by nebulization every 4 (four) hours as needed. 01/05/19   Wieters, Hallie C, PA-C  Phenylephrine-DM (THERAFLU COLD/COUGH DAYTIME PO) Take 30 mLs by mouth daily as needed (cold  symptoms).    [provider]  predniSONE (DELTASONE) 20 MG tablet Take 3 tablets (60 mg total) by mouth daily. 06/16/19   Antonietta Breach, PA-C    Family History Family History  Problem Relation Age of Onset   Asthma Mother     Social History Social History   Tobacco Use   Smoking status: Current Every Day Smoker    Packs/day: 0.25    Types: Cigarettes   Smokeless tobacco: Never Used  Substance Use Topics   Alcohol use: Yes    Frequency: Never    Comment: Reports she drinks on holidays    Drug use: No     Allergies   Patient has no known allergies.   Review of Systems Review of Systems Ten systems reviewed and are negative for acute change, except as noted in the HPI.    Physical Exam Updated Vital Signs BP (!) 141/79 (BP Location: Right Arm)    Pulse 81    Temp 98.8 F (37.1 C) (Oral)    Resp 16    Ht 5\' 6"  (1.676 m)    Wt 122.5 kg    SpO2 97%    BMI 43.58 kg/m   Physical Exam Vitals signs and nursing note reviewed.  Constitutional:      General: She is not in acute distress.    Appearance: She is well-developed. She is not diaphoretic.     Comments: Nontoxic  appearing and in NAD. Morbidly obese.  HENT:     Head: Normocephalic and atraumatic.  Eyes:     General: No scleral icterus.    Conjunctiva/sclera: Conjunctivae normal.  Neck:     Musculoskeletal: Normal range of motion.  Cardiovascular:     Rate and Rhythm: Normal rate and regular rhythm.     Pulses: Normal pulses.  Pulmonary:     Effort: Pulmonary effort is normal. No respiratory distress.     Comments: Faint scattered expiratory wheeze; minimal. Moving air well. No tachypnea or dyspnea. No rales or rhonchi. Chest expansion symmetric. Musculoskeletal: Normal range of motion.  Skin:    General: Skin is warm and dry.     Coloration: Skin is not pale.     Findings: No erythema or rash.  Neurological:     General: No focal deficit present.     Mental Status: She is alert and oriented  to person, place, and time.     Coordination: Coordination normal.     Comments: GCS 15. Patient moving all extremities spontaneously.  Psychiatric:        Behavior: Behavior normal.      ED Treatments / Results  Labs (all labs ordered are listed, but only abnormal results are displayed) Labs Reviewed - No data to display  EKG None  Radiology No results found.  Procedures Procedures (including critical care time)  Medications Ordered in ED Medications  albuterol (VENTOLIN HFA) 108 (90 Base) MCG/ACT inhaler 2 puff (has no administration in time range)     Initial Impression / Assessment and Plan / ED Course  I have reviewed the triage vital signs and the nursing notes.  Pertinent labs & imaging results that were available during my care of the patient were reviewed by me and considered in my medical decision making (see chart for details).        Patient presenting for asthma exacerbation which she attributes to working in an un-air conditioned environment.  Presently has no complaints of shortness of breath or chest tightness.  Lungs sounds reassuring.  No hypoxia.  States that she ran out of her albuterol inhaler recently.  She was given an additional inhaler in the ED.  Will discharge with 5-day course of prednisone. Patient states she is breathing at baseline. Encouraged PCP follow up. Return precautions discussed and provided. Patient discharged in stable condition with no unaddressed concerns.   Final Clinical Impressions(s) / ED Diagnoses   Final diagnoses:  Mild asthma with exacerbation, unspecified whether persistent  Encounter for medication refill    ED Discharge Orders         Ordered    albuterol (VENTOLIN HFA) 108 (90 Base) MCG/ACT inhaler  Every 6 hours PRN     06/16/19 2309    predniSONE (DELTASONE) 20 MG tablet  Daily     06/16/19 2309           Antonietta Breach, PA-C 06/16/19 2316    Drenda Freeze, MD 06/25/19 1218

## 2019-06-16 NOTE — ED Triage Notes (Signed)
Pt states that she ran out of her inhaler today and has been feeling SOB with chest tightness

## 2019-08-25 ENCOUNTER — Emergency Department (HOSPITAL_COMMUNITY)
Admission: EM | Admit: 2019-08-25 | Discharge: 2019-08-25 | Disposition: A | Discharge status: Home / Self Care | Payer: BC Managed Care – PPO | Attending: Emergency Medicine | Admitting: Emergency Medicine

## 2019-08-25 ENCOUNTER — Other Ambulatory Visit: Payer: Self-pay

## 2019-08-25 ENCOUNTER — Encounter (HOSPITAL_COMMUNITY): Payer: Self-pay

## 2019-08-25 DIAGNOSIS — E119 Type 2 diabetes mellitus without complications: Secondary | ICD-10-CM | POA: Diagnosis not present

## 2019-08-25 DIAGNOSIS — J45901 Unspecified asthma with (acute) exacerbation: Secondary | ICD-10-CM | POA: Insufficient documentation

## 2019-08-25 DIAGNOSIS — R062 Wheezing: Secondary | ICD-10-CM | POA: Diagnosis present

## 2019-08-25 DIAGNOSIS — F17210 Nicotine dependence, cigarettes, uncomplicated: Secondary | ICD-10-CM | POA: Diagnosis not present

## 2019-08-25 MED ORDER — PREDNISONE 20 MG PO TABS
40.0000 mg | ORAL_TABLET | Freq: Once | ORAL | Status: AC
  Administered 2019-08-25: 40 mg via ORAL
  Filled 2019-08-25: qty 2

## 2019-08-25 MED ORDER — PREDNISONE 20 MG PO TABS: 40.0000 mg | ORAL_TABLET | Freq: Every day | ORAL | 0 refills | Status: DC

## 2019-08-25 NOTE — ED Triage Notes (Signed)
Patient complains of her asthma acting up x 3 days. Taking inhaler but states that her lungs still feel tight. Speaking complete sentences

## 2019-08-25 NOTE — ED Notes (Signed)
Patient verbalizes understanding of discharge instructions. Opportunity for questioning and answers were provided. Armband removed by staff, pt discharged from ED.  

## 2019-08-25 NOTE — Discharge Instructions (Signed)
Please read attached information. If you experience any new or worsening signs or symptoms please return to the emergency room for evaluation. Please follow-up with your primary care provider or specialist as discussed. Please use medication prescribed only as directed and discontinue taking if you have any concerning signs or symptoms.   °

## 2019-08-25 NOTE — ED Provider Notes (Signed)
[facility] EMERGENCY DEPARTMENT Provider Note   CSN: [id number] Arrival date & time: 08/25/19  1754     History   Chief Complaint No chief complaint on file.   HPI Jasmine Heath is a 48 y.o. female.     HPI   48 year old female presents today with complaints of asthma exacerbation.  Patient notes 3-day history of increased wheezing.  She denies any significant productive cough, denies any rhinorrhea nasal congestion or fever.  She notes this is identical to previous asthma exacerbation.  She has no significant shortness of breath presently.  She does note a close contact with rhinovirus, no close COVID contact.   Past Medical History:  Diagnosis Date  . Asthma     Patient Active Problem List   Diagnosis Date Noted  . Diabetes mellitus ([place]) 11/17/2012  . Anemia 11/15/2012  . Pain due to dental caries 11/15/2012  . Status asthmaticus 11/14/2012  . SOB (shortness of breath) 11/14/2012  . Morbid obesity ([place]) 11/14/2012  . Hypokalemia 11/14/2012    Past Surgical History:  Procedure Laterality Date  . FOOT SURGERY     I+D     OB History   No obstetric history on file.      Home Medications    Prior to Admission medications   Medication Sig Start Date End Date Taking? Authorizing Provider  albuterol (VENTOLIN HFA) 108 (90 Base) MCG/ACT inhaler Inhale 1-2 puffs into the lungs every 6 (six) hours as needed for wheezing or shortness of breath. 06/16/19   Antonietta Breach, PA-C  amoxicillin-clavulanate (AUGMENTIN) 875-125 MG tablet Take 1 tablet by mouth every 12 (twelve) hours for 7 days. 08/26/19 09/02/19  Chase Picket, MD  ipratropium-albuterol (DUONEB) 0.5-2.5 (3) MG/3ML SOLN Take 3 mLs by nebulization every 4 (four) hours as needed. 01/05/19   Wieters, Hallie C, PA-C  ondansetron (ZOFRAN ODT) 4 MG disintegrating tablet Take 1 tablet (4 mg total) by mouth every 8 (eight) hours as needed for nausea or vomiting. 08/26/19   Lamptey, Myrene Galas, MD   Phenylephrine-DM (THERAFLU COLD/COUGH DAYTIME PO) Take 30 mLs by mouth daily as needed (cold symptoms).    [provider]  predniSONE (DELTASONE) 20 MG tablet Take 2 tablets (40 mg total) by mouth daily. 08/25/19   Okey Regal, PA-C    Family History Family History  Problem Relation Age of Onset  . Asthma Mother     Social History Social History   Tobacco Use  . Smoking status: Current Every Day Smoker    Packs/day: 0.25    Types: Cigarettes  . Smokeless tobacco: Never Used  Substance Use Topics  . Alcohol use: Yes    Frequency: Never    Comment: Reports she drinks on holidays   . Drug use: No     Allergies   Patient has no known allergies.   Review of Systems Review of Systems  All other systems reviewed and are negative.  Physical Exam Updated Vital Signs BP (!) 130/55   Pulse 85   Temp 98.1 F (36.7 C) (Oral)   Resp (!) 22   SpO2 94%   Physical Exam Vitals signs and nursing note reviewed.  Constitutional:      Appearance: She is well-developed.  HENT:     Head: Normocephalic and atraumatic.  Eyes:     General: No scleral icterus.       Right eye: No discharge.        Left eye: No discharge.  Conjunctiva/sclera: Conjunctivae normal.     Pupils: Pupils are equal, round, and reactive to light.  Neck:     Musculoskeletal: Normal range of motion.     Vascular: No JVD.     Trachea: No tracheal deviation.  Pulmonary:     Effort: Pulmonary effort is normal.     Breath sounds: No stridor.     Comments: Bilateral expiratory wheeze, no crackles, no respiratory distress Neurological:     Mental Status: She is alert and oriented to person, place, and time.     Coordination: Coordination normal.  Psychiatric:        Behavior: Behavior normal.        Thought Content: Thought content normal.        Judgment: Judgment normal.     ED Treatments / Results  Labs (all labs ordered are listed, but only abnormal results are displayed) Labs  Reviewed - No data to display  EKG None  Radiology No results found.  Procedures Procedures (including critical care time)  Medications Ordered in ED Medications  predniSONE (DELTASONE) tablet 40 mg (40 mg Oral Given 08/25/19 2234)     Initial Impression / Assessment and Plan / ED Course  I have reviewed the triage vital signs and the nursing notes.  Pertinent labs & imaging results that were available during my care of the patient were reviewed by me and considered in my medical decision making (see chart for details).          Assessment/Plan: 48 year old female presents today with asthma exacerbation.  She has no signs of infectious etiology.  She will be discharged home with prednisone, albuterol, return precautions.  She verbalized understanding and agreement to today's plan had no further questions or concerns the time discharge.    Final Clinical Impressions(s) / ED Diagnoses   Final diagnoses:  Exacerbation of asthma, unspecified asthma severity, unspecified whether persistent    ED Discharge Orders         Ordered    predniSONE (DELTASONE) 20 MG tablet  Daily     08/25/19 2205           Okey Regal, PA-C 08/30/19 [place], DO 08/31/19 [id number]

## 2019-08-26 ENCOUNTER — Other Ambulatory Visit: Payer: Self-pay

## 2019-08-26 ENCOUNTER — Encounter (HOSPITAL_COMMUNITY): Payer: Self-pay | Admitting: Emergency Medicine

## 2019-08-26 ENCOUNTER — Ambulatory Visit (HOSPITAL_COMMUNITY)
Admission: EM | Admit: 2019-08-26 | Discharge: 2019-08-26 | Disposition: A | Discharge status: Home / Self Care | Payer: Self-pay | Attending: Internal Medicine | Admitting: Internal Medicine

## 2019-08-26 DIAGNOSIS — K047 Periapical abscess without sinus: Secondary | ICD-10-CM | POA: Diagnosis not present

## 2019-08-26 MED ORDER — AMOXICILLIN-POT CLAVULANATE 875-125 MG PO TABS: 1.0000 | ORAL_TABLET | Freq: Two times a day (BID) | ORAL | 0 refills | Status: AC

## 2019-08-26 MED ORDER — ONDANSETRON 4 MG PO TBDP: 4.0000 mg | ORAL_TABLET | Freq: Three times a day (TID) | ORAL | 0 refills | Status: DC | PRN

## 2019-08-26 NOTE — ED Provider Notes (Signed)
[facility]    CSN: [id number] Arrival date & time: 08/26/19  1053      History   Chief Complaint Chief Complaint  Patient presents with  . Dental Pain    HPI Jasmine Heath is a 48 y.o. female with a history of asthma currently controlled comes to urgent care with complaint of dental pain of 1 week duration.  Pain started a week ago and is been persistent.  She is taking Advil at home with no improvement.  She denies any facial swelling, difficulty swallowing, fever or chills.  Patient admits to having some epigastric pain without nausea or vomiting.  No shortness of breath, wheezing or cough.  HPI  Past Medical History:  Diagnosis Date  . Asthma     Patient Active Problem List   Diagnosis Date Noted  . Diabetes mellitus ([place]) 11/17/2012  . Anemia 11/15/2012  . Pain due to dental caries 11/15/2012  . Status asthmaticus 11/14/2012  . SOB (shortness of breath) 11/14/2012  . Morbid obesity ([place]) 11/14/2012  . Hypokalemia 11/14/2012    Past Surgical History:  Procedure Laterality Date  . FOOT SURGERY     I+D    OB History   No obstetric history on file.      Home Medications    Prior to Admission medications   Medication Sig Start Date End Date Taking? Authorizing Provider  albuterol (VENTOLIN HFA) 108 (90 Base) MCG/ACT inhaler Inhale 1-2 puffs into the lungs every 6 (six) hours as needed for wheezing or shortness of breath. 06/16/19   Antonietta Breach, PA-C  amoxicillin-clavulanate (AUGMENTIN) 875-125 MG tablet Take 1 tablet by mouth every 12 (twelve) hours for 7 days. 08/26/19 09/02/19  Chase Picket, MD  ipratropium-albuterol (DUONEB) 0.5-2.5 (3) MG/3ML SOLN Take 3 mLs by nebulization every 4 (four) hours as needed. 01/05/19   Wieters, Hallie C, PA-C  ondansetron (ZOFRAN ODT) 4 MG disintegrating tablet Take 1 tablet (4 mg total) by mouth every 8 (eight) hours as needed for nausea or vomiting. 08/26/19   Aura Bibby, Myrene Galas, MD  Phenylephrine-DM (THERAFLU  COLD/COUGH DAYTIME PO) Take 30 mLs by mouth daily as needed (cold symptoms).    [provider]  predniSONE (DELTASONE) 20 MG tablet Take 2 tablets (40 mg total) by mouth daily. 08/25/19   Okey Regal, PA-C    Family History Family History  Problem Relation Age of Onset  . Asthma Mother     Social History Social History   Tobacco Use  . Smoking status: Current Every Day Smoker    Packs/day: 0.25    Types: Cigarettes  . Smokeless tobacco: Never Used  Substance Use Topics  . Alcohol use: Yes    Frequency: Never    Comment: Reports she drinks on holidays   . Drug use: No     Allergies   Patient has no known allergies.   Review of Systems Review of Systems  Constitutional: Negative.   HENT: Negative.  Negative for ear discharge, ear pain and sore throat.   Respiratory: Negative for cough, shortness of breath and wheezing.   Genitourinary: Negative.   Musculoskeletal: Negative.   Skin: Negative.      Physical Exam Triage Vital Signs ED Triage Vitals [08/26/19 1114]  Enc Vitals Group     BP (!) 152/84     Pulse Rate 76     Resp 18     Temp 97.7 F (36.5 C)     Temp Source Oral     SpO2  95 %     Weight      Height      Head Circumference      Peak Flow      Pain Score      Pain Loc      Pain Edu?      Excl. in [place]?    No data found.  Updated Vital Signs BP (!) 152/84 (BP Location: Right Arm)   Pulse 76   Temp 97.7 F (36.5 C) (Oral)   Resp 18   SpO2 95%   Visual Acuity Right Eye Distance:   Left Eye Distance:   Bilateral Distance:    Right Eye Near:   Left Eye Near:    Bilateral Near:     Physical Exam Vitals signs and nursing note reviewed.  Constitutional:      General: She is in acute distress.     Appearance: She is not ill-appearing.  HENT:     Right Ear: Tympanic membrane normal.     Left Ear: Tympanic membrane normal.     Mouth/Throat:     Comments: Dental caries in the right upper molars.  No jaw swelling or  discharge. Cardiovascular:     Rate and Rhythm: Normal rate and regular rhythm.     Pulses: Normal pulses.     Heart sounds: Normal heart sounds.  Pulmonary:     Effort: Pulmonary effort is normal. No respiratory distress.     Breath sounds: Normal breath sounds. No rhonchi or rales.  Abdominal:     General: Bowel sounds are normal.     Palpations: Abdomen is soft.  Musculoskeletal: Normal range of motion.  Skin:    General: Skin is warm.     Capillary Refill: Capillary refill takes less than 2 seconds.  Neurological:     General: No focal deficit present.     Mental Status: She is alert.      UC Treatments / Results  Labs (all labs ordered are listed, but only abnormal results are displayed) Labs Reviewed - No data to display  EKG   Radiology No results found.  Procedures Procedures (including critical care time)  Medications Ordered in UC Medications - No data to display  Initial Impression / Assessment and Plan / UC Course  I have reviewed the triage vital signs and the nursing notes.  Pertinent labs & imaging results that were available during my care of the patient were reviewed by me and considered in my medical decision making (see chart for details).     1.  Dental infection: Augmentin 875-125 mg 1 tablet twice daily for 7 days Continue Advil as needed for tooth pain Dental resources in the community given to the patient Patient will need teeth extraction If patient's symptoms worsen she is advised to reach out to her dentist's office or return to urgent care to be reevaluated. Final Clinical Impressions(s) / UC Diagnoses   Final diagnoses:  Dental infection   Discharge Instructions   None    ED Prescriptions    Medication Sig Dispense Auth. Provider   amoxicillin-clavulanate (AUGMENTIN) 875-125 MG tablet Take 1 tablet by mouth every 12 (twelve) hours for 7 days. 14 tablet Jaylea Plourde, Myrene Galas, MD   ondansetron (ZOFRAN ODT) 4 MG disintegrating  tablet Take 1 tablet (4 mg total) by mouth every 8 (eight) hours as needed for nausea or vomiting. 20 tablet Kameo Bains, Myrene Galas, MD     PDMP not reviewed this encounter.   Chase Picket,  MD 08/26/19 1234

## 2019-08-26 NOTE — ED Triage Notes (Signed)
Pt sts dental pain; pt on phone during triage

## 2019-12-23 ENCOUNTER — Other Ambulatory Visit: Payer: Self-pay | Admitting: Physician Assistant

## 2019-12-23 ENCOUNTER — Other Ambulatory Visit (HOSPITAL_COMMUNITY)
Admission: RE | Admit: 2019-12-23 | Discharge: 2019-12-23 | Disposition: A | Discharge status: Home / Self Care | Payer: BC Managed Care – PPO | Source: Ambulatory Visit | Attending: Physician Assistant | Admitting: Physician Assistant

## 2019-12-23 DIAGNOSIS — Z124 Encounter for screening for malignant neoplasm of cervix: Secondary | ICD-10-CM | POA: Insufficient documentation

## 2019-12-23 DIAGNOSIS — R1011 Right upper quadrant pain: Secondary | ICD-10-CM

## 2019-12-24 LAB — CYTOLOGY - PAP: Diagnosis: NEGATIVE

## 2019-12-28 ENCOUNTER — Other Ambulatory Visit: Payer: BC Managed Care – PPO

## 2019-12-30 ENCOUNTER — Ambulatory Visit
Admission: RE | Admit: 2019-12-30 | Discharge: 2019-12-30 | Disposition: A | Discharge status: Home / Self Care | Payer: BC Managed Care – PPO | Source: Ambulatory Visit | Attending: Physician Assistant | Admitting: Physician Assistant

## 2019-12-30 DIAGNOSIS — R1011 Right upper quadrant pain: Secondary | ICD-10-CM

## 2020-02-24 ENCOUNTER — Other Ambulatory Visit: Payer: Self-pay | Admitting: Physician Assistant

## 2020-02-24 DIAGNOSIS — Z1231 Encounter for screening mammogram for malignant neoplasm of breast: Secondary | ICD-10-CM

## 2020-03-16 ENCOUNTER — Other Ambulatory Visit: Payer: Self-pay

## 2020-03-16 ENCOUNTER — Ambulatory Visit
Admission: RE | Admit: 2020-03-16 | Discharge: 2020-03-16 | Disposition: A | Discharge status: Home / Self Care | Payer: BC Managed Care – PPO | Source: Ambulatory Visit | Attending: Physician Assistant | Admitting: Physician Assistant

## 2020-03-16 DIAGNOSIS — Z1231 Encounter for screening mammogram for malignant neoplasm of breast: Secondary | ICD-10-CM

## 2020-03-17 ENCOUNTER — Other Ambulatory Visit: Payer: Self-pay | Admitting: Physician Assistant

## 2020-03-17 DIAGNOSIS — R928 Other abnormal and inconclusive findings on diagnostic imaging of breast: Secondary | ICD-10-CM

## 2020-03-27 ENCOUNTER — Other Ambulatory Visit: Payer: BC Managed Care – PPO

## 2020-04-07 ENCOUNTER — Other Ambulatory Visit: Payer: BC Managed Care – PPO

## 2020-05-25 ENCOUNTER — Other Ambulatory Visit: Payer: Self-pay

## 2020-05-25 ENCOUNTER — Ambulatory Visit
Admission: RE | Admit: 2020-05-25 | Discharge: 2020-05-25 | Disposition: A | Discharge status: Home / Self Care | Payer: BC Managed Care – PPO | Source: Ambulatory Visit | Attending: Physician Assistant | Admitting: Physician Assistant

## 2020-05-25 ENCOUNTER — Other Ambulatory Visit: Payer: Self-pay | Admitting: Physician Assistant

## 2020-05-25 DIAGNOSIS — R928 Other abnormal and inconclusive findings on diagnostic imaging of breast: Secondary | ICD-10-CM

## 2020-05-25 DIAGNOSIS — D241 Benign neoplasm of right breast: Secondary | ICD-10-CM

## 2020-05-25 DIAGNOSIS — D242 Benign neoplasm of left breast: Secondary | ICD-10-CM

## 2020-05-30 ENCOUNTER — Encounter (HOSPITAL_COMMUNITY): Payer: Self-pay

## 2020-05-30 ENCOUNTER — Ambulatory Visit (HOSPITAL_COMMUNITY)
Admission: EM | Admit: 2020-05-30 | Discharge: 2020-05-30 | Disposition: A | Discharge status: Home / Self Care | Payer: BC Managed Care – PPO | Attending: Urgent Care | Admitting: Urgent Care

## 2020-05-30 ENCOUNTER — Other Ambulatory Visit: Payer: Self-pay

## 2020-05-30 DIAGNOSIS — J4520 Mild intermittent asthma, uncomplicated: Secondary | ICD-10-CM | POA: Diagnosis not present

## 2020-05-30 DIAGNOSIS — Z20822 Contact with and (suspected) exposure to covid-19: Secondary | ICD-10-CM | POA: Insufficient documentation

## 2020-05-30 DIAGNOSIS — F17200 Nicotine dependence, unspecified, uncomplicated: Secondary | ICD-10-CM

## 2020-05-30 DIAGNOSIS — R062 Wheezing: Secondary | ICD-10-CM

## 2020-05-30 DIAGNOSIS — R0602 Shortness of breath: Secondary | ICD-10-CM | POA: Diagnosis present

## 2020-05-30 LAB — SARS CORONAVIRUS 2 (TAT 6-24 HRS): SARS Coronavirus 2: NEGATIVE

## 2020-05-30 MED ORDER — ALBUTEROL SULFATE (2.5 MG/3ML) 0.083% IN NEBU
2.5000 mg | INHALATION_SOLUTION | Freq: Four times a day (QID) | RESPIRATORY_TRACT | 0 refills | Status: DC | PRN
Start: 2020-05-30 — End: 2022-10-29

## 2020-05-30 MED ORDER — PREDNISONE 20 MG PO TABS
40.0000 mg | ORAL_TABLET | Freq: Every day | ORAL | 0 refills | Status: DC
Start: 2020-05-30 — End: 2020-11-29

## 2020-05-30 MED ORDER — ALBUTEROL SULFATE HFA 108 (90 BASE) MCG/ACT IN AERS: 1.0000 | INHALATION_SPRAY | Freq: Four times a day (QID) | RESPIRATORY_TRACT | 0 refills | Status: DC | PRN

## 2020-05-30 NOTE — ED Triage Notes (Signed)
Pt presents with asthma flare up; pt states she has had shortness of breath since yesterday.  Pt states albuterol inhaler and nebulizer solution work better for her but she is all out of both.

## 2020-05-30 NOTE — ED Provider Notes (Signed)
[facility]   MRN: [id number] DOB: [birth year]  Subjective:   Jasmine Heath is a 49 y.o. female presenting for 3-day history of acute onset recurrent shortness of breath, chest tightness and wheezing.  Patient was at a barbecue on Saturday and had a lot of smoke exposure that elicited her asthma flare.  She states that she is out of her albuterol inhaler and nebulizer.  Denies Covid vaccination, has not had Covid.  Is not opposed to getting tested.  No current facility-administered medications for this encounter.  Current Outpatient Medications:  .  albuterol (VENTOLIN HFA) 108 (90 Base) MCG/ACT inhaler, Inhale 1-2 puffs into the lungs every 6 (six) hours as needed for wheezing or shortness of breath., Disp: 18 g, Rfl: 0 .  ipratropium-albuterol (DUONEB) 0.5-2.5 (3) MG/3ML SOLN, Take 3 mLs by nebulization every 4 (four) hours as needed., Disp: 360 mL, Rfl: 0 .  ondansetron (ZOFRAN ODT) 4 MG disintegrating tablet, Take 1 tablet (4 mg total) by mouth every 8 (eight) hours as needed for nausea or vomiting., Disp: 20 tablet, Rfl: 0 .  Phenylephrine-DM (THERAFLU COLD/COUGH DAYTIME PO), Take 30 mLs by mouth daily as needed (cold symptoms)., Disp: , Rfl:  .  predniSONE (DELTASONE) 20 MG tablet, Take 2 tablets (40 mg total) by mouth daily., Disp: 10 tablet, Rfl: 0   No Known Allergies  Past Medical History:  Diagnosis Date  . Asthma      Past Surgical History:  Procedure Laterality Date  . FOOT SURGERY     I+D    Family History  Problem Relation Age of Onset  . Asthma Mother     Social History   Tobacco Use  . Smoking status: Current Every Day Smoker    Packs/day: 0.25    Types: Cigarettes  . Smokeless tobacco: Never Used  Vaping Use  . Vaping Use: Never used  Substance Use Topics  . Alcohol use: Yes    Comment: Reports she drinks on holidays   . Drug use: No    ROS   Objective:   Vitals: BP 136/78 (BP Location: Right Arm)   Pulse 79   Temp 98.7 F (37.1  C) (Oral)   Resp 17   LMP 05/27/2020   SpO2 95%   Physical Exam Constitutional:      General: She is not in acute distress.    Appearance: Normal appearance. She is well-developed. She is not ill-appearing, toxic-appearing or diaphoretic.  HENT:     Head: Normocephalic and atraumatic.     Nose: Nose normal.     Mouth/Throat:     Mouth: Mucous membranes are moist.  Eyes:     Extraocular Movements: Extraocular movements intact.     Pupils: Pupils are equal, round, and reactive to light.  Cardiovascular:     Rate and Rhythm: Normal rate and regular rhythm.     Pulses: Normal pulses.     Heart sounds: Normal heart sounds. No murmur heard.  No friction rub. No gallop.   Pulmonary:     Effort: Pulmonary effort is normal. No respiratory distress.     Breath sounds: No stridor. Examination of the right-upper field reveals wheezing. Examination of the left-upper field reveals wheezing. Examination of the right-middle field reveals wheezing. Examination of the left-middle field reveals wheezing. Examination of the right-lower field reveals wheezing. Examination of the left-lower field reveals wheezing. Wheezing present. No rhonchi or rales.  Skin:    General: Skin is warm and dry.     Findings:  No rash.  Neurological:     Mental Status: She is alert and oriented to person, place, and time.  Psychiatric:        Mood and Affect: Mood normal.        Behavior: Behavior normal.        Thought Content: Thought content normal.       Assessment and Plan :   PDMP not reviewed this encounter.  1. Mild intermittent asthma without complication   2. Shortness of breath   3. Wheezing   4. Smoker     Start oral prednisone course, refilled nebulized albuterol, albuterol inhaler.  COVID-19 testing pending. Counseled patient on potential for adverse effects with medications prescribed/recommended today, ER and return-to-clinic precautions discussed, patient verbalized understanding.    Jaynee Eagles, PA-C 05/30/20 1052

## 2020-11-29 ENCOUNTER — Encounter (HOSPITAL_COMMUNITY): Payer: Self-pay | Admitting: Emergency Medicine

## 2020-11-29 ENCOUNTER — Emergency Department (HOSPITAL_COMMUNITY): Payer: BC Managed Care – PPO

## 2020-11-29 ENCOUNTER — Other Ambulatory Visit: Payer: Self-pay

## 2020-11-29 ENCOUNTER — Inpatient Hospital Stay (HOSPITAL_COMMUNITY)
Admission: EM | Admit: 2020-11-29 | Discharge: 2020-12-03 | DRG: 177 | Disposition: A | Discharge status: Home / Self Care | Payer: BC Managed Care – PPO | Attending: Internal Medicine | Admitting: Internal Medicine

## 2020-11-29 ENCOUNTER — Ambulatory Visit (HOSPITAL_COMMUNITY)
Admission: EM | Admit: 2020-11-29 | Discharge: 2020-11-29 | Disposition: A | Discharge status: Home / Self Care | Payer: BC Managed Care – PPO

## 2020-11-29 ENCOUNTER — Encounter (HOSPITAL_COMMUNITY): Payer: Self-pay | Admitting: *Deleted

## 2020-11-29 DIAGNOSIS — Z79899 Other long term (current) drug therapy: Secondary | ICD-10-CM

## 2020-11-29 DIAGNOSIS — R0902 Hypoxemia: Secondary | ICD-10-CM

## 2020-11-29 DIAGNOSIS — F17210 Nicotine dependence, cigarettes, uncomplicated: Secondary | ICD-10-CM | POA: Diagnosis present

## 2020-11-29 DIAGNOSIS — Z6841 Body Mass Index (BMI) 40.0 and over, adult: Secondary | ICD-10-CM

## 2020-11-29 DIAGNOSIS — J1282 Pneumonia due to coronavirus disease 2019: Secondary | ICD-10-CM | POA: Diagnosis present

## 2020-11-29 DIAGNOSIS — J9601 Acute respiratory failure with hypoxia: Secondary | ICD-10-CM | POA: Diagnosis present

## 2020-11-29 DIAGNOSIS — R7303 Prediabetes: Secondary | ICD-10-CM | POA: Diagnosis present

## 2020-11-29 DIAGNOSIS — U071 COVID-19: Secondary | ICD-10-CM | POA: Diagnosis present

## 2020-11-29 DIAGNOSIS — Z7952 Long term (current) use of systemic steroids: Secondary | ICD-10-CM

## 2020-11-29 DIAGNOSIS — Z825 Family history of asthma and other chronic lower respiratory diseases: Secondary | ICD-10-CM | POA: Diagnosis not present

## 2020-11-29 DIAGNOSIS — J45901 Unspecified asthma with (acute) exacerbation: Secondary | ICD-10-CM | POA: Diagnosis present

## 2020-11-29 DIAGNOSIS — J9600 Acute respiratory failure, unspecified whether with hypoxia or hypercapnia: Secondary | ICD-10-CM | POA: Diagnosis not present

## 2020-11-29 LAB — COMPREHENSIVE METABOLIC PANEL
ALT: 31 U/L (ref 0–44)
AST: 38 U/L (ref 15–41)
Albumin: 3.7 g/dL (ref 3.5–5.0)
Alkaline Phosphatase: 52 U/L (ref 38–126)
Anion gap: 10 (ref 5–15)
BUN: 10 mg/dL (ref 6–20)
CO2: 25 mmol/L (ref 22–32)
Calcium: 8.8 mg/dL — ABNORMAL LOW (ref 8.9–10.3)
Chloride: 102 mmol/L (ref 98–111)
Creatinine, Ser: 0.82 mg/dL (ref 0.44–1.00)
GFR, Estimated: >60 mL/min (ref >60)
Glucose, Bld: 148 mg/dL — ABNORMAL HIGH (ref 70–99)
Potassium: 3.7 mmol/L (ref 3.5–5.1)
Sodium: 137 mmol/L (ref 135–145)
Total Bilirubin: 0.5 mg/dL (ref 0.3–1.2)
Total Protein: 7.1 g/dL (ref 6.5–8.1)

## 2020-11-29 LAB — I-STAT BETA HCG BLOOD, ED (MC, WL, AP ONLY): I-stat hCG, quantitative: <5 m[IU]/mL (ref <5)

## 2020-11-29 LAB — CBC
HCT: 37.4 % (ref 36.0–46.0)
Hemoglobin: 12 g/dL (ref 12.0–15.0)
MCH: 29.4 pg (ref 26.0–34.0)
MCHC: 32.1 g/dL (ref 30.0–36.0)
MCV: 91.7 fL (ref 80.0–100.0)
Platelets: 247 10*3/uL (ref 150–400)
RBC: 4.08 MIL/uL (ref 3.87–5.11)
RDW: 14 % (ref 11.5–15.5)
WBC: 7.7 10*3/uL (ref 4.0–10.5)
nRBC: 0 % (ref 0.0–0.2)

## 2020-11-29 LAB — BASIC METABOLIC PANEL
Anion gap: 8 (ref 5–15)
BUN: 10 mg/dL (ref 6–20)
CO2: 26 mmol/L (ref 22–32)
Calcium: 8.8 mg/dL — ABNORMAL LOW (ref 8.9–10.3)
Chloride: 105 mmol/L (ref 98–111)
Creatinine, Ser: 0.78 mg/dL (ref 0.44–1.00)
GFR, Estimated: >60 mL/min (ref >60)
Glucose, Bld: 110 mg/dL — ABNORMAL HIGH (ref 70–99)
Potassium: 3.9 mmol/L (ref 3.5–5.1)
Sodium: 139 mmol/L (ref 135–145)

## 2020-11-29 LAB — RESP PANEL BY RT-PCR (FLU A&B, COVID) ARPGX2
Influenza A by PCR: NEGATIVE
Influenza B by PCR: NEGATIVE
SARS Coronavirus 2 by RT PCR: POSITIVE — AB

## 2020-11-29 LAB — FIBRINOGEN: Fibrinogen: 496 mg/dL — ABNORMAL HIGH (ref 210–475)

## 2020-11-29 LAB — LACTIC ACID, PLASMA: Lactic Acid, Venous: 1.1 mmol/L (ref 0.5–1.9)

## 2020-11-29 LAB — PROCALCITONIN: Procalcitonin: <0.1 ng/mL

## 2020-11-29 LAB — D-DIMER, QUANTITATIVE: D-Dimer, Quant: 0.41 ug/mL-FEU (ref 0.00–0.50)

## 2020-11-29 LAB — LACTATE DEHYDROGENASE: LDH: 247 U/L — ABNORMAL HIGH (ref 98–192)

## 2020-11-29 LAB — TRIGLYCERIDES: Triglycerides: 69 mg/dL (ref <150)

## 2020-11-29 MED ORDER — ZINC SULFATE 220 (50 ZN) MG PO CAPS
220.0000 mg | ORAL_CAPSULE | Freq: Every day | ORAL | Status: DC
  Administered 2020-11-30 – 2020-12-03 (×4): 220 mg via ORAL
  Filled 2020-11-29 (×4): qty 1

## 2020-11-29 MED ORDER — ACETAMINOPHEN 650 MG RE SUPP: 650.0000 mg | Freq: Four times a day (QID) | RECTAL | Status: DC | PRN

## 2020-11-29 MED ORDER — PREDNISONE 20 MG PO TABS
60.0000 mg | ORAL_TABLET | Freq: Once | ORAL | Status: AC
  Administered 2020-11-29: 60 mg via ORAL
  Filled 2020-11-29: qty 3

## 2020-11-29 MED ORDER — IPRATROPIUM BROMIDE HFA 17 MCG/ACT IN AERS
2.0000 | INHALATION_SPRAY | Freq: Once | RESPIRATORY_TRACT | Status: AC
  Administered 2020-11-29: 2 via RESPIRATORY_TRACT
  Filled 2020-11-29: qty 12.9

## 2020-11-29 MED ORDER — SODIUM CHLORIDE 0.9 % IV SOLN
100.0000 mg | Freq: Every day | INTRAVENOUS | Status: AC
  Administered 2020-11-30 – 2020-12-03 (×4): 100 mg via INTRAVENOUS
  Filled 2020-11-29 (×4): qty 20
  Filled 2020-11-29: qty 100

## 2020-11-29 MED ORDER — HYDROCOD POLST-CPM POLST ER 10-8 MG/5ML PO SUER: 5.0000 mL | Freq: Two times a day (BID) | ORAL | Status: DC | PRN

## 2020-11-29 MED ORDER — ASCORBIC ACID 500 MG PO TABS
500.0000 mg | ORAL_TABLET | Freq: Every day | ORAL | Status: DC
  Administered 2020-11-30 – 2020-12-03 (×4): 500 mg via ORAL
  Filled 2020-11-29 (×4): qty 1

## 2020-11-29 MED ORDER — SODIUM CHLORIDE 0.9 % IV SOLN
200.0000 mg | Freq: Once | INTRAVENOUS | Status: AC
  Administered 2020-11-30: 02:00:00 200 mg via INTRAVENOUS
  Filled 2020-11-29: qty 40

## 2020-11-29 MED ORDER — AEROCHAMBER PLUS FLO-VU LARGE MISC
Status: AC
  Filled 2020-11-29: qty 1

## 2020-11-29 MED ORDER — PREDNISONE 5 MG PO TABS
50.0000 mg | ORAL_TABLET | Freq: Every day | ORAL | Status: DC
  Administered 2020-12-03: 50 mg via ORAL
  Filled 2020-11-29: qty 2

## 2020-11-29 MED ORDER — ACETAMINOPHEN 325 MG PO TABS
650.0000 mg | ORAL_TABLET | Freq: Once | ORAL | Status: DC
  Filled 2020-11-29: qty 2

## 2020-11-29 MED ORDER — METHYLPREDNISOLONE SODIUM SUCC 125 MG IJ SOLR
60.0000 mg | Freq: Two times a day (BID) | INTRAMUSCULAR | Status: AC
  Administered 2020-11-29 – 2020-12-02 (×6): 60 mg via INTRAVENOUS
  Filled 2020-11-29 (×6): qty 2

## 2020-11-29 MED ORDER — ACETAMINOPHEN 325 MG PO TABS: 650.0000 mg | ORAL_TABLET | Freq: Four times a day (QID) | ORAL | Status: DC | PRN

## 2020-11-29 MED ORDER — ENOXAPARIN SODIUM 40 MG/0.4ML ~~LOC~~ SOLN
40.0000 mg | Freq: Every day | SUBCUTANEOUS | Status: DC
  Administered 2020-11-30 – 2020-12-01 (×2): 40 mg via SUBCUTANEOUS
  Filled 2020-11-29 (×2): qty 0.4

## 2020-11-29 MED ORDER — ALBUTEROL SULFATE HFA 108 (90 BASE) MCG/ACT IN AERS
8.0000 | INHALATION_SPRAY | Freq: Once | RESPIRATORY_TRACT | Status: AC
  Administered 2020-11-29: 8 via RESPIRATORY_TRACT
  Filled 2020-11-29: qty 6.7

## 2020-11-29 MED ORDER — ALBUTEROL SULFATE HFA 108 (90 BASE) MCG/ACT IN AERS
8.0000 | INHALATION_SPRAY | RESPIRATORY_TRACT | Status: DC
  Administered 2020-11-29 – 2020-11-30 (×5): 8 via RESPIRATORY_TRACT

## 2020-11-29 MED ORDER — GUAIFENESIN-DM 100-10 MG/5ML PO SYRP: 10.0000 mL | ORAL_SOLUTION | ORAL | Status: DC | PRN

## 2020-11-29 NOTE — ED Notes (Signed)
Pt is yelling and screaming in room. Writer into room. Pt screaming and cussing at this RN. States "I can't fucking breathe, yall dont give a damn about me." States she needs a breathing treatment and mad at this RN because I cannot give her any medication without an order from the provider. Will make provider aware of pt behaviors.

## 2020-11-29 NOTE — ED Notes (Signed)
Dr hagler aware of patient, steroids offered and offered to be seen.  Patient specifically demanding a breathing treatment.  No nebulizer available and it was explained that we do not administer nebulizer's at this time.  Patient stated that was all she wanted and was going to ED.  Edison Simon, RN escorted patient by wheelchair to door

## 2020-11-29 NOTE — ED Notes (Signed)
Attempted to ambulate pt in room without oxygen, pt became very sob just standing up and dropped to 84% on room air, pt placed back on 3L [place]

## 2020-11-29 NOTE — ED Notes (Signed)
Admitting Team at Bedside.   

## 2020-11-29 NOTE — ED Triage Notes (Signed)
Patient arrives to ED from UC with c/o shortness of breath x2 days. Pt states the SOB came on gradual and is worsening. Pt states hx of asthma and has had no relief from inhaler. Pt denies CP, fever, chills, sore throat. States wants nebulizer bc that's all that helps. SpO2 98%.

## 2020-11-29 NOTE — ED Triage Notes (Signed)
Pt presents with [facility] that started on Monday. Pt has used [facility] with out relief . Pt's neb machine is broken at this time. Pt has meds for for the machine.

## 2020-11-29 NOTE — ED Notes (Signed)
Patient is being discharged from the Urgent Care and sent to the Emergency Department via private vehicle . Per dr hagler, patient is in need of higher level of care due to patients demand for a higher level of care service/breathing treatment. Patient is aware and verbalizes understanding of plan of care.  Vitals:   11/29/20 0902  BP: 126/81  Pulse: 95  Resp: 20  Temp: 99.3 F (37.4 C)  SpO2: 94%

## 2020-11-29 NOTE — ED Notes (Signed)
EDP made aware of pts temp

## 2020-11-29 NOTE — ED Provider Notes (Signed)
[facility] Provider Note   CSN: [id number] Arrival date & time: 11/29/20  [id number]     History Chief Complaint  [facility] presents with  . Shortness of Breath    [facility] is a 49 y.o. female with PMH of asthma who presents the ED with complaints of shortness of breath.  On my examination, [facility] is frustrated because [facility] feels as though [facility] might die.  [facility] states that [facility] wheezing and shortness of breath symptoms began approximately 48 hours ago and have been getting progressively worse.  [facility] states that [facility] at home nebulizer machine is broken and that nebulizer therapy is the only thing that works.  [facility] is also frustrated because COVID-19 screening had not been obtained and I informed [facility] that negative test is required before we can initiate nebulizer therapies.  In addition to the wheezing, [facility] also endorses chest tightness which [facility] states is comparable to [facility] prior asthma exacerbations.  [facility] is adamant that [facility] does not have any other symptoms, specifically denies any fevers or chills, cough, headache, congestion, sore throat, abdominal discomfort, nausea or emesis, or other symptoms.  No obvious sick contacts.  [facility] states that [facility] has been using [facility] albuterol inhaler, without significant relief.  [facility] had been placed on 2 L supplemental O2 via nasal cannula while out in triage.  HPI     Past Medical History:  Diagnosis Date  . Asthma     [facility] Active Problem List   Diagnosis Date Noted  . Diabetes mellitus ([place]) 11/17/2012  . Anemia 11/15/2012  . Pain due to dental caries 11/15/2012  . Status asthmaticus 11/14/2012  . SOB (shortness of breath) 11/14/2012  . Morbid obesity ([place] [address]) 11/14/2012  . Hypokalemia 11/14/2012    Past Surgical History:  Procedure Laterality Date  . FOOT SURGERY     I+D     OB History   No obstetric history on file.     Family History  Problem Relation Age of Onset  . Asthma Mother     Social  History   Tobacco Use  . Smoking status: Current Every Day Smoker    Packs/day: 0.25    Types: Cigarettes  . Smokeless tobacco: Never Used  Vaping Use  . Vaping Use: Never used  Substance Use Topics  . Alcohol use: Yes    Comment: Reports [facility] drinks on holidays   . Drug use: No    Home Medications Prior to Admission medications   Medication Sig Start Date End Date Taking? Authorizing Provider  albuterol (PROVENTIL) (2.5 MG/3ML) 0.083% nebulizer solution Take 3 mLs (2.5 mg total) by nebulization every 6 (six) hours as needed for wheezing or shortness of breath. 05/30/20   Jaynee Eagles, PA-C  albuterol (VENTOLIN HFA) 108 (90 Base) MCG/ACT inhaler Inhale 1-2 puffs into the lungs every 6 (six) hours as needed for wheezing or shortness of breath. 05/30/20   Jaynee Eagles, PA-C  ipratropium-albuterol (DUONEB) 0.5-2.5 (3) MG/3ML SOLN Take 3 mLs by nebulization every 4 (four) hours as needed. 01/05/19   Wieters, Hallie C, PA-C  ondansetron (ZOFRAN ODT) 4 MG disintegrating tablet Take 1 tablet (4 mg total) by mouth every 8 (eight) hours as needed for nausea or vomiting. 08/26/19   Lamptey, Myrene Galas, MD  Phenylephrine-DM (THERAFLU COLD/COUGH DAYTIME PO) Take 30 mLs by mouth daily as needed (cold symptoms).    [provider]  predniSONE (DELTASONE) 20 MG tablet Take 2 tablets (40 mg total) by mouth daily with breakfast.  05/30/20   Jaynee Eagles, PA-C    Allergies    [facility] has no known allergies.  Review of Systems   Review of Systems  All other systems reviewed and are negative.   Physical Exam Updated Vital Signs BP 128/73   Pulse 94   Temp 100.2 F (37.9 C) (Oral)   Resp (!) 21   LMP 11/05/2020   SpO2 98%   Physical Exam Vitals and nursing note reviewed. Exam conducted with a chaperone present.  Constitutional:      Appearance: [facility] is obese. [facility] is ill-appearing.  HENT:     Head: Normocephalic and atraumatic.  Eyes:     General: No scleral icterus.    Conjunctiva/sclera:  Conjunctivae normal.  Cardiovascular:     Rate and Rhythm: Normal rate and regular rhythm.     Pulses: Normal pulses.  Pulmonary:     Breath sounds: Wheezing present.     Comments: Mildly increased work of breathing and tachypnea in 20s.  On 2 L supplemental O2 via [place], oxygenating well.  Significant wheezing auscultated in all lobes. Musculoskeletal:     Right lower leg: No edema.     Left lower leg: No edema.  Skin:    General: Skin is dry.     Capillary Refill: Capillary refill takes less than 2 seconds.  Neurological:     Mental Status: [facility] is alert and oriented to person, place, and time.     GCS: GCS eye subscore is 4. GCS verbal subscore is 5. GCS motor subscore is 6.  Psychiatric:        Mood and Affect: Mood normal.        Behavior: Behavior normal.        Thought Content: Thought content normal.     ED Results / Procedures / Treatments   Labs (all labs ordered are listed, but only abnormal results are displayed) Labs Reviewed  RESP PANEL BY RT-PCR (FLU A&B, COVID) ARPGX2 - Abnormal; Notable for the following components:      Result Value   SARS Coronavirus 2 by RT PCR POSITIVE (*)    All other components within normal limits  BASIC METABOLIC PANEL - Abnormal; Notable for the following components:   Glucose, Bld 110 (*)    Calcium 8.8 (*)    All other components within normal limits  CULTURE, BLOOD (ROUTINE X 2)  CULTURE, BLOOD (ROUTINE X 2)  CBC  LACTIC ACID, PLASMA  LACTIC ACID, PLASMA  COMPREHENSIVE METABOLIC PANEL  D-DIMER, QUANTITATIVE (NOT AT [facility])  PROCALCITONIN  LACTATE DEHYDROGENASE  FERRITIN  TRIGLYCERIDES  FIBRINOGEN  C-REACTIVE PROTEIN  I-STAT BETA HCG BLOOD, ED ([facility], [facility], [facility] ONLY)    EKG EKG Interpretation  Date/Time:  Wednesday November 29 2020 09:59:09 EST Ventricular Rate:  85 PR Interval:  146 QRS Duration: 66 QT Interval:  360 QTC Calculation: 428 R Axis:   80 Text Interpretation: Normal sinus rhythm Right atrial enlargement  Borderline ECG Confirmed by Sherwood Gambler [phone]) on 11/29/2020 2:53:55 PM   Radiology DG Chest Portable 1 View  Result Date: 11/29/2020 CLINICAL DATA:  Shortness of breath for 3 days.  Asthma. EXAM: PORTABLE CHEST 1 VIEW COMPARISON:  03/29/2018 FINDINGS: The heart size and mediastinal contours are within normal limits. Both lungs are clear. The visualized skeletal structures are unremarkable. IMPRESSION: No active disease. Electronically Signed   By: Marlaine Hind M.D.   On: 11/29/2020 15:20    Procedures .Critical Care Performed by: Corena Herter, PA-C Authorized by:  Corena Herter, PA-C   Critical care provider statement:    Critical care time (minutes):  45   Critical care was necessary to treat or prevent imminent or life-threatening deterioration of the following conditions:  Respiratory failure   Critical care was time spent personally by me on the following activities:  Discussions with consultants, evaluation of [facility]'s response to treatment, examination of [facility], ordering and performing treatments and interventions, ordering and review of laboratory studies, ordering and review of radiographic studies, pulse oximetry, re-evaluation of [facility]'s condition, obtaining history from [facility] or surrogate and review of old charts Comments:     Acute hypoxic respiratory failure in setting of COVID-19   (including critical care time)  Medications Ordered in ED Medications  acetaminophen (TYLENOL) tablet 650 mg (has no administration in time range)  albuterol (VENTOLIN HFA) 108 (90 Base) MCG/ACT inhaler 8 puff (8 puffs Inhalation Given 11/29/20 1513)  ipratropium (ATROVENT HFA) inhaler 2 puff (2 puffs Inhalation Given 11/29/20 1513)  predniSONE (DELTASONE) tablet 60 mg (60 mg Oral Given 11/29/20 1514)  AeroChamber Plus Flo-Vu Large MISC (  Given 11/29/20 1522)    ED Course  I have reviewed the triage vital signs and the nursing notes.  Pertinent labs & imaging results  that were available during my care of the [facility] were reviewed by me and considered in my medical decision making (see chart for details).  Clinical Course as of 11/29/20 1919  Wed Nov 29, 2020  1745 SARS Coronavirus 2 by RT PCR(!): POSITIVE [GG]    Clinical Course User Index [GG] Corena Herter, PA-C   MDM Rules/Calculators/A&P                          [facility] cannot yet be treated with nebulizer therapy until [facility] has a negative COVID-19 test.  [facility] is frustrated, but agrees to treatment with inhaled albuterol and ipratropium with the spacer which is similarly effective.  We will also provide 60 mg Deltasone p.o. here in the ED.  Will obtain chest x-ray while COVID-19 test is pending.    Labs CBC: Within normal limits. BMP: Unremarkable. Respiratory panel by PCR: Positive.  DG chest is personally reviewed and demonstrates no acute cardiopulmonary disease.  I was notified by RN that [facility] spiked a slight fever to 100.5 F.  On reassessment, [facility] states that [facility] is beginning to feel improved.    [facility]'s respiratory panel by PCR resulted positive for COVID-19.  Will re-check [facility] vital signs now that [facility] has been taken off of [facility] supplemental O2.  Will also ambulate [facility] here in the ED.    Evidently upon standing [facility] at the bedside, [facility] O2 saturation dropped to 84% on RA and [facility] became particularly short of breath.  [facility] was placed back on 3 L supplemental O2 via [place].  On my reexamination, [facility] states that [facility] is fully immunized for COVID-19, but does endorse being around many people for a Christmas party.  [facility] states that over the course of the past couple of days [facility] has been particularly dyspneic with exertion.  J[facility] Heath suspected that it was related to [facility] history of asthma, but now knowing that [facility] is COVID-19 positive is concerned given [facility] new hypoxia and oxygen requirement.  Will obtain preadmission laboratory work-up and consult unassigned medicine for admission.  We will  also work on moving [facility] to a more appropriate room for continued monitoring.   [facility] was evaluated in Emergency  Department on 11/29/2020 for the symptoms described in the history of present illness. [facility] was evaluated in the context of the global COVID-19 pandemic, which necessitated consideration that the [facility] might be at risk for infection with the SARS-CoV-2 virus that causes COVID-19. Institutional protocols and algorithms that pertain to the evaluation of patients at risk for COVID-19 are in a state of rapid change based on information released by regulatory bodies including the CDC and federal and state organizations. These policies and algorithms were followed during the [facility]'s care in the ED.  Final Clinical Impression(s) / ED Diagnoses Final diagnoses:  COVID-19  Hypoxia    Rx / DC Orders ED Discharge Orders    None       Lorelee New, PA-C 11/29/20 Allyn Kenner, MD 11/30/20 1410

## 2020-11-29 NOTE — ED Notes (Signed)
Pt currently in Hospital Bed. Call Bell and Bedside Commode Provided. Pt comfortable at this time and made aware of admission. RN will continue to monitor.

## 2020-11-29 NOTE — H&P (Signed)
History and Physical    [facility] [id number] DOB: Sep 08, 1971 DOA: 11/29/2020  PCP: Patient, No Pcp Per  Patient coming from: Home.  Chief Complaint: Shortness of breath.  HPI: [facility] is a 49 y.o. female with history of asthma and tobacco abuse presents to the ER with complaints of shortness of breath.  Patient states she has been short of breath for the last 4 days.  Some chest tightness denies any fever chills nausea vomiting or diarrhea.  Patient use albuterol at home.  Has received 2 Covid vaccination within the last 6 months.  ED Course: In the ER patient was hypoxic and wheezing and requiring 4 L oxygen to maintain sats.  Chest x-ray was not showing anything acute.  Patient was febrile with temperature 100.5.  Covid test came back positive.  Labs are significant for CRP of 7.1 procalcitonin was negative D-dimer was negative.  High sensitive troponins were negative.  Patient started on IV Solu-Medrol and remdesivir admitted for acute respiratory failure hypoxia likely a combination of asthma and Covid infection.  Review of Systems: As per HPI, rest all negative.   Past Medical History:  Diagnosis Date  . Asthma     Past Surgical History:  Procedure Laterality Date  . FOOT SURGERY     I+D     reports that she has been smoking cigarettes. She has been smoking about 0.25 packs per day. She has never used smokeless tobacco. She reports current alcohol use. She reports that she does not use drugs.  No Known Allergies  Family History  Problem Relation Age of Onset  . Asthma Mother     Prior to Admission medications   Medication Sig Start Date End Date Taking? Authorizing Provider  albuterol (PROVENTIL) (2.5 MG/3ML) 0.083% nebulizer solution Take 3 mLs (2.5 mg total) by nebulization every 6 (six) hours as needed for wheezing or shortness of breath. 05/30/20  Yes Jaynee Eagles, PA-C  albuterol (VENTOLIN HFA) 108 (90 Base) MCG/ACT inhaler Inhale 1-2 puffs into  the lungs every 6 (six) hours as needed for wheezing or shortness of breath. 05/30/20  Yes Jaynee Eagles, PA-C  ipratropium-albuterol (DUONEB) 0.5-2.5 (3) MG/3ML SOLN Take 3 mLs by nebulization every 4 (four) hours as needed. Patient not taking: Reported on 11/29/2020 01/05/19   Wieters, Hallie C, PA-C  ondansetron (ZOFRAN ODT) 4 MG disintegrating tablet Take 1 tablet (4 mg total) by mouth every 8 (eight) hours as needed for nausea or vomiting. Patient not taking: Reported on 11/29/2020 08/26/19   Chase Picket, MD    Physical Exam: Constitutional: Moderately built and nourished. Vitals:   11/29/20 1812 11/29/20 1900 11/29/20 1946 11/29/20 2200  BP: (!) 132/92 128/73 126/74 129/81  Pulse: (!) 105 94 (!) 105 (!) 107  Resp: (!) 24 (!) 21 (!) 25 17  Temp: 100.2 F (37.9 C)  98.6 F (37 C)   TempSrc: Oral  Oral   SpO2: 94% 98% 96% 97%   Eyes: Anicteric no pallor. ENMT: No discharge from the ears eyes nose or mouth. Neck: No mass felt.  No neck rigidity. Respiratory: Bilateral expiratory wheeze and no crepitations. Cardiovascular: S1-S2 heard. Abdomen: Soft nontender bowel sounds present. Musculoskeletal: No edema. Skin: No rash. Neurologic: Alert awake oriented to time place and person.  Moves all extremities. Psychiatric: Appears normal.  Normal affect.   Labs on Admission: I have personally reviewed following labs and imaging studies  CBC: Recent Labs  Lab 11/29/20 1015  WBC 7.7  HGB 12.0  HCT 37.4  MCV 91.7  PLT [id number]   Basic Metabolic Panel: Recent Labs  Lab 11/29/20 1015 11/29/20 1941  NA 139 137  K 3.9 3.7  CL 105 102  CO2 26 25  GLUCOSE 110* 148*  BUN 10 10  CREATININE 0.78 0.82  CALCIUM 8.8* 8.8*   GFR: CrCl cannot be calculated (Unknown ideal weight.). Liver Function Tests: Recent Labs  Lab 11/29/20 1941  AST 38  ALT 31  ALKPHOS 52  BILITOT 0.5  PROT 7.1  ALBUMIN 3.7   No results for input(s): LIPASE, AMYLASE in the last 168 hours. No results  for input(s): AMMONIA in the last 168 hours. Coagulation Profile: No results for input(s): INR, PROTIME in the last 168 hours. Cardiac Enzymes: No results for input(s): CKTOTAL, CKMB, CKMBINDEX, TROPONINI in the last 168 hours. BNP (last 3 results) No results for input(s): PROBNP in the last 8760 hours. HbA1C: No results for input(s): HGBA1C in the last 72 hours. CBG: No results for input(s): GLUCAP in the last 168 hours. Lipid Profile: Recent Labs    11/29/20 1940  TRIG 69   Thyroid Function Tests: No results for input(s): TSH, T4TOTAL, FREET4, T3FREE, THYROIDAB in the last 72 hours. Anemia Panel: No results for input(s): VITAMINB12, FOLATE, FERRITIN, TIBC, IRON, RETICCTPCT in the last 72 hours. Urine analysis:    Component Value Date/Time   COLORURINE YELLOW 10/20/2017 [place] 10/20/2017 0951   LABSPEC 1.018 10/20/2017 0951   PHURINE 5.0 10/20/2017 [place] 10/20/2017 0951   HGBUR NEGATIVE 10/20/2017 0951   BILIRUBINUR NEGATIVE 10/20/2017 0951   KETONESUR NEGATIVE 10/20/2017 0951   PROTEINUR NEGATIVE 10/20/2017 0951   UROBILINOGEN 1.0 09/27/2017 1353   NITRITE NEGATIVE 10/20/2017 0951   LEUKOCYTESUR NEGATIVE 10/20/2017 0951   Sepsis Labs: @LABRCNTIP (procalcitonin:4,lacticidven:4) ) Recent Results (from the past 240 hour(s))  Resp Panel by RT-PCR (Flu A&B, Covid) Nasopharyngeal Swab     Status: Abnormal   Collection Time: 11/29/20  3:01 PM   Specimen: Nasopharyngeal Swab; Nasopharyngeal(NP) swabs in vial transport medium  Result Value Ref Range Status   SARS Coronavirus 2 by RT PCR POSITIVE (A) NEGATIVE Final    Comment: RESULT CALLED TO, READ BACK BY AND VERIFIED WITH: [place], T RN 11/29/20 at 1743 sk  (NOTE) SARS-CoV-2 target nucleic acids are DETECTED.  The SARS-CoV-2 RNA is generally detectable in upper respiratory specimens during the acute phase of infection. Positive results are indicative of the presence of the  identified virus, but do not rule out bacterial infection or co-infection with other pathogens not detected by the test. Clinical correlation with patient history and other diagnostic information is necessary to determine patient infection status. The expected result is Negative.  Fact Sheet for Patients: EntrepreneurPulse.com.au  Fact Sheet for Healthcare Providers: IncredibleEmployment.be  This test is not yet approved or cleared by the [place] FDA and  has been authorized for detection and/or diagnosis of SARS-CoV-2 by FDA under an Emergency Use Authorization (EUA).  This EUA will remain in effect (meaning this test ca n be used) for the duration of  the COVID-19 declaration under Section 564(b)(1) of the Act, 21 U.S.C. section 360bbb-3(b)(1), unless the authorization is terminated or revoked sooner.     Influenza A by PCR NEGATIVE NEGATIVE Final   Influenza B by PCR NEGATIVE NEGATIVE Final    Comment: (NOTE) The Xpert Xpress SARS-CoV-2/FLU/RSV plus assay is intended as an aid in the diagnosis of influenza from Nasopharyngeal swab specimens and should  not be used as a sole basis for treatment. Nasal washings and aspirates are unacceptable for Xpert Xpress SARS-CoV-2/FLU/RSV testing.  Fact Sheet for Patients: BloggerCourse.com  Fact Sheet for Healthcare Providers: SeriousBroker.it  This test is not yet approved or cleared by the [place] FDA and has been authorized for detection and/or diagnosis of SARS-CoV-2 by FDA under an Emergency Use Authorization (EUA). This EUA will remain in effect (meaning this test can be used) for the duration of the COVID-19 declaration under Section 564(b)(1) of the Act, 21 U.S.C. section 360bbb-3(b)(1), unless the authorization is terminated or revoked.  Performed at [facility], [address] [place], [place] [address]       Radiological Exams on Admission: DG Chest Portable 1 View  Result Date: 11/29/2020 CLINICAL DATA:  Shortness of breath for 3 days.  Asthma. EXAM: PORTABLE CHEST 1 VIEW COMPARISON:  03/29/2018 FINDINGS: The heart size and mediastinal contours are within normal limits. Both lungs are clear. The visualized skeletal structures are unremarkable. IMPRESSION: No active disease. Electronically Signed   By: Danae Orleans M.D.   On: 11/29/2020 15:20    EKG: Independently reviewed.  Normal sinus rhythm.  Assessment/Plan Principal Problem:   Acute respiratory failure due to COVID-19 [facility]) Active Problems:   Morbid obesity (HCC)   Asthma exacerbation    1. Acute respiratory failure with hypoxia requiring 4 L oxygen to maintain sats likely combination of asthma exacerbation with Covid infection. 2. For asthma exacerbation patient has been placed on albuterol inhaler with IV Solu-Medrol.  Closely monitor respiratory status. 3. COVID-19 infection  -patient is already on IV Solu-Medrol for asthma and since patient is febrile we will place patient on remdesivir and closely monitor respiratory status. 4. History of prediabetes we will check hemoglobin A1c and since patient is on steroids we will keep patient on sliding scale coverage.  Since patient is having acute respiratory failure with Covid infection asthma exacerbation will need inpatient status.   DVT prophylaxis: Lovenox. Code Status: Full code. Family Communication: Discussed with patient. Disposition Plan: Home. Consults called: None. Admission status: Inpatient.   Eduard Clos MD [facility] Pager [phone].  If 7PM-7AM, please contact night-coverage [email or web address] Password [email or web address]  11/29/2020, 11:00 PM

## 2020-11-30 ENCOUNTER — Other Ambulatory Visit: Payer: BC Managed Care – PPO

## 2020-11-30 DIAGNOSIS — J9600 Acute respiratory failure, unspecified whether with hypoxia or hypercapnia: Secondary | ICD-10-CM | POA: Diagnosis not present

## 2020-11-30 DIAGNOSIS — U071 COVID-19: Secondary | ICD-10-CM | POA: Diagnosis not present

## 2020-11-30 LAB — COMPREHENSIVE METABOLIC PANEL
ALT: 33 U/L (ref 0–44)
AST: 38 U/L (ref 15–41)
Albumin: 3.2 g/dL — ABNORMAL LOW (ref 3.5–5.0)
Alkaline Phosphatase: 50 U/L (ref 38–126)
Anion gap: 11 (ref 5–15)
BUN: 10 mg/dL (ref 6–20)
CO2: 24 mmol/L (ref 22–32)
Calcium: 8.8 mg/dL — ABNORMAL LOW (ref 8.9–10.3)
Chloride: 102 mmol/L (ref 98–111)
Creatinine, Ser: 0.9 mg/dL (ref 0.44–1.00)
GFR, Estimated: >60 mL/min (ref >60)
Glucose, Bld: 364 mg/dL — ABNORMAL HIGH (ref 70–99)
Potassium: 4 mmol/L (ref 3.5–5.1)
Sodium: 137 mmol/L (ref 135–145)
Total Bilirubin: <0.1 mg/dL — ABNORMAL LOW (ref 0.3–1.2)
Total Protein: 6.6 g/dL (ref 6.5–8.1)

## 2020-11-30 LAB — HEMOGLOBIN A1C
Hgb A1c MFr Bld: 6.1 % — ABNORMAL HIGH (ref 4.8–5.6)
Mean Plasma Glucose: 128.37 mg/dL

## 2020-11-30 LAB — CBG MONITORING, ED
Glucose-Capillary: 291 mg/dL — ABNORMAL HIGH (ref 70–99)
Glucose-Capillary: 184 mg/dL — ABNORMAL HIGH (ref 70–99)
Glucose-Capillary: 245 mg/dL — ABNORMAL HIGH (ref 70–99)

## 2020-11-30 LAB — CBC WITH DIFFERENTIAL/PLATELET
Abs Immature Granulocytes: 0.02 10*3/uL (ref 0.00–0.07)
Basophils Absolute: 0 10*3/uL (ref 0.0–0.1)
Basophils Relative: 0 %
Eosinophils Absolute: 0 10*3/uL (ref 0.0–0.5)
Eosinophils Relative: 0 %
HCT: 35.5 % — ABNORMAL LOW (ref 36.0–46.0)
Hemoglobin: 11.1 g/dL — ABNORMAL LOW (ref 12.0–15.0)
Immature Granulocytes: 0 %
Lymphocytes Relative: 10 %
Lymphs Abs: 0.5 10*3/uL — ABNORMAL LOW (ref 0.7–4.0)
MCH: 29.2 pg (ref 26.0–34.0)
MCHC: 31.3 g/dL (ref 30.0–36.0)
MCV: 93.4 fL (ref 80.0–100.0)
Monocytes Absolute: 0.2 10*3/uL (ref 0.1–1.0)
Monocytes Relative: 3 %
Neutro Abs: 4.5 10*3/uL (ref 1.7–7.7)
Neutrophils Relative %: 87 %
Platelets: 216 10*3/uL (ref 150–400)
RBC: 3.8 MIL/uL — ABNORMAL LOW (ref 3.87–5.11)
RDW: 13.9 % (ref 11.5–15.5)
WBC: 5.2 10*3/uL (ref 4.0–10.5)
nRBC: 0 % (ref 0.0–0.2)

## 2020-11-30 LAB — TROPONIN I (HIGH SENSITIVITY)
Troponin I (High Sensitivity): 4 ng/L (ref <18)
Troponin I (High Sensitivity): 4 ng/L (ref <18)

## 2020-11-30 LAB — GLUCOSE, CAPILLARY: Glucose-Capillary: 177 mg/dL — ABNORMAL HIGH (ref 70–99)

## 2020-11-30 LAB — C-REACTIVE PROTEIN
CRP: 7.1 mg/dL — ABNORMAL HIGH (ref <1.0)
CRP: 6.3 mg/dL — ABNORMAL HIGH (ref <1.0)

## 2020-11-30 LAB — CREATININE, SERUM
Creatinine, Ser: 0.88 mg/dL (ref 0.44–1.00)
GFR, Estimated: >60 mL/min (ref >60)

## 2020-11-30 LAB — HIV ANTIBODY (ROUTINE TESTING W REFLEX): HIV Screen 4th Generation wRfx: NONREACTIVE

## 2020-11-30 LAB — D-DIMER, QUANTITATIVE: D-Dimer, Quant: 0.31 ug/mL-FEU (ref 0.00–0.50)

## 2020-11-30 LAB — FERRITIN: Ferritin: 71 ng/mL (ref 11–307)

## 2020-11-30 MED ORDER — ALBUTEROL SULFATE HFA 108 (90 BASE) MCG/ACT IN AERS
8.0000 | INHALATION_SPRAY | Freq: Four times a day (QID) | RESPIRATORY_TRACT | Status: DC
  Administered 2020-11-30 – 2020-12-03 (×12): 8 via RESPIRATORY_TRACT
  Filled 2020-11-30 (×2): qty 6.7

## 2020-11-30 MED ORDER — INSULIN ASPART 100 UNIT/ML ~~LOC~~ SOLN
0.0000 [IU] | Freq: Three times a day (TID) | SUBCUTANEOUS | Status: DC
  Administered 2020-11-30: 18:00:00 3 [IU] via SUBCUTANEOUS
  Administered 2020-11-30: 08:00:00 5 [IU] via SUBCUTANEOUS
  Administered 2020-11-30: 13:00:00 2 [IU] via SUBCUTANEOUS
  Administered 2020-12-01: 7 [IU] via SUBCUTANEOUS
  Administered 2020-12-01: 3 [IU] via SUBCUTANEOUS
  Administered 2020-12-01: 5 [IU] via SUBCUTANEOUS

## 2020-11-30 NOTE — ED Notes (Signed)
Pt sitting up at bedside, able to transport themselves to and from bedside commode. Frequent productive cough and shortness of breath with exertion.  Pt assisted to changed gown and provided new bedding \ Call light within reach and pt denies further needs at this time

## 2020-11-30 NOTE — ED Notes (Signed)
Assumed care of patient. Patient resting on hospital bed in poc. Cardiac, bp, and pulse ox monitoring in place. Oxygen via nasal cannula at 3L/min in place. Tolerating well. Medicated as ordered. Call bell and belongings within reach. Updated on plan of care. Denies needs at this time. Will continue to monitor.

## 2020-11-30 NOTE — Progress Notes (Signed)
Jasmine Heath  [id number] DOB: [birth year] DOA: 11/29/2020 PCP: Patient, No Pcp Per    Brief Narrative:  49 year old with a history of asthma and ongoing tobacco abuse who presented to the ER with shortness of breath for 4 days accompanied by intermittent chest tightness unresponsive to albuterol use at home.  In the ER she was found to be hypoxic at 84% on room air requiring 4 L nasal cannula support to recover, and wheezing.  CXR was without focal infiltrate.  She was found to be Covid positive.  IV Solu-Medrol and Remdesivir were initiated.  Significant Events:  12/29 admit via [facility] ED  Date of Positive COVID Test:  12/29  Vaccination Status: Vaccine x 2 - no booster  COVID-19 specific Treatment: Solu-Medrol 12/29 > Remdesivir 12/29 >  Antimicrobials:  None  DVT prophylaxis: Lovenox  Subjective: The patient feels that her breathing is improving slightly.  She denies chest pain nausea vomiting or abdominal pain.  Overall she states she is better than when she first came to the emergency room.  Assessment & Plan:  COVID Pneumonia - acute hypoxic respiratory failure Saturations stabilizing on 4 L conventional nasal cannula -continue steroid plus Remdesivir -discussed use of immunomodulators with patient but presently no indication for escalating treatment  Recent Labs  Lab 11/29/20 1941 11/30/20 0628  DDIMER 0.41 0.31  FERRITIN 71  --   CRP 7.1* 6.3*  ALT 31 3  PROCALCITON <0.10  --      Asthma with acute exacerbation Patient feels her wheezing has improved since her admission -she is not in extremis at the time of my visit  History of prediabetes A1c 6.1 this admit -monitor CBGs with ongoing use of steroid   Code Status: FULL CODE Family Communication: No family present at time of exam Status is: Inpatient  Remains inpatient appropriate because:Inpatient level of care appropriate due to severity of illness   Dispo: The patient is from: Home               Anticipated d/c is to: Home              Anticipated d/c date is: 3 days              Patient currently is not medically stable to d/c.   Consultants:  none  Objective: Blood pressure 118/69, pulse (!) 102, temperature 98.1 F (36.7 C), temperature source Oral, resp. rate (!) 29, last menstrual period 11/05/2020, SpO2 96 %.  Intake/Output Summary (Last 24 hours) at 11/30/2020 0726 Last data filed at 11/30/2020 0245 Gross per 24 hour  Intake 245 ml  Output --  Net 245 ml   There were no vitals filed for this visit.  Examination: General: No acute respiratory distress - alert and able to carry on a conversation  Lungs: mild exp wheezing audible  Cardiovascular: Regular rate and rhythm  Abdomen: overweight, soft Extremities: No signif edema bilateral lower extremities  CBC: Recent Labs  Lab 11/29/20 1015 11/30/20 0628  WBC 7.7 5.2  NEUTROABS  --  4.5  HGB 12.0 11.1*  HCT 37.4 35.5*  MCV 91.7 93.4  PLT 247 [id number]   Basic Metabolic Panel: Recent Labs  Lab 11/29/20 1015 11/29/20 1941 11/29/20 2346 11/30/20 0628  NA 139 137  --  137  K 3.9 3.7  --  4.0  CL 105 102  --  102  CO2 26 25  --  24  GLUCOSE 110* 148*  --  364*  BUN 10 10  --  10  CREATININE 0.78 0.82 0.88 0.90  CALCIUM 8.8* 8.8*  --  8.8*   GFR: CrCl cannot be calculated (Unknown ideal weight.).  Liver Function Tests: Recent Labs  Lab 11/29/20 1941 11/30/20 0628  AST 38 38  ALT 31 33  ALKPHOS 52 50  BILITOT 0.5 <0.1*  PROT 7.1 6.6  ALBUMIN 3.7 3.2*    HbA1C: Hgb A1c MFr Bld  Date/Time Value Ref Range Status  11/30/2020 06:29 AM 6.1 (H) 4.8 - 5.6 % Final    Comment:    (NOTE) Pre diabetes:          5.7%-6.4%  Diabetes:              >6.4%  Glycemic control for   <7.0% adults with diabetes   11/16/2012 04:05 AM 6.4 (H) <5.7 % Final    Comment:    (NOTE)                                                                       According to the ADA Clinical Practice Recommendations  for 2011, when HbA1c is used as a screening test:  >=6.5%   Diagnostic of Diabetes Mellitus           (if abnormal result is confirmed) 5.7-6.4%   Increased risk of developing Diabetes Mellitus References:Diagnosis and Classification of Diabetes Mellitus,Diabetes Care,2011,34(Suppl 1):S62-S69 and Standards of Medical Care in         Diabetes - 2011,Diabetes Care,2011,34 (Suppl 1):S11-S61.    CBG: No results for input(s): GLUCAP in the last 168 hours.  Recent Results (from the past 240 hour(s))  Resp Panel by RT-PCR (Flu A&B, Covid) Nasopharyngeal Swab     Status: Abnormal   Collection Time: 11/29/20  3:01 PM   Specimen: Nasopharyngeal Swab; Nasopharyngeal(NP) swabs in vial transport medium  Result Value Ref Range Status   SARS Coronavirus 2 by RT PCR POSITIVE (A) NEGATIVE Final    Comment: RESULT CALLED TO, READ BACK BY AND VERIFIED WITH: Shropshire, T RN 11/29/20 at 1743 sk  (NOTE) SARS-CoV-2 target nucleic acids are DETECTED.  The SARS-CoV-2 RNA is generally detectable in upper respiratory specimens during the acute phase of infection. Positive results are indicative of the presence of the identified virus, but do not rule out bacterial infection or co-infection with other pathogens not detected by the test. Clinical correlation with patient history and other diagnostic information is necessary to determine patient infection status. The expected result is Negative.  Fact Sheet for Patients: BloggerCourse.com  Fact Sheet for Healthcare Providers: SeriousBroker.it  This test is not yet approved or cleared by the [place] FDA and  has been authorized for detection and/or diagnosis of SARS-CoV-2 by FDA under an Emergency Use Authorization (EUA).  This EUA will remain in effect (meaning this test ca n be used) for the duration of  the COVID-19 declaration under Section 564(b)(1) of the Act, 21 U.S.C. section 360bbb-3(b)(1),  unless the authorization is terminated or revoked sooner.     Influenza A by PCR NEGATIVE NEGATIVE Final   Influenza B by PCR NEGATIVE NEGATIVE Final    Comment: (NOTE) The Xpert Xpress SARS-CoV-2/FLU/RSV plus assay is intended as an aid in the diagnosis of influenza from Nasopharyngeal swab specimens and  should not be used as a sole basis for treatment. Nasal washings and aspirates are unacceptable for Xpert Xpress SARS-CoV-2/FLU/RSV testing.  Fact Sheet for Patients: EntrepreneurPulse.com.au  Fact Sheet for Healthcare Providers: IncredibleEmployment.be  This test is not yet approved or cleared by the [place] FDA and has been authorized for detection and/or diagnosis of SARS-CoV-2 by FDA under an Emergency Use Authorization (EUA). This EUA will remain in effect (meaning this test can be used) for the duration of the COVID-19 declaration under Section 564(b)(1) of the Act, 21 U.S.C. section 360bbb-3(b)(1), unless the authorization is terminated or revoked.  Performed at [facility], [place] [address] [place], [place] [address]   Blood Culture (routine x 2)     Status: None (Preliminary result)   Collection Time: 11/29/20  7:41 PM   Specimen: BLOOD LEFT HAND  Result Value Ref Range Status   Specimen Description BLOOD LEFT HAND  Final   Special Requests   Final    BOTTLES DRAWN AEROBIC AND ANAEROBIC Blood Culture results may not be optimal due to an inadequate volume of blood received in culture bottles   Culture   Final    NO GROWTH < 12 HOURS Performed at [facility], [place] [address], [place], [place] [address]    Report Status PENDING  Incomplete  Blood Culture (routine x 2)     Status: None (Preliminary result)   Collection Time: 11/29/20  7:41 PM   Specimen: BLOOD  Result Value Ref Range Status   Specimen Description BLOOD LEFT ANTECUBITAL  Final   Special Requests   Final    BOTTLES DRAWN AEROBIC AND ANAEROBIC  Blood Culture adequate volume   Culture   Final    NO GROWTH < 12 HOURS Performed at [facility], [place] [address] [place], [place] [address]    Report Status PENDING  Incomplete     Scheduled Meds: . acetaminophen  650 mg Oral Once  . albuterol  8 puff Inhalation Q4H  . vitamin C  500 mg Oral Daily  . enoxaparin (LOVENOX) injection  40 mg Subcutaneous Daily  . insulin aspart  0-9 Units Subcutaneous TID WC  . methylPREDNISolone (SOLU-MEDROL) injection  60 mg Intravenous BID   Followed by  . [START ON 12/03/2020] predniSONE  50 mg Oral Daily  . zinc sulfate  220 mg Oral Daily   Continuous Infusions: . remdesivir 100 mg in NS 100 mL       LOS: 1 day   Cherene Altes, MD [facility] Office  [phone] Pager - Text Page per Amion  If 7PM-7AM, please contact night-coverage per Amion 11/30/2020, 7:26 AM

## 2020-11-30 NOTE — ED Notes (Signed)
Report called to inpatient unit. Awaiting transport.

## 2020-12-01 ENCOUNTER — Inpatient Hospital Stay (HOSPITAL_COMMUNITY): Payer: BC Managed Care – PPO

## 2020-12-01 DIAGNOSIS — J9600 Acute respiratory failure, unspecified whether with hypoxia or hypercapnia: Secondary | ICD-10-CM | POA: Diagnosis not present

## 2020-12-01 DIAGNOSIS — U071 COVID-19: Secondary | ICD-10-CM | POA: Diagnosis not present

## 2020-12-01 LAB — CBC WITH DIFFERENTIAL/PLATELET
Abs Immature Granulocytes: 0.05 10*3/uL (ref 0.00–0.07)
Basophils Absolute: 0 10*3/uL (ref 0.0–0.1)
Basophils Relative: 0 %
Eosinophils Absolute: 0 10*3/uL (ref 0.0–0.5)
Eosinophils Relative: 0 %
HCT: 34.3 % — ABNORMAL LOW (ref 36.0–46.0)
Hemoglobin: 10.9 g/dL — ABNORMAL LOW (ref 12.0–15.0)
Immature Granulocytes: 1 %
Lymphocytes Relative: 12 %
Lymphs Abs: 1 10*3/uL (ref 0.7–4.0)
MCH: 28.9 pg (ref 26.0–34.0)
MCHC: 31.8 g/dL (ref 30.0–36.0)
MCV: 91 fL (ref 80.0–100.0)
Monocytes Absolute: 0.4 10*3/uL (ref 0.1–1.0)
Monocytes Relative: 5 %
Neutro Abs: 6.8 10*3/uL (ref 1.7–7.7)
Neutrophils Relative %: 82 %
Platelets: 208 10*3/uL (ref 150–400)
RBC: 3.77 MIL/uL — ABNORMAL LOW (ref 3.87–5.11)
RDW: 13.9 % (ref 11.5–15.5)
WBC: 8.2 10*3/uL (ref 4.0–10.5)
nRBC: 0 % (ref 0.0–0.2)

## 2020-12-01 LAB — COMPREHENSIVE METABOLIC PANEL
ALT: 32 U/L (ref 0–44)
AST: 25 U/L (ref 15–41)
Albumin: 3.2 g/dL — ABNORMAL LOW (ref 3.5–5.0)
Alkaline Phosphatase: 49 U/L (ref 38–126)
Anion gap: 10 (ref 5–15)
BUN: 10 mg/dL (ref 6–20)
CO2: 26 mmol/L (ref 22–32)
Calcium: 8.7 mg/dL — ABNORMAL LOW (ref 8.9–10.3)
Chloride: 101 mmol/L (ref 98–111)
Creatinine, Ser: 0.8 mg/dL (ref 0.44–1.00)
GFR, Estimated: >60 mL/min (ref >60)
Glucose, Bld: 304 mg/dL — ABNORMAL HIGH (ref 70–99)
Potassium: 4 mmol/L (ref 3.5–5.1)
Sodium: 137 mmol/L (ref 135–145)
Total Bilirubin: 0.3 mg/dL (ref 0.3–1.2)
Total Protein: 6.8 g/dL (ref 6.5–8.1)

## 2020-12-01 LAB — GLUCOSE, CAPILLARY
Glucose-Capillary: 230 mg/dL — ABNORMAL HIGH (ref 70–99)
Glucose-Capillary: 256 mg/dL — ABNORMAL HIGH (ref 70–99)
Glucose-Capillary: 343 mg/dL — ABNORMAL HIGH (ref 70–99)
Glucose-Capillary: 276 mg/dL — ABNORMAL HIGH (ref 70–99)

## 2020-12-01 LAB — D-DIMER, QUANTITATIVE: D-Dimer, Quant: 0.32 ug/mL-FEU (ref 0.00–0.50)

## 2020-12-01 LAB — C-REACTIVE PROTEIN: CRP: 4 mg/dL — ABNORMAL HIGH (ref <1.0)

## 2020-12-01 MED ORDER — INSULIN ASPART 100 UNIT/ML ~~LOC~~ SOLN
0.0000 [IU] | Freq: Every day | SUBCUTANEOUS | Status: DC
  Administered 2020-12-01: 3 [IU] via SUBCUTANEOUS
  Administered 2020-12-02: 2 [IU] via SUBCUTANEOUS

## 2020-12-01 MED ORDER — INSULIN ASPART 100 UNIT/ML ~~LOC~~ SOLN
0.0000 [IU] | Freq: Three times a day (TID) | SUBCUTANEOUS | Status: DC
  Administered 2020-12-02 (×3): 8 [IU] via SUBCUTANEOUS
  Administered 2020-12-03: 3 [IU] via SUBCUTANEOUS
  Administered 2020-12-03 (×2): 8 [IU] via SUBCUTANEOUS

## 2020-12-01 MED ORDER — ENOXAPARIN SODIUM 30 MG/0.3ML ~~LOC~~ SOLN
30.0000 mg | Freq: Once | SUBCUTANEOUS | Status: AC
  Administered 2020-12-01: 30 mg via SUBCUTANEOUS
  Filled 2020-12-01: qty 0.3

## 2020-12-01 MED ORDER — INSULIN ASPART 100 UNIT/ML ~~LOC~~ SOLN
3.0000 [IU] | Freq: Three times a day (TID) | SUBCUTANEOUS | Status: DC
  Administered 2020-12-02 – 2020-12-03 (×6): 3 [IU] via SUBCUTANEOUS

## 2020-12-01 MED ORDER — ENOXAPARIN SODIUM 60 MG/0.6ML ~~LOC~~ SOLN
60.0000 mg | Freq: Every day | SUBCUTANEOUS | Status: DC
  Administered 2020-12-02 – 2020-12-03 (×2): 60 mg via SUBCUTANEOUS
  Filled 2020-12-01 (×2): qty 0.6

## 2020-12-01 NOTE — Plan of Care (Signed)
  Problem: Respiratory: Goal: Will maintain a patent airway Outcome: Progressing Goal: Complications related to the disease process, condition or treatment will be avoided or minimized Outcome: Progressing   

## 2020-12-01 NOTE — Progress Notes (Signed)
Due to BMI 44, ok to increase lovenox to 0.5mg /kg/day per Dr. Sharon Seller.  Ulyses Southward, PharmD, BCIDP, AAHIVP, CPP Infectious Disease Pharmacist 12/01/2020 11:36 AM

## 2020-12-01 NOTE — Plan of Care (Signed)
  Problem: Education: Goal: Knowledge of risk factors and measures for prevention of condition will improve Outcome: Progressing   Problem: Coping: Goal: Psychosocial and spiritual needs will be supported Outcome: Progressing   Problem: Respiratory: Goal: Will maintain a patent airway Outcome: Progressing Goal: Complications related to the disease process, condition or treatment will be avoided or minimized Outcome: Progressing   

## 2020-12-01 NOTE — Progress Notes (Signed)
Results for DILAN, FULLENWIDER (MRN [id number]) as of 12/01/2020 13:15  Ref. Range 11/30/2020 12:43 11/30/2020 16:56 11/30/2020 21:57 12/01/2020 09:08 12/01/2020 12:01  Glucose-Capillary Latest Ref Range: 70 - 99 mg/dL 882 (H) 800 (H) 349 (H) 230 (H) 256 (H)  Noted that blood sugars continue to be greater than 180 mg/dl.  Recommend increasing Novolog to MODERATE correction scale TID & HS scale (0-5 units). Titrate dosages as needed especially while on steroids. May need to add Novolog meal coverage if blood sugars continue to be elevated.   Smith Mince RN BSN CDE Diabetes Coordinator Pager: [phone]  8am-5pm

## 2020-12-01 NOTE — Progress Notes (Addendum)
Jasmine Heath  [id number] DOB: [birth year] DOA: 11/29/2020 PCP: Patient, No Pcp Per    Brief Narrative:  49 year old with a history of asthma and ongoing tobacco abuse who presented to the ER with shortness of breath for 4 days accompanied by intermittent chest tightness unresponsive to albuterol use at home.  In the ER she was found to be hypoxic at 84% on room air requiring 4 L nasal cannula support to recover, and wheezing.  CXR was without focal infiltrate.  She was found to be Covid positive.  IV Solu-Medrol and Remdesivir were initiated.  Significant Events:  12/29 admit via [facility] ED  Date of Positive COVID Test:  12/29  Vaccination Status: Vaccine x 2 - no booster  COVID-19 specific Treatment: Solu-Medrol 12/29 > Remdesivir 12/29 >  Antimicrobials:  None  DVT prophylaxis: Lovenox  Subjective: Afebrile.  Vital signs stable.  Only requiring 2 L nasal cannula to keep saturations at 97%.  Ferritin and D-dimer not elevated and CRP, which was modestly elevated, trending downward.  Follow-up CXR, which I have personally reviewed, reveals bilateral pulmonary infiltrates today.  The patient states she is feeling significantly better today.  She has noticed much less wheezing.  She denies chest pain nausea vomiting or abdominal pain.  Assessment & Plan:  COVID Pneumonia - acute hypoxic respiratory failure Saturations stabilizing and some progress being made in weaning oxygen already - continue steroid plus Remdesivir -discussed use of immunomodulators with patient but presently no indication for escalating treatment  Recent Labs  Lab 11/29/20 1941 11/30/20 0628 12/01/20 0147  DDIMER 0.41 0.31 0.32  FERRITIN 71  --   --   CRP 7.1* 6.3* 4.0*  ALT 31 33 48  PROCALCITON <0.10  --   --      Asthma with acute exacerbation Mild wheezing persist the time of exam today but overall is much improved - she is not in extremis at the time of my visit  History of  prediabetes A1c 6.1 this admit -adjust treatment with climbing CBGs in the setting of steroid therapy  Morbid obesity - Body mass index is 43.48 kg/m.   Code Status: FULL CODE Family Communication: No family present at time of exam Status is: Inpatient  Remains inpatient appropriate because:Inpatient level of care appropriate due to severity of illness   Dispo: The patient is from: Home              Anticipated d/c is to: Home              Anticipated d/c date is: 3 days              Patient currently is not medically stable to d/c.   Consultants:  none  Objective: Blood pressure 114/70, pulse 72, temperature 98 F (36.7 C), temperature source Oral, resp. rate (!) 22, height 5\' 6"  (1.676 m), weight 122.2 kg, last menstrual period 11/05/2020, SpO2 97 %.  Intake/Output Summary (Last 24 hours) at 12/01/2020 0943 Last data filed at 12/01/2020 0500 Gross per 24 hour  Intake 463.33 ml  Output 400 ml  Net 63.33 ml   Filed Weights   11/30/20 2140  Weight: 122.2 kg    Examination: General: No acute respiratory distress - alert and conversant Lungs: mild exp wheezing audible but improved since yesterday with no focal crackles Cardiovascular: Regular rate and rhythm without murmur Abdomen: overweight, soft, bowel sounds positive Extremities: No signif edema B LE   CBC: Recent Labs  Lab 11/29/20 1015 11/30/20 [id number]  12/01/20 0147  WBC 7.7 5.2 8.2  NEUTROABS  --  4.5 6.8  HGB 12.0 11.1* 10.9*  HCT 37.4 35.5* 34.3*  MCV 91.7 93.4 91.0  PLT 247 216 208   Basic Metabolic Panel: Recent Labs  Lab 11/29/20 1941 11/29/20 2346 11/30/20 0628 12/01/20 0147  NA 137  --  137 137  K 3.7  --  4.0 4.0  CL 102  --  102 101  CO2 25  --  24 26  GLUCOSE 148*  --  364* 304*  BUN 10  --  10 10  CREATININE 0.82 0.88 0.90 0.80  CALCIUM 8.8*  --  8.8* 8.7*   GFR: Estimated Creatinine Clearance: 116 mL/min (by C-G formula based on SCr of 0.8 mg/dL).  Liver Function  Tests: Recent Labs  Lab 11/29/20 1941 11/30/20 0628 12/01/20 0147  AST 38 38 25  ALT 31 33 32  ALKPHOS 52 50 49  BILITOT 0.5 <0.1* 0.3  PROT 7.1 6.6 6.8  ALBUMIN 3.7 3.2* 3.2*    HbA1C: Hgb A1c MFr Bld  Date/Time Value Ref Range Status  11/30/2020 06:29 AM 6.1 (H) 4.8 - 5.6 % Final    Comment:    (NOTE) Pre diabetes:          5.7%-6.4%  Diabetes:              >6.4%  Glycemic control for   <7.0% adults with diabetes   11/16/2012 04:05 AM 6.4 (H) <5.7 % Final    Comment:    (NOTE)                                                                       According to the ADA Clinical Practice Recommendations for 2011, when HbA1c is used as a screening test:  >=6.5%   Diagnostic of Diabetes Mellitus           (if abnormal result is confirmed) 5.7-6.4%   Increased risk of developing Diabetes Mellitus References:Diagnosis and Classification of Diabetes Mellitus,Diabetes Care,2011,34(Suppl 1):S62-S69 and Standards of Medical Care in         Diabetes - 2011,Diabetes Care,2011,34 (Suppl 1):S11-S61.    CBG: Recent Labs  Lab 11/30/20 0747 11/30/20 1243 11/30/20 1656 11/30/20 2157 12/01/20 0908  GLUCAP 291* 184* 245* 177* 230*    Recent Results (from the past 240 hour(s))  Resp Panel by RT-PCR (Flu A&B, Covid) Nasopharyngeal Swab     Status: Abnormal   Collection Time: 11/29/20  3:01 PM   Specimen: Nasopharyngeal Swab; Nasopharyngeal(NP) swabs in vial transport medium  Result Value Ref Range Status   SARS Coronavirus 2 by RT PCR POSITIVE (A) NEGATIVE Final    Comment: RESULT CALLED TO, READ BACK BY AND VERIFIED WITH: Shropshire, T RN 11/29/20 at 1743 sk  (NOTE) SARS-CoV-2 target nucleic acids are DETECTED.  The SARS-CoV-2 RNA is generally detectable in upper respiratory specimens during the acute phase of infection. Positive results are indicative of the presence of the identified virus, but do not rule out bacterial infection or co-infection with other pathogens  not detected by the test. Clinical correlation with patient history and other diagnostic information is necessary to determine patient infection status. The expected result is Negative.  Fact Sheet for Patients: BloggerCourse.com  Fact Sheet for Healthcare Providers: IncredibleEmployment.be  This test is not yet approved or cleared by the [place] FDA and  has been authorized for detection and/or diagnosis of SARS-CoV-2 by FDA under an Emergency Use Authorization (EUA).  This EUA will remain in effect (meaning this test ca n be used) for the duration of  the COVID-19 declaration under Section 564(b)(1) of the Act, 21 U.S.C. section 360bbb-3(b)(1), unless the authorization is terminated or revoked sooner.     Influenza A by PCR NEGATIVE NEGATIVE Final   Influenza B by PCR NEGATIVE NEGATIVE Final    Comment: (NOTE) The Xpert Xpress SARS-CoV-2/FLU/RSV plus assay is intended as an aid in the diagnosis of influenza from Nasopharyngeal swab specimens and should not be used as a sole basis for treatment. Nasal washings and aspirates are unacceptable for Xpert Xpress SARS-CoV-2/FLU/RSV testing.  Fact Sheet for Patients: EntrepreneurPulse.com.au  Fact Sheet for Healthcare Providers: IncredibleEmployment.be  This test is not yet approved or cleared by the [place] FDA and has been authorized for detection and/or diagnosis of SARS-CoV-2 by FDA under an Emergency Use Authorization (EUA). This EUA will remain in effect (meaning this test can be used) for the duration of the COVID-19 declaration under Section 564(b)(1) of the Act, 21 U.S.C. section 360bbb-3(b)(1), unless the authorization is terminated or revoked.  Performed at [facility], [place] [address], [place], [place] [address]   Blood Culture (routine x 2)     Status: None (Preliminary result)   Collection Time: 11/29/20  7:41 PM    Specimen: BLOOD LEFT HAND  Result Value Ref Range Status   Specimen Description BLOOD LEFT HAND  Final   Special Requests   Final    BOTTLES DRAWN AEROBIC AND ANAEROBIC Blood Culture results may not be optimal due to an inadequate volume of blood received in culture bottles   Culture   Final    NO GROWTH 2 DAYS Performed at [facility], [place] [address], [place], [place] [address]    Report Status PENDING  Incomplete  Blood Culture (routine x 2)     Status: None (Preliminary result)   Collection Time: 11/29/20  7:41 PM   Specimen: BLOOD  Result Value Ref Range Status   Specimen Description BLOOD LEFT ANTECUBITAL  Final   Special Requests   Final    BOTTLES DRAWN AEROBIC AND ANAEROBIC Blood Culture adequate volume   Culture   Final    NO GROWTH 2 DAYS Performed at [facility], [place] [address] [place], [place] [address]    Report Status PENDING  Incomplete     Scheduled Meds: . albuterol  8 puff Inhalation QID  . vitamin C  500 mg Oral Daily  . enoxaparin (LOVENOX) injection  40 mg Subcutaneous Daily  . insulin aspart  0-9 Units Subcutaneous TID WC  . methylPREDNISolone (SOLU-MEDROL) injection  60 mg Intravenous BID   Followed by  . [START ON 12/03/2020] predniSONE  50 mg Oral Daily  . zinc sulfate  220 mg Oral Daily   Continuous Infusions: . remdesivir 100 mg in NS 100 mL 100 mg (12/01/20 0920)     LOS: 2 days   Cherene Altes, MD [facility] Office  [phone] Pager - Text Page per Shea Evans  If 7PM-7AM, please contact night-coverage per Amion 12/01/2020, 9:43 AM

## 2020-12-02 DIAGNOSIS — J9600 Acute respiratory failure, unspecified whether with hypoxia or hypercapnia: Secondary | ICD-10-CM | POA: Diagnosis not present

## 2020-12-02 DIAGNOSIS — U071 COVID-19: Secondary | ICD-10-CM | POA: Diagnosis not present

## 2020-12-02 LAB — COMPREHENSIVE METABOLIC PANEL
ALT: 32 U/L (ref 0–44)
AST: 18 U/L (ref 15–41)
Albumin: 3.2 g/dL — ABNORMAL LOW (ref 3.5–5.0)
Alkaline Phosphatase: 52 U/L (ref 38–126)
Anion gap: 9 (ref 5–15)
BUN: 10 mg/dL (ref 6–20)
CO2: 26 mmol/L (ref 22–32)
Calcium: 8.6 mg/dL — ABNORMAL LOW (ref 8.9–10.3)
Chloride: 98 mmol/L (ref 98–111)
Creatinine, Ser: 0.68 mg/dL (ref 0.44–1.00)
GFR, Estimated: >60 mL/min (ref >60)
Glucose, Bld: 297 mg/dL — ABNORMAL HIGH (ref 70–99)
Potassium: 4.4 mmol/L (ref 3.5–5.1)
Sodium: 133 mmol/L — ABNORMAL LOW (ref 135–145)
Total Bilirubin: <0.1 mg/dL — ABNORMAL LOW (ref 0.3–1.2)
Total Protein: 6.8 g/dL (ref 6.5–8.1)

## 2020-12-02 LAB — D-DIMER, QUANTITATIVE: D-Dimer, Quant: <0.27 ug/mL-FEU (ref 0.00–0.50)

## 2020-12-02 LAB — CBC WITH DIFFERENTIAL/PLATELET
Abs Immature Granulocytes: 0.07 10*3/uL (ref 0.00–0.07)
Basophils Absolute: 0 10*3/uL (ref 0.0–0.1)
Basophils Relative: 0 %
Eosinophils Absolute: 0 10*3/uL (ref 0.0–0.5)
Eosinophils Relative: 0 %
HCT: 36.3 % (ref 36.0–46.0)
Hemoglobin: 11.7 g/dL — ABNORMAL LOW (ref 12.0–15.0)
Immature Granulocytes: 1 %
Lymphocytes Relative: 13 %
Lymphs Abs: 1.3 10*3/uL (ref 0.7–4.0)
MCH: 29 pg (ref 26.0–34.0)
MCHC: 32.2 g/dL (ref 30.0–36.0)
MCV: 90.1 fL (ref 80.0–100.0)
Monocytes Absolute: 0.4 10*3/uL (ref 0.1–1.0)
Monocytes Relative: 4 %
Neutro Abs: 8.3 10*3/uL — ABNORMAL HIGH (ref 1.7–7.7)
Neutrophils Relative %: 82 %
Platelets: 231 10*3/uL (ref 150–400)
RBC: 4.03 MIL/uL (ref 3.87–5.11)
RDW: 13.6 % (ref 11.5–15.5)
WBC: 10.1 10*3/uL (ref 4.0–10.5)
nRBC: 0 % (ref 0.0–0.2)

## 2020-12-02 LAB — GLUCOSE, CAPILLARY
Glucose-Capillary: 271 mg/dL — ABNORMAL HIGH (ref 70–99)
Glucose-Capillary: 284 mg/dL — ABNORMAL HIGH (ref 70–99)
Glucose-Capillary: 235 mg/dL — ABNORMAL HIGH (ref 70–99)

## 2020-12-02 LAB — C-REACTIVE PROTEIN: CRP: 2.1 mg/dL — ABNORMAL HIGH (ref <1.0)

## 2020-12-02 NOTE — Progress Notes (Signed)
Jasmine Heath  [id number] DOB: 11-12-1971 DOA: 11/29/2020 PCP: Patient, No Pcp Per    Brief Narrative:  50 year old with a history of asthma and ongoing tobacco abuse who presented to the ER with shortness of breath for 4 days accompanied by intermittent chest tightness unresponsive to albuterol use at home.  In the ER she was found to be hypoxic at 84% on room air requiring 4 L nasal cannula support to recover, and wheezing.  CXR was without focal infiltrate.  She was found to be Covid positive.  IV Solu-Medrol and Remdesivir were initiated.  Significant Events:  12/29 admit via [facility] ED  Date of Positive COVID Test:  12/29  Vaccination Status: Vaccine x 2 - no booster  COVID-19 specific Treatment: Solu-Medrol 12/29 > Remdesivir 12/29 >  Antimicrobials:  None  DVT prophylaxis: Lovenox  Subjective: Afebrile with vital signs stable.  Saturations 92-94% on 2 L nasal cannula support.  Feels that she is making progress each day.  Is hopeful to go home soon.  Denies chest pain nausea vomiting or diarrhea.  Has a good appetite.  Assessment & Plan:  COVID Pneumonia - acute hypoxic respiratory failure continue steroid plus Remdesivir -discussed use of immunomodulators with patient but presently no indication for escalating treatment -ambulate and assess saturations with exertion -wean to room air as possible -hopeful for discharge home 1/2, even if transient use of home O2 necessary  Recent Labs  Lab 11/29/20 1941 11/30/20 0628 12/01/20 0147 12/02/20 0523  DDIMER 0.41 0.31 0.32 <0.27  FERRITIN 71  --   --   --   CRP 7.1* 6.3* 4.0* 2.1*  ALT 31 33 39 32  PROCALCITON <0.10  --   --   --      Asthma with acute exacerbation Essentially at baseline presently with only minimal intermittent wheezing and good air movement throughout  History of prediabetes A1c 6.1 this admit -adjust treatment with climbing CBGs in the setting of steroid therapy  Morbid obesity - Body mass  index is 43.48 kg/m.   Code Status: FULL CODE Family Communication: No family present at time of exam Status is: Inpatient  Remains inpatient appropriate because:Inpatient level of care appropriate due to severity of illness   Dispo: The patient is from: Home              Anticipated d/c is to: Home              Anticipated d/c date is: 3 days              Patient currently is not medically stable to d/c.   Consultants:  none  Objective: Blood pressure 135/79, pulse 72, temperature 98.5 F (36.9 C), temperature source Oral, resp. rate 19, height 5\' 6"  (1.676 m), weight 122.2 kg, last menstrual period 11/05/2020, SpO2 92 %.  Intake/Output Summary (Last 24 hours) at 12/02/2020 0948 Last data filed at 12/02/2020 0900 Gross per 24 hour  Intake 608.6 ml  Output 300 ml  Net 308.6 ml   Filed Weights   11/30/20 2140  Weight: 122.2 kg    Examination: General: No acute respiratory distress - alert/conversant Lungs: Minimal scattered expiratory wheezing with good air movement throughout Cardiovascular: RRR Abdomen: overweight, soft, bowel sounds positive Extremities: No signif edema B LE   CBC: Recent Labs  Lab 11/30/20 0628 12/01/20 0147 12/02/20 0523  WBC 5.2 8.2 10.1  NEUTROABS 4.5 6.8 8.3*  HGB 11.1* 10.9* 11.7*  HCT 35.5* 34.3* 36.3  MCV 93.4 91.0  90.1  PLT 216 208 231   Basic Metabolic Panel: Recent Labs  Lab 11/30/20 0628 12/01/20 0147 12/02/20 0523  NA 137 137 133*  K 4.0 4.0 4.4  CL 102 101 98  CO2 24 26 26   GLUCOSE 364* 304* 297*  BUN 10 10 10   CREATININE 0.90 0.80 0.68  CALCIUM 8.8* 8.7* 8.6*   GFR: Estimated Creatinine Clearance: 116 mL/min (by C-G formula based on SCr of 0.68 mg/dL).  Liver Function Tests: Recent Labs  Lab 11/29/20 1941 11/30/20 0628 12/01/20 0147 12/02/20 0523  AST 38 38 25 18  ALT 31 33 32 32  ALKPHOS 52 50 49 52  BILITOT 0.5 <0.1* 0.3 <0.1*  PROT 7.1 6.6 6.8 6.8  ALBUMIN 3.7 3.2* 3.2* 3.2*    HbA1C: Hgb A1c  MFr Bld  Date/Time Value Ref Range Status  11/30/2020 06:29 AM 6.1 (H) 4.8 - 5.6 % Final    Comment:    (NOTE) Pre diabetes:          5.7%-6.4%  Diabetes:              >6.4%  Glycemic control for   <7.0% adults with diabetes   11/16/2012 04:05 AM 6.4 (H) <5.7 % Final    Comment:    (NOTE)                                                                       According to the ADA Clinical Practice Recommendations for 2011, when HbA1c is used as a screening test:  >=6.5%   Diagnostic of Diabetes Mellitus           (if abnormal result is confirmed) 5.7-6.4%   Increased risk of developing Diabetes Mellitus References:Diagnosis and Classification of Diabetes Mellitus,Diabetes Care,2011,34(Suppl 1):S62-S69 and Standards of Medical Care in         Diabetes - 2011,Diabetes Care,2011,34 (Suppl 1):S11-S61.    CBG: Recent Labs  Lab 11/30/20 2157 12/01/20 0908 12/01/20 1201 12/01/20 1647 12/01/20 2018  GLUCAP 177* 230* 256* 343* 276*    Recent Results (from the past 240 hour(s))  Resp Panel by RT-PCR (Flu A&B, Covid) Nasopharyngeal Swab     Status: Abnormal   Collection Time: 11/29/20  3:01 PM   Specimen: Nasopharyngeal Swab; Nasopharyngeal(NP) swabs in vial transport medium  Result Value Ref Range Status   SARS Coronavirus 2 by RT PCR POSITIVE (A) NEGATIVE Final    Comment: RESULT CALLED TO, READ BACK BY AND VERIFIED WITH: Shropshire, T RN 11/29/20 at 1743 sk  (NOTE) SARS-CoV-2 target nucleic acids are DETECTED.  The SARS-CoV-2 RNA is generally detectable in upper respiratory specimens during the acute phase of infection. Positive results are indicative of the presence of the identified virus, but do not rule out bacterial infection or co-infection with other pathogens not detected by the test. Clinical correlation with patient history and other diagnostic information is necessary to determine patient infection status. The expected result is Negative.  Fact Sheet for  Patients: 12/01/20  Fact Sheet for Healthcare Providers: 12/01/20  This test is not yet approved or cleared by the [employer] FDA and  has been authorized for detection and/or diagnosis of SARS-CoV-2 by FDA under an Emergency Use Authorization (EUA).  This EUA will  remain in effect (meaning this test ca n be used) for the duration of  the COVID-19 declaration under Section 564(b)(1) of the Act, 21 U.S.C. section 360bbb-3(b)(1), unless the authorization is terminated or revoked sooner.     Influenza A by PCR NEGATIVE NEGATIVE Final   Influenza B by PCR NEGATIVE NEGATIVE Final    Comment: (NOTE) The Xpert Xpress SARS-CoV-2/FLU/RSV plus assay is intended as an aid in the diagnosis of influenza from Nasopharyngeal swab specimens and should not be used as a sole basis for treatment. Nasal washings and aspirates are unacceptable for Xpert Xpress SARS-CoV-2/FLU/RSV testing.  Fact Sheet for Patients: EntrepreneurPulse.com.au  Fact Sheet for Healthcare Providers: IncredibleEmployment.be  This test is not yet approved or cleared by the [place] FDA and has been authorized for detection and/or diagnosis of SARS-CoV-2 by FDA under an Emergency Use Authorization (EUA). This EUA will remain in effect (meaning this test can be used) for the duration of the COVID-19 declaration under Section 564(b)(1) of the Act, 21 U.S.C. section 360bbb-3(b)(1), unless the authorization is terminated or revoked.  Performed at [facility], [place] [address], [place], [place] [address]   Blood Culture (routine x 2)     Status: None (Preliminary result)   Collection Time: 11/29/20  7:41 PM   Specimen: BLOOD LEFT HAND  Result Value Ref Range Status   Specimen Description BLOOD LEFT HAND  Final   Special Requests   Final    BOTTLES DRAWN AEROBIC AND ANAEROBIC Blood Culture results may not be  optimal due to an inadequate volume of blood received in culture bottles   Culture   Final    NO GROWTH 3 DAYS Performed at [facility], [place] [address], [place], [place] [address]    Report Status PENDING  Incomplete  Blood Culture (routine x 2)     Status: None (Preliminary result)   Collection Time: 11/29/20  7:41 PM   Specimen: BLOOD  Result Value Ref Range Status   Specimen Description BLOOD LEFT ANTECUBITAL  Final   Special Requests   Final    BOTTLES DRAWN AEROBIC AND ANAEROBIC Blood Culture adequate volume   Culture   Final    NO GROWTH 3 DAYS Performed at [facility], [place] [address], [place],  [address]    Report Status PENDING  Incomplete     Scheduled Meds: . albuterol  8 puff Inhalation QID  . vitamin C  500 mg Oral Daily  . enoxaparin (LOVENOX) injection  60 mg Subcutaneous Daily  . insulin aspart  0-15 Units Subcutaneous TID WC  . insulin aspart  0-5 Units Subcutaneous QHS  . insulin aspart  3 Units Subcutaneous TID WC  . [START ON 12/03/2020] predniSONE  50 mg Oral Daily  . zinc sulfate  220 mg Oral Daily   Continuous Infusions: . remdesivir 100 mg in NS 100 mL 100 mg (12/02/20 0915)     LOS: 3 days   Cherene Altes, MD [facility] Office  [phone] Pager - Text Page per Shea Evans  If 7PM-7AM, please contact night-coverage per Amion 12/02/2020, 9:48 AM

## 2020-12-02 NOTE — Progress Notes (Signed)
SATURATION QUALIFICATIONS: (This note is used to comply with regulatory documentation for home oxygen)  Patient Saturations on Room Air at Rest = 91%  Patient Saturations on Room Air while Ambulating =86 %  Patient Saturations on 2 Liters of oxygen while Ambulating = 91%  Please briefly explain why patient needs home oxygen: patient becomes short of breath easily on exertion and starts to panic

## 2020-12-02 NOTE — Evaluation (Signed)
Physical Therapy Evaluation Patient Details Name: Jasmine Heath MRN: [id number] DOB: 1971-04-24 Today's Date: 12/02/2020   History of Present Illness  50 year old with a history of asthma and ongoing tobacco abuse who presented to the ER with shortness of breath for 4 days accompanied by intermittent chest tightness unresponsive to albuterol use at home.  In the ER she was found to be hypoxic at 84% on room air requiring 4 L nasal cannula support to recover, and wheezing. Found to be COVID positive.  Clinical Impression  PTA pt living with mother and fiance in multistory home with 5 steps to enter and bedroom downstairs. Pt reports independence, working and driving.  Pt is mod I for bed mobility and transfers. Pt requires min guard progressing to min A for safety as pt currently has limited understanding of her O2 demand and increased work of breathing with ambulation. Pt with tendency to rush mobility when she gets out of breath. Educated on need for supplemental O2 to maintain SaO2 > 90%O2 and to take standing rest breaks so that she can keep her ambulation at a safe velocity. PT does not recommend any PT services or equipment at discharge, however will continue to see acutely to progress safe ambulation.     Follow Up Recommendations No PT follow up;Supervision - Intermittent    Equipment Recommendations  None recommended by PT       Precautions / Restrictions Precautions Precautions: None Precaution Comments: watch SpO2 Restrictions Weight Bearing Restrictions: No      Mobility  Bed Mobility Overal bed mobility: Modified Independent                  Transfers Overall transfer level: Modified independent                  Ambulation/Gait Ambulation/Gait assistance: Min guard;Min assist Gait Distance (Feet): 100 Feet Assistive device: None Gait Pattern/deviations: Step-through pattern;Trunk flexed;Shuffle Gait velocity: to fast for conditions as pt becomes  SoB Gait velocity interpretation: 1.31 - 2.62 ft/sec, indicative of limited community ambulator General Gait Details: min guard progressing to contact guard assist with ambulation, pt with poor understanding of decreased endurance especially in presence of oxygen desaturation. pt confident that she does not need supplemental O2 for ambulation however SaO2 drops to min 80s with ambulation and pt experiences 4/4 DoE by end of walk. Educated on need to slow down and take standing rest break instead of trying to push through with increasing speed and decreasing stability      Balance Overall balance assessment: No apparent balance deficits (not formally assessed)                                           Pertinent Vitals/Pain Pain Assessment: No/denies pain    Home Living Family/patient expects to be discharged to:: Private residence Living Arrangements: Spouse/significant other;Parent Available Help at Discharge: Family;Available 24 hours/day Type of Home: House Home Access: Stairs to enter   [employer] of Steps: 5 Home Layout: Two level;Full bath on main level Home Equipment: None (mother has equipment) Additional Comments: helps care for mother (whom has LE amputation)- mother has all accessible shower/etc upstairs    Prior Function Level of Independence: Independent         Comments: Works for [employer]        Extremity/Trunk Assessment   Upper Extremity Assessment Upper Extremity Assessment:  Defer to OT evaluation    Lower Extremity Assessment Lower Extremity Assessment: Overall WFL for tasks assessed       Communication   Communication: No difficulties  Cognition Arousal/Alertness: Awake/alert Behavior During Therapy: WFL for tasks assessed/performed Overall Cognitive Status: Within Functional Limits for tasks assessed                                        General Comments General comments (skin integrity,  edema, etc.): Pt on 2 L O2 via [place] on entry, with SaO2 95%O2        Assessment/Plan    PT Assessment Patient needs continued PT services  PT Problem List Decreased activity tolerance;Cardiopulmonary status limiting activity;Decreased safety awareness       PT Treatment Interventions Gait training;Stair training;Functional mobility training;Therapeutic activities;Therapeutic exercise;Balance training;Cognitive remediation;Patient/family education    PT Goals (Current goals can be found in the Care Plan section)  Acute Rehab PT Goals Patient Stated Goal: return home PT Goal Formulation: With patient Time For Goal Achievement: 12/16/20 Potential to Achieve Goals: Good    Frequency Min 3X/week    AM-PAC PT "6 Clicks" Mobility  Outcome Measure Help needed turning from your back to your side while in a flat bed without using bedrails?: None Help needed moving from lying on your back to sitting on the side of a flat bed without using bedrails?: None Help needed moving to and from a bed to a chair (including a wheelchair)?: None Help needed standing up from a chair using your arms (e.g., wheelchair or bedside chair)?: None Help needed to walk in hospital room?: None Help needed climbing 3-5 steps with a railing? : A Little 6 Click Score: 23    End of Session   Activity Tolerance: Patient limited by fatigue Patient left: in bed;with call bell/phone within reach Nurse Communication: Mobility status;Other (comment) (supplemental O2 need) PT Visit Diagnosis: Other abnormalities of gait and mobility (R26.89);Muscle weakness (generalized) (M62.81);Difficulty in walking, not elsewhere classified (R26.2)    Time: [id number] PT Time Calculation (min) (ACUTE ONLY): 15 min   Charges:   PT Evaluation $PT Eval Moderate Complexity: 1 Mod          Eleanora Guinyard B. Migdalia Dk PT, DPT Acute Rehabilitation Services Pager [phone] Office [phone]   [place] 12/02/2020, 5:17 PM

## 2020-12-02 NOTE — Evaluation (Signed)
Occupational Therapy Evaluation Patient Details Name: Jasmine Heath MRN: [id number] DOB: 09-11-1971 Today's Date: 12/02/2020    History of Present Illness 50 year old with a history of asthma and ongoing tobacco abuse who presented to the ER with shortness of breath for 4 days accompanied by intermittent chest tightness unresponsive to albuterol use at home.  In the ER she was found to be hypoxic at 84% on room air requiring 4 L nasal cannula support to recover, and wheezing. Found to be COVID positive.   Clinical Impression   PTA pt living with family and functioning at independent community level. Pt works full time and helps care for her mother with physical impairments. At time of eval, pt able to complete all mobility and ADL at mod I level. She requires increased time and education to implement energy conservation skills. Educated pt on pursed lip breathing, pacing, taking rest breaks, and how to reintegrate into typical daily routine. Pt able to walk ~150 ft without AD and RA with SpO2 as low as 84%. Pt currently requires 2L [place] to maintain SpO2 >88% with exertion. No post acute needs identified. OT will continue to follow acutely to facilitate safe d/c per POC listed below.    Follow Up Recommendations  No OT follow up    Equipment Recommendations  None recommended by OT    Recommendations for Other Services       Precautions / Restrictions Precautions Precautions: None Precaution Comments: watch SpO2 Restrictions Weight Bearing Restrictions: No      Mobility Bed Mobility Overal bed mobility: Modified Independent                  Transfers Overall transfer level: Modified independent                    Balance Overall balance assessment: No apparent balance deficits (not formally assessed)                                         ADL either performed or assessed with clinical judgement   ADL Overall ADL's : Modified independent                                        General ADL Comments: Pt is able to complete all ADL at mod I level (increased time), but requires education and cues to implement energy conservation techniques     Vision Patient Visual Report: No change from baseline       Perception     Praxis      Pertinent Vitals/Pain Pain Assessment: No/denies pain     Hand Dominance     Extremity/Trunk Assessment Upper Extremity Assessment Upper Extremity Assessment: Overall WFL for tasks assessed   Lower Extremity Assessment Lower Extremity Assessment: Defer to PT evaluation       Communication Communication Communication: No difficulties   Cognition Arousal/Alertness: Awake/alert Behavior During Therapy: WFL for tasks assessed/performed Overall Cognitive Status: Within Functional Limits for tasks assessed                                     General Comments       Exercises     Shoulder Instructions  Home Living Family/patient expects to be discharged to:: Private residence Living Arrangements: Spouse/significant other;Parent Available Help at Discharge: Family;Available 24 hours/day Type of Home: House Home Access: Stairs to enter [employer] of Steps: 5   Home Layout: Two level;Full bath on main level Alternate Level Stairs-Number of Steps: goes down 5 steps to bedroom   Bathroom Shower/Tub: Occupational psychologist: Standard Bathroom Accessibility: Yes How Accessible: Accessible via walker Home Equipment: None (mother has equipment)   Additional Comments: helps care for mother (whom has LE amputation)- mother has all accessible shower/etc upstairs      Prior Functioning/Environment Level of Independence: Independent        Comments: Works for [employer]        OT Problem List: Decreased knowledge of use of DME or AE;Decreased knowledge of precautions;Cardiopulmonary status limiting activity;Decreased  activity tolerance      OT Treatment/Interventions: Self-care/ADL training;Therapeutic exercise;Patient/family education;Balance training;Energy conservation;Therapeutic activities;DME and/or AE instruction    OT Goals(Current goals can be found in the care plan section) Acute Rehab OT Goals Patient Stated Goal: return home OT Goal Formulation: With patient Time For Goal Achievement: 12/16/20 Potential to Achieve Goals: Good  OT Frequency: Min 2X/week   Barriers to D/C:            Co-evaluation              AM-PAC OT "6 Clicks" Daily Activity     Outcome Measure Help from another person eating meals?: None Help from another person taking care of personal grooming?: None Help from another person toileting, which includes using toliet, bedpan, or urinal?: None Help from another person bathing (including washing, rinsing, drying)?: None Help from another person to put on and taking off regular upper body clothing?: None Help from another person to put on and taking off regular lower body clothing?: None 6 Click Score: 24   End of Session Equipment Utilized During Treatment: Oxygen Nurse Communication: Mobility status  Activity Tolerance: Patient tolerated treatment well Patient left: in bed;with call bell/phone within reach  OT Visit Diagnosis: Other abnormalities of gait and mobility (R26.89)                Time: [id number] OT Time Calculation (min): 15 min Charges:  OT General Charges $OT Visit: 1 Visit OT Evaluation $OT Eval Low Complexity: 1 Low  Zenovia Jarred, MSOT, OTR/L Acute Rehabilitation Services [facility] Office Number: [phone] Pager: [phone]  Zenovia Jarred 12/02/2020, 5:02 PM

## 2020-12-03 DIAGNOSIS — U071 COVID-19: Secondary | ICD-10-CM | POA: Diagnosis not present

## 2020-12-03 DIAGNOSIS — J9600 Acute respiratory failure, unspecified whether with hypoxia or hypercapnia: Secondary | ICD-10-CM | POA: Diagnosis not present

## 2020-12-03 LAB — CBC WITH DIFFERENTIAL/PLATELET
Abs Immature Granulocytes: 0.21 10*3/uL — ABNORMAL HIGH (ref 0.00–0.07)
Basophils Absolute: 0 10*3/uL (ref 0.0–0.1)
Basophils Relative: 0 %
Eosinophils Absolute: 0.2 10*3/uL (ref 0.0–0.5)
Eosinophils Relative: 2 %
HCT: 34.7 % — ABNORMAL LOW (ref 36.0–46.0)
Hemoglobin: 11.8 g/dL — ABNORMAL LOW (ref 12.0–15.0)
Immature Granulocytes: 2 %
Lymphocytes Relative: 26 %
Lymphs Abs: 3.1 10*3/uL (ref 0.7–4.0)
MCH: 29.9 pg (ref 26.0–34.0)
MCHC: 34 g/dL (ref 30.0–36.0)
MCV: 87.8 fL (ref 80.0–100.0)
Monocytes Absolute: 0.8 10*3/uL (ref 0.1–1.0)
Monocytes Relative: 6 %
Neutro Abs: 7.6 10*3/uL (ref 1.7–7.7)
Neutrophils Relative %: 64 %
Platelets: 250 10*3/uL (ref 150–400)
RBC: 3.95 MIL/uL (ref 3.87–5.11)
RDW: 13.4 % (ref 11.5–15.5)
WBC: 12 10*3/uL — ABNORMAL HIGH (ref 4.0–10.5)
nRBC: 0 % (ref 0.0–0.2)

## 2020-12-03 LAB — COMPREHENSIVE METABOLIC PANEL
ALT: 27 U/L (ref 0–44)
AST: 14 U/L — ABNORMAL LOW (ref 15–41)
Albumin: 3.1 g/dL — ABNORMAL LOW (ref 3.5–5.0)
Alkaline Phosphatase: 52 U/L (ref 38–126)
Anion gap: 7 (ref 5–15)
BUN: 11 mg/dL (ref 6–20)
CO2: 29 mmol/L (ref 22–32)
Calcium: 8.7 mg/dL — ABNORMAL LOW (ref 8.9–10.3)
Chloride: 99 mmol/L (ref 98–111)
Creatinine, Ser: 0.67 mg/dL (ref 0.44–1.00)
GFR, Estimated: >60 mL/min (ref >60)
Glucose, Bld: 302 mg/dL — ABNORMAL HIGH (ref 70–99)
Potassium: 3.7 mmol/L (ref 3.5–5.1)
Sodium: 135 mmol/L (ref 135–145)
Total Bilirubin: 0.4 mg/dL (ref 0.3–1.2)
Total Protein: 6.6 g/dL (ref 6.5–8.1)

## 2020-12-03 LAB — C-REACTIVE PROTEIN: CRP: 1.5 mg/dL — ABNORMAL HIGH (ref <1.0)

## 2020-12-03 LAB — GLUCOSE, CAPILLARY
Glucose-Capillary: 160 mg/dL — ABNORMAL HIGH (ref 70–99)
Glucose-Capillary: 272 mg/dL — ABNORMAL HIGH (ref 70–99)
Glucose-Capillary: 293 mg/dL — ABNORMAL HIGH (ref 70–99)

## 2020-12-03 LAB — D-DIMER, QUANTITATIVE: D-Dimer, Quant: 0.3 ug/mL-FEU (ref 0.00–0.50)

## 2020-12-03 MED ORDER — ACETAMINOPHEN 325 MG PO TABS: 650.0000 mg | ORAL_TABLET | Freq: Four times a day (QID) | ORAL | Status: DC | PRN

## 2020-12-03 MED ORDER — PREDNISONE 20 MG PO TABS: ORAL_TABLET | ORAL | 0 refills | Status: DC

## 2020-12-03 MED ORDER — GUAIFENESIN-DM 100-10 MG/5ML PO SYRP: 10.0000 mL | ORAL_SOLUTION | ORAL | 0 refills | Status: DC | PRN

## 2020-12-03 NOTE — TOC Transition Note (Addendum)
Transition of Care [facility]) - CM/SW Discharge Note   Patient Details  Name: Narelle Schoening MRN: [id number] Date of Birth: 04/13/1971  [facility] CM/SW Contact:  Lawerance Sabal, RN Phone Number: 12/03/2020, 11:31 AM   Clinical Narrative:    Sherron Monday w patient over the phone. She states that she will ave ride home from sister. Identified that she will need home oxygen. Discussed DME providers and [employer] will deliver portable oxygen concentrator and back up tank to the unit for patient to take home with her.  COVID clinic added to AVS, patient may also call number on insurance card to find a PCP.  No other CM needs identified at this time.     Final next level of care: Home/Self Care Barriers to Discharge: No Barriers Identified   Patient Goals and CMS Choice Patient states their goals for this hospitalization and ongoing recovery are:: to go home      Discharge Placement                       Discharge Plan and Services                DME Arranged: Oxygen DME Agency: [employer] Date DME Agency Contacted: 12/03/20 Time DME Agency Contacted: [phone] Representative spoke with at DME Agency: Arnold [place] and [place]            Social Determinants of Health (SDOH) Interventions     Readmission Risk Interventions No flowsheet data found.

## 2020-12-03 NOTE — Progress Notes (Signed)
Patient discharging home. Vital signs stable at time of discharge as reflected in discharge summary. Discharge instructions given and verbal understanding returned. Patient discharging with O2 tank and oxygen condenser. Patient waiting for family to pick up.

## 2020-12-03 NOTE — Discharge Instructions (Signed)
Date of Positive COVID Test: 12/29  Date Quarantine Ends: 12/13/20    COVID-19 COVID-19 is a respiratory infection that is caused by a virus called severe acute respiratory syndrome coronavirus 2 (SARS-CoV-2). The disease is also known as coronavirus disease or novel coronavirus. In some people, the virus may not cause any symptoms. In others, it may cause a serious infection. The infection can get worse quickly and can lead to complications, such as:  Pneumonia, or infection of the lungs.  Acute respiratory distress syndrome or ARDS. This is a condition in which fluid build-up in the lungs prevents the lungs from filling with air and passing oxygen into the blood.  Acute respiratory failure. This is a condition in which there is not enough oxygen passing from the lungs to the body or when carbon dioxide is not passing from the lungs out of the body.  Sepsis or septic shock. This is a serious bodily reaction to an infection.  Blood clotting problems.  Secondary infections due to bacteria or fungus.  Organ failure. This is when your body's organs stop working. The virus that causes COVID-19 is contagious. This means that it can spread from person to person through droplets from coughs and sneezes (respiratory secretions). What are the causes? This illness is caused by a virus. You may catch the virus by:  Breathing in droplets from an infected person. Droplets can be spread by a person breathing, speaking, singing, coughing, or sneezing.  Touching something, like a table or a doorknob, that was exposed to the virus (contaminated) and then touching your mouth, nose, or eyes. What increases the risk? Risk for infection You are more likely to be infected with this virus if you:  Are within 6 feet (2 meters) of a person with COVID-19.  Provide care for or live with a person who is infected with COVID-19.  Spend time in crowded indoor spaces or live in shared housing. Risk for  serious illness You are more likely to become seriously ill from the virus if you:  Are 48 years of age or older. The higher your age, the more you are at risk for serious illness.  Live in a nursing home or long-term care facility.  Have cancer.  Have a long-term (chronic) disease such as: ? Chronic lung disease, including chronic obstructive pulmonary disease or asthma. ? A long-term disease that lowers your body's ability to fight infection (immunocompromised). ? Heart disease, including heart failure, a condition in which the arteries that lead to the heart become narrow or blocked (coronary artery disease), a disease which makes the heart muscle thick, weak, or stiff (cardiomyopathy). ? Diabetes. ? Chronic kidney disease. ? Sickle cell disease, a condition in which red blood cells have an abnormal "sickle" shape. ? Liver disease.  Are obese. What are the signs or symptoms? Symptoms of this condition can range from mild to severe. Symptoms may appear any time from 2 to 14 days after being exposed to the virus. They include:  A fever or chills.  A cough.  Difficulty breathing.  Headaches, body aches, or muscle aches.  Runny or stuffy (congested) nose.  A sore throat.  New loss of taste or smell. Some people may also have stomach problems, such as nausea, vomiting, or diarrhea. Other people may not have any symptoms of COVID-19. How is this diagnosed? This condition may be diagnosed based on:  Your signs and symptoms, especially if: ? You live in an area with a COVID-19 outbreak. ? You  recently traveled to or from an area where the virus is common. ? You provide care for or live with a person who was diagnosed with COVID-19. ? You were exposed to a person who was diagnosed with COVID-19.  A physical exam.  Lab tests, which may include: ? Taking a sample of fluid from the back of your nose and throat (nasopharyngeal fluid), your nose, or your throat using a  swab. ? A sample of mucus from your lungs (sputum). ? Blood tests.  Imaging tests, which may include, X-rays, CT scan, or ultrasound. How is this treated? At present, there is no medicine to treat COVID-19. Medicines that treat other diseases are being used on a trial basis to see if they are effective against COVID-19. Your health care provider will talk with you about ways to treat your symptoms. For most people, the infection is mild and can be managed at home with rest, fluids, and over-the-counter medicines. Treatment for a serious infection usually takes places in a hospital intensive care unit (ICU). It may include one or more of the following treatments. These treatments are given until your symptoms improve.  Receiving fluids and medicines through an IV.  Supplemental oxygen. Extra oxygen is given through a tube in the nose, a face mask, or a hood.  Positioning you to lie on your stomach (prone position). This makes it easier for oxygen to get into the lungs.  Continuous positive airway pressure (CPAP) or bi-level positive airway pressure (BPAP) machine. This treatment uses mild air pressure to keep the airways open. A tube that is connected to a motor delivers oxygen to the body.  Ventilator. This treatment moves air into and out of the lungs by using a tube that is placed in your windpipe.  Tracheostomy. This is a procedure to create a hole in the neck so that a breathing tube can be inserted.  Extracorporeal membrane oxygenation (ECMO). This procedure gives the lungs a chance to recover by taking over the functions of the heart and lungs. It supplies oxygen to the body and removes carbon dioxide. Follow these instructions at home: Lifestyle  If you are sick, stay home except to get medical care. Your health care provider will tell you how long to stay home. Call your health care provider before you go for medical care.  Rest at home as told by your health care provider.  Do  not use any products that contain nicotine or tobacco, such as cigarettes, e-cigarettes, and chewing tobacco. If you need help quitting, ask your health care provider.  Return to your normal activities as told by your health care provider. Ask your health care provider what activities are safe for you. General instructions  Take over-the-counter and prescription medicines only as told by your health care provider.  Drink enough fluid to keep your urine pale yellow.  Keep all follow-up visits as told by your health care provider. This is important. How is this prevented?  There is no vaccine to help prevent COVID-19 infection. However, there are steps you can take to protect yourself and others from this virus. To protect yourself:   Do not travel to areas where COVID-19 is a risk. The areas where COVID-19 is reported change often. To identify high-risk areas and travel restrictions, check the CDC travel website: FatFares.com.br  If you live in, or must travel to, an area where COVID-19 is a risk, take precautions to avoid infection. ? Stay away from people who are sick. ? Wash  your hands often with soap and water for 20 seconds. If soap and water are not available, use an alcohol-based hand sanitizer. ? Avoid touching your mouth, face, eyes, or nose. ? Avoid going out in public, follow guidance from your state and local health authorities. ? If you must go out in public, wear a cloth face covering or face mask. Make sure your mask covers your nose and mouth. ? Avoid crowded indoor spaces. Stay at least 6 feet (2 meters) away from others. ? Disinfect objects and surfaces that are frequently touched every day. This may include:  Counters and tables.  Doorknobs and light switches.  Sinks and faucets.  Electronics, such as phones, remote controls, keyboards, computers, and tablets. To protect others: If you have symptoms of COVID-19, take steps to prevent the virus from  spreading to others.  If you think you have a COVID-19 infection, contact your health care provider right away. Tell your health care team that you think you may have a COVID-19 infection.  Stay home. Leave your house only to seek medical care. Do not use public transport.  Do not travel while you are sick.  Wash your hands often with soap and water for 20 seconds. If soap and water are not available, use alcohol-based hand sanitizer.  Stay away from other members of your household. Let healthy household members care for children and pets, if possible. If you have to care for children or pets, wash your hands often and wear a mask. If possible, stay in your own room, separate from others. Use a different bathroom.  Make sure that all people in your household wash their hands well and often.  Cough or sneeze into a tissue or your sleeve or elbow. Do not cough or sneeze into your hand or into the air.  Wear a cloth face covering or face mask. Make sure your mask covers your nose and mouth. Where to find more information  Centers for Disease Control and Prevention: PurpleGadgets.be  World Health Organization: https://www.castaneda.info/ Contact a health care provider if:  You live in or have traveled to an area where COVID-19 is a risk and you have symptoms of the infection.  You have had contact with someone who has COVID-19 and you have symptoms of the infection. Get help right away if:  You have trouble breathing.  You have pain or pressure in your chest.  You have confusion.  You have bluish lips and fingernails.  You have difficulty waking from sleep.  You have symptoms that get worse. These symptoms may represent a serious problem that is an emergency. Do not wait to see if the symptoms will go away. Get medical help right away. Call your local emergency services (911 in the [place] Do not drive yourself to the hospital. Let the  emergency medical personnel know if you think you have COVID-19. Summary  COVID-19 is a respiratory infection that is caused by a virus. It is also known as coronavirus disease or novel coronavirus. It can cause serious infections, such as pneumonia, acute respiratory distress syndrome, acute respiratory failure, or sepsis.  The virus that causes COVID-19 is contagious. This means that it can spread from person to person through droplets from breathing, speaking, singing, coughing, or sneezing.  You are more likely to develop a serious illness if you are 23 years of age or older, have a weak immune system, live in a nursing home, or have chronic disease.  There is no medicine to treat COVID-19.  Your health care provider will talk with you about ways to treat your symptoms.  Take steps to protect yourself and others from infection. Wash your hands often and disinfect objects and surfaces that are frequently touched every day. Stay away from people who are sick and wear a mask if you are sick. This information is not intended to replace advice given to you by your health care provider. Make sure you discuss any questions you have with your health care provider. Document Revised: 09/17/2019 Document Reviewed: 12/24/2018 Elsevier Patient Education  2020 Elsevier Inc.   COVID-19: How to Protect Yourself and Others Know how it spreads  There is currently no vaccine to prevent coronavirus disease 2019 (COVID-19).  The best way to prevent illness is to avoid being exposed to this virus.  The virus is thought to spread mainly from person-to-person. ? Between people who are in close contact with one another (within about 6 feet). ? Through respiratory droplets produced when an infected person coughs, sneezes or talks. ? These droplets can land in the mouths or noses of people who are nearby or possibly be inhaled into the lungs. ? COVID-19 may be spread by people who are not showing  symptoms. Everyone should Clean your hands often  Wash your hands often with soap and water for at least 20 seconds especially after you have been in a public place, or after blowing your nose, coughing, or sneezing.  If soap and water are not readily available, use a hand sanitizer that contains at least 60% alcohol. Cover all surfaces of your hands and rub them together until they feel dry.  Avoid touching your eyes, nose, and mouth with unwashed hands. Avoid close contact  Limit contact with others as much as possible.  Avoid close contact with people who are sick.  Put distance between yourself and other people. ? Remember that some people without symptoms may be able to spread virus. ? This is especially important for people who are at higher risk of getting very RetroStamps.it Cover your mouth and nose with a mask when around others  You could spread COVID-19 to others even if you do not feel sick.  Everyone should wear a mask in public settings and when around people not living in their household, especially when social distancing is difficult to maintain. ? Masks should not be placed on young children under age 37, anyone who has trouble breathing, or is unconscious, incapacitated or otherwise unable to remove the mask without assistance.  The mask is meant to protect other people in case you are infected.  Do NOT use a facemask meant for a Research scientist (physical sciences).  Continue to keep about 6 feet between yourself and others. The mask is not a substitute for social distancing. Cover coughs and sneezes  Always cover your mouth and nose with a tissue when you cough or sneeze or use the inside of your elbow.  Throw used tissues in the trash.  Immediately wash your hands with soap and water for at least 20 seconds. If soap and water are not readily available, clean your hands with a hand sanitizer that contains  at least 60% alcohol. Clean and disinfect  Clean AND disinfect frequently touched surfaces daily. This includes tables, doorknobs, light switches, countertops, handles, desks, phones, keyboards, toilets, faucets, and sinks. ktimeonline.com  If surfaces are dirty, clean them: Use detergent or soap and water prior to disinfection.  Then, use a household disinfectant. You can see a list of EPA-registered household  disinfectants here. michellinders.com 08/04/2019 This information is not intended to replace advice given to you by your health care provider. Make sure you discuss any questions you have with your health care provider. Document Revised: 08/12/2019 Document Reviewed: 06/10/2019 Elsevier Patient Education  [place].    COVID-19 Frequently Asked Questions COVID-19 (coronavirus disease) is an infection that is caused by a large family of viruses. Some viruses cause illness in people and others cause illness in animals like camels, cats, and bats. In some cases, the viruses that cause illness in animals can spread to humans. Where did the coronavirus come from? In December 2019, [place] told the [facility]) of several cases of lung disease (human respiratory illness). These cases were linked to an open seafood and livestock market in the city of [place]. The link to the seafood and livestock market suggests that the virus may have spread from animals to humans. However, since that first outbreak in December, the virus has also been shown to spread from person to person. What is the name of the disease and the virus? Disease name Early on, this disease was called novel coronavirus. This is because scientists determined that the disease was caused by a new (novel) respiratory virus. The World Health Organization [facility]) has now named the disease COVID-19, or coronavirus disease. Virus name The virus  that causes the disease is called severe acute respiratory syndrome coronavirus 2 (SARS-CoV-2). More information on disease and virus naming World Health Organization [facility]): www.who.int/emergencies/diseases/novel-coronavirus-2019/technical-guidance/naming-the-coronavirus-disease-(covid-2019)-and-the-virus-that-causes-it Who is at risk for complications from coronavirus disease? Some people may be at higher risk for complications from coronavirus disease. This includes older adults and people who have chronic diseases, such as heart disease, diabetes, and lung disease. If you are at higher risk for complications, take these extra precautions:  Stay home as much as possible.  Avoid social gatherings and travel.  Avoid close contact with others. Stay at least 6 ft (2 m) away from others, if possible.  Wash your hands often with soap and water for at least 20 seconds.  Avoid touching your face, mouth, nose, or eyes.  Keep supplies on hand at home, such as food, medicine, and cleaning supplies.  If you must go out in public, wear a cloth face covering or face mask. Make sure your mask covers your nose and mouth. How does coronavirus disease spread? The virus that causes coronavirus disease spreads easily from person to person (is contagious). You may catch the virus by:  Breathing in droplets from an infected person. Droplets can be spread by a person breathing, speaking, singing, coughing, or sneezing.  Touching something, like a table or a doorknob, that was exposed to the virus (contaminated) and then touching your mouth, nose, or eyes. Can I get the virus from touching surfaces or objects? There is still a lot that we do not know about the virus that causes coronavirus disease. Scientists are basing a lot of information on what they know about similar viruses, such as:  Viruses cannot generally survive on surfaces for long. They need a human body (host) to survive.  It is more likely  that the virus is spread by close contact with people who are sick (direct contact), such as through: ? Shaking hands or hugging. ? Breathing in respiratory droplets that travel through the air. Droplets can be spread by a person breathing, speaking, singing, coughing, or sneezing.  It is less likely that the virus is spread when a person touches a surface  or object that has the virus on it (indirect contact). The virus may be able to enter the body if the person touches a surface or object and then touches his or her face, eyes, nose, or mouth. Can a person spread the virus without having symptoms of the disease? It may be possible for the virus to spread before a person has symptoms of the disease, but this is most likely not the main way the virus is spreading. It is more likely for the virus to spread by being in close contact with people who are sick and breathing in the respiratory droplets spread by a person breathing, speaking, singing, coughing, or sneezing. What are the symptoms of coronavirus disease? Symptoms vary from person to person and can range from mild to severe. Symptoms may include:  Fever or chills.  Cough.  Difficulty breathing or feeling short of breath.  Headaches, body aches, or muscle aches.  Runny or stuffy (congested) nose.  Sore throat.  New loss of taste or smell.  Nausea, vomiting, or diarrhea. These symptoms can appear anywhere from 2 to 14 days after you have been exposed to the virus. Some people may not have any symptoms. If you develop symptoms, call your health care provider. People with severe symptoms may need hospital care. Should I be tested for this virus? Your health care provider will decide whether to test you based on your symptoms, history of exposure, and your risk factors. How does a health care provider test for this virus? Health care providers will collect samples to send for testing. Samples may include:  Taking a swab of fluid from  the back of your nose and throat, your nose, or your throat.  Taking fluid from the lungs by having you cough up mucus (sputum) into a sterile cup.  Taking a blood sample. Is there a treatment or vaccine for this virus? Currently, there is no vaccine to prevent coronavirus disease. Also, there are no medicines like antibiotics or antivirals to treat the virus. A person who becomes sick is given supportive care, which means rest and fluids. A person may also relieve his or her symptoms by using over-the-counter medicines that treat sneezing, coughing, and runny nose. These are the same medicines that a person takes for the common cold. If you develop symptoms, call your health care provider. People with severe symptoms may need hospital care. What can I do to protect myself and my family from this virus?     You can protect yourself and your family by taking the same actions that you would take to prevent the spread of other viruses. Take the following actions:  Wash your hands often with soap and water for at least 20 seconds. If soap and water are not available, use alcohol-based hand sanitizer.  Avoid touching your face, mouth, nose, or eyes.  Cough or sneeze into a tissue, sleeve, or elbow. Do not cough or sneeze into your hand or the air. ? If you cough or sneeze into a tissue, throw it away immediately and wash your hands.  Disinfect objects and surfaces that you frequently touch every day.  Stay away from people who are sick.  Avoid going out in public, follow guidance from your state and local health authorities.  Avoid crowded indoor spaces. Stay at least 6 ft (2 m) away from others.  If you must go out in public, wear a cloth face covering or face mask. Make sure your mask covers your nose and mouth.  Stay home if you are sick, except to get medical care. Call your health care provider before you get medical care. Your health care provider will tell you how long to stay  home.  Make sure your vaccines are up to date. Ask your health care provider what vaccines you need. What should I do if I need to travel? Follow travel recommendations from your local health authority, the CDC, and WHO. Travel information and advice  Centers for Disease Control and Prevention (CDC): GeminiCard.gl  World Health Organization [facility]): PreviewDomains.se Know the risks and take action to protect your health  You are at higher risk of getting coronavirus disease if you are traveling to areas with an outbreak or if you are exposed to travelers from areas with an outbreak.  Wash your hands often and practice good hygiene to lower the risk of catching or spreading the virus. What should I do if I am sick? General instructions to stop the spread of infection  Wash your hands often with soap and water for at least 20 seconds. If soap and water are not available, use alcohol-based hand sanitizer.  Cough or sneeze into a tissue, sleeve, or elbow. Do not cough or sneeze into your hand or the air.  If you cough or sneeze into a tissue, throw it away immediately and wash your hands.  Stay home unless you must get medical care. Call your health care provider or local health authority before you get medical care.  Avoid public areas. Do not take public transportation, if possible.  If you can, wear a mask if you must go out of the house or if you are in close contact with someone who is not sick. Make sure your mask covers your nose and mouth. Keep your home clean  Disinfect objects and surfaces that are frequently touched every day. This may include: ? Counters and tables. ? Doorknobs and light switches. ? Sinks and faucets. ? Electronics such as phones, remote controls, keyboards, computers, and tablets.  Wash dishes in hot, soapy water or use a dishwasher. Air-dry your dishes.  Wash  laundry in hot water. Prevent infecting other household members  Let healthy household members care for children and pets, if possible. If you have to care for children or pets, wash your hands often and wear a mask.  Sleep in a different bedroom or bed, if possible.  Do not share personal items, such as razors, toothbrushes, deodorant, combs, brushes, towels, and washcloths. Where to find more information Centers for Disease Control and Prevention (CDC)  Information and news updates: CardRetirement.cz World Health Organization [facility])  Information and news updates: AffordableSalon.es  Coronavirus health topic: https://thompson-craig.com/  Questions and answers on COVID-19: kruiseway.com  Global tracker: who.sprinklr.com American Academy of Pediatrics (AAP)  Information for families: www.healthychildren.org/English/health-issues/conditions/chest-lungs/Pages/2019-Novel-Coronavirus.aspx The coronavirus situation is changing rapidly. Check your local health authority website or the CDC and [facility] websites for updates and news. When should I contact a health care provider?  Contact your health care provider if you have symptoms of an infection, such as fever or cough, and you: ? Have been near anyone who is known to have coronavirus disease. ? Have come into contact with a person who is suspected to have coronavirus disease. ? Have traveled to an area where there is an outbreak of COVID-19. When should I get emergency medical care?  Get help right away by calling your local emergency services (911 in the [place] if you have: ? Trouble breathing. ? Pain or  pressure in your chest. ? Confusion. ? Blue-tinged lips and fingernails. ? Difficulty waking from sleep. ? Symptoms that get worse. Let the emergency medical personnel know if you think you have coronavirus disease. Summary  A new  respiratory virus is spreading from person to person and causing COVID-19 (coronavirus disease).  The virus that causes COVID-19 appears to spread easily. It spreads from one person to another through droplets from breathing, speaking, singing, coughing, or sneezing.  Older adults and those with chronic diseases are at higher risk of disease. If you are at higher risk for complications, take extra precautions.  There is currently no vaccine to prevent coronavirus disease. There are no medicines, such as antibiotics or antivirals, to treat the virus.  You can protect yourself and your family by washing your hands often, avoiding touching your face, and covering your coughs and sneezes. This information is not intended to replace advice given to you by your health care provider. Make sure you discuss any questions you have with your health care provider. Document Revised: 09/17/2019 Document Reviewed: 03/16/2019 Elsevier Patient Education  [place].

## 2020-12-03 NOTE — Progress Notes (Signed)
SATURATION QUALIFICATIONS: (This note is used to comply with regulatory documentation for home oxygen)  Patient Saturations on Room Air at Rest = 88%  Patient Saturations on Room Air while Ambulating = 86%  Patient Saturations on 2 Liters of oxygen while Ambulating = 92%  Please briefly explain why patient needs home oxygen: Patient needs O2 when walking or her O2 drops

## 2020-12-03 NOTE — Discharge Summary (Signed)
DISCHARGE SUMMARY  Jasmine Heath  MR#: [id number]  DOB:[birth year]  Date of Admission: 11/29/2020 Date of Discharge: 12/03/2020  Attending Physician:Kyrra Prada Hennie Duos, MD  Patient's BP:[id number], No Pcp Per  Consults: none   Disposition: D/C home  Date of Positive COVID Test: 12/29  Date Quarantine Ends: 12/13/20  COVID-19 specific Treatment: Solu-Medrol 12/29 > 1/8 Remdesivir 12/29 > 1/2  Follow-up Appts:  Follow-up Information    Your Primary Care Provider Follow up.   Why: See your primary care provider in 7-10 days for a recheck. If you do not have a primary care provider the Case Manager will assist you in obtaining an appointment at a [facility].               Tests Needing Follow-up: -Determine if home oxygen support with exertion can be discontinued /check ambulatory pulse ox  Discharge Diagnoses: COVID Pneumonia Acute hypoxic respiratory failure Asthma with acute exacerbation Prediabetes Morbid obesity - Body mass index is 43.48 kg/m.  Initial presentation: 50 year old with a history of asthma and ongoing tobacco abuse who presented to the ER with shortness of breath for 4 days accompanied by intermittent chest tightness unresponsive to albuterol use at home.  In the ER she was found to be hypoxic at 84% on room air requiring 4 L nasal cannula support to recover, and wheezing.  CXR was without focal infiltrate.  She was found to be Covid positive.  IV Solu-Medrol and Remdesivir were initiated.  Hospital Course:  COVID Pneumonia - acute hypoxic respiratory failure Treated with Solu-Medrol plus Remdesivir -completed a 5-day course of IV Remdesivir -to complete 5 additional days of tapering dose steroid at home -did not require use of other immunomodulators -saturations dipped into the mid 80s on room air with exertion necessitating home oxygen at 2 L to be used with exertion at time of discharge -outpatient follow-up will likely lead to the  ability to discontinue oxygen support and short course  Asthma with acute exacerbation Treated with inhaler therapy as well as systemic steroid and rapidly improved -no active wheezing at time of discharge  History of prediabetes A1c 6.1 this admit -adjust treatment with climbing CBGs in the setting of steroid therapy  Morbid obesity - Body mass index is 43.48 kg/m.  Allergies as of 12/03/2020   No Known Allergies     Medication List    TAKE these medications   acetaminophen 325 MG tablet Commonly known as: TYLENOL Take 2 tablets (650 mg total) by mouth every 6 (six) hours as needed for mild pain (or Fever >/= 101).   albuterol 108 (90 Base) MCG/ACT inhaler Commonly known as: VENTOLIN HFA Inhale 1-2 puffs into the lungs every 6 (six) hours as needed for wheezing or shortness of breath.   albuterol (2.5 MG/3ML) 0.083% nebulizer solution Commonly known as: PROVENTIL Take 3 mLs (2.5 mg total) by nebulization every 6 (six) hours as needed for wheezing or shortness of breath.   guaiFENesin-dextromethorphan 100-10 MG/5ML syrup Commonly known as: ROBITUSSIN DM Take 10 mLs by mouth every 4 (four) hours as needed for cough.   predniSONE 20 MG tablet Commonly known as: DELTASONE 2 tablets a day on 1/3 and 1/4, 1 tablet a day on 1/5 and 1/6, half a tablet on 1/7 and 1/8, then stop            Durable Medical Equipment  (From admission, onward)         Start     Ordered   12/02/20 1710  For home use only DME oxygen  Once       Question Answer Comment  Length of Need 6 Months   Mode or (Route) Nasal cannula   Liters per Minute 2   Frequency Continuous (stationary and portable oxygen unit needed)   Oxygen conserving device No   Oxygen delivery system Gas      12/02/20 1709          Day of Discharge BP 115/64 (BP Location: Left Arm)   Pulse 82   Temp 98.3 F (36.8 C) (Oral)   Resp 20   Ht 5\' 6"  (1.676 m)   Wt 122.2 kg   LMP 11/05/2020   SpO2 94%   BMI 43.48  kg/m   Physical Exam: General: No acute respiratory distress Lungs: Clear to auscultation bilaterally without wheezes or crackles Cardiovascular: Regular rate and rhythm without murmur gallop or rub normal S1 and S2 Abdomen: Nontender, nondistended, soft, bowel sounds positive, no rebound, no ascites, no appreciable mass Extremities: No significant cyanosis, clubbing, or edema bilateral lower extremities  Basic Metabolic Panel: Recent Labs  Lab 11/29/20 1941 11/29/20 2346 11/30/20 0628 12/01/20 0147 12/02/20 0523 12/03/20 0138  NA 137  --  137 137 133* 135  K 3.7  --  4.0 4.0 4.4 3.7  CL 102  --  102 101 98 99  CO2 25  --  24 26 26 29   GLUCOSE 148*  --  364* 304* 297* 302*  BUN 10  --  10 10 10 11   CREATININE 0.82 0.88 0.90 0.80 0.68 0.67  CALCIUM 8.8*  --  8.8* 8.7* 8.6* 8.7*    Liver Function Tests: Recent Labs  Lab 11/29/20 1941 11/30/20 0628 12/01/20 0147 12/02/20 0523 12/03/20 0138  AST 38 38 25 18 14*  ALT 31 33 32 32 27  ALKPHOS 52 50 49 52 52  BILITOT 0.5 <0.1* 0.3 <0.1* 0.4  PROT 7.1 6.6 6.8 6.8 6.6  ALBUMIN 3.7 3.2* 3.2* 3.2* 3.1*    CBC: Recent Labs  Lab 11/29/20 1015 11/30/20 0628 12/01/20 0147 12/02/20 0523 12/03/20 0138  WBC 7.7 5.2 8.2 10.1 12.0*  NEUTROABS  --  4.5 6.8 8.3* 7.6  HGB 12.0 11.1* 10.9* 11.7* 11.8*  HCT 37.4 35.5* 34.3* 36.3 34.7*  MCV 91.7 93.4 91.0 90.1 87.8  PLT 247 216 208 231 250    CBG: Recent Labs  Lab 12/01/20 2018 12/02/20 1153 12/02/20 1707 12/02/20 2111 12/03/20 0725  GLUCAP 276* 271* 284* 235* 160*    Recent Results (from the past 240 hour(s))  Resp Panel by RT-PCR (Flu A&B, Covid) Nasopharyngeal Swab     Status: Abnormal   Collection Time: 11/29/20  3:01 PM   Specimen: Nasopharyngeal Swab; Nasopharyngeal(NP) swabs in vial transport medium  Result Value Ref Range Status   SARS Coronavirus 2 by RT PCR POSITIVE (A) NEGATIVE Final    Comment: RESULT CALLED TO, READ BACK BY AND VERIFIED  WITH: [place]ro[place], T RN 11/29/20 at 1743 sk  (NOTE) SARS-CoV-2 target nucleic acids are DETECTED.  The SARS-CoV-2 RNA is generally detectable in upper respiratory specimens during the acute phase of infection. Positive results are indicative of the presence of the identified virus, but do not rule out bacterial infection or co-infection with other pathogens not detected by the test. Clinical correlation with patient history and other diagnostic information is necessary to determine patient infection status. The expected result is Negative.  Fact Sheet for Patients: EntrepreneurPulse.com.au  Fact Sheet for Healthcare Providers: IncredibleEmployment.be  This test is not yet approved or cleared by the [place] and  has been authorized for detection and/or diagnosis of SARS-CoV-2 by FDA under an Emergency Use Authorization (EUA).  This EUA will remain in effect (meaning this test ca n be used) for the duration of  the COVID-19 declaration under Section 564(b)(1) of the Act, 21 U.S.C. section 360bbb-3(b)(1), unless the authorization is terminated or revoked sooner.     Influenza A by PCR NEGATIVE NEGATIVE Final   Influenza B by PCR NEGATIVE NEGATIVE Final    Comment: (NOTE) The Xpert Xpress SARS-CoV-2/FLU/RSV plus assay is intended as an aid in the diagnosis of influenza from Nasopharyngeal swab specimens and should not be used as a sole basis for treatment. Nasal washings and aspirates are unacceptable for Xpert Xpress SARS-CoV-2/FLU/RSV testing.  Fact Sheet for Patients: BloggerCourse.com  Fact Sheet for Healthcare Providers: SeriousBroker.it  This test is not yet approved or cleared by the [place] FDA and has been authorized for detection and/or diagnosis of SARS-CoV-2 by FDA under an Emergency Use Authorization (EUA). This EUA will remain in effect (meaning this test can be  used) for the duration of the COVID-19 declaration under Section 564(b)(1) of the Act, 21 U.S.C. section 360bbb-3(b)(1), unless the authorization is terminated or revoked.  Performed at [facility], [address] [place], [place] [address]   Blood Culture (routine x 2)     Status: None (Preliminary result)   Collection Time: 11/29/20  7:41 PM   Specimen: BLOOD LEFT HAND  Result Value Ref Range Status   Specimen Description BLOOD LEFT HAND  Final   Special Requests   Final    BOTTLES DRAWN AEROBIC AND ANAEROBIC Blood Culture results may not be optimal due to an inadequate volume of blood received in culture bottles   Culture   Final    NO GROWTH 4 DAYS Performed at [facility], [address], [place], [place] [address]    Report Status PENDING  Incomplete  Blood Culture (routine x 2)     Status: None (Preliminary result)   Collection Time: 11/29/20  7:41 PM   Specimen: BLOOD  Result Value Ref Range Status   Specimen Description BLOOD LEFT ANTECUBITAL  Final   Special Requests   Final    BOTTLES DRAWN AEROBIC AND ANAEROBIC Blood Culture adequate volume   Culture   Final    NO GROWTH 4 DAYS Performed at [facility], [address] [place], [place] [address]    Report Status PENDING  Incomplete      Time spent in discharge (includes decision making & examination of pt): 35 minutes  12/03/2020, 10:22 AM   Lonia Blood, MD [facility] Office  [phone]

## 2020-12-03 NOTE — Plan of Care (Signed)
  Problem: Education: Goal: Knowledge of risk factors and measures for prevention of condition will improve Outcome: Progressing   Problem: Coping: Goal: Psychosocial and spiritual needs will be supported Outcome: Progressing   Problem: Respiratory: Goal: Will maintain a patent airway Outcome: Progressing Goal: Complications related to the disease process, condition or treatment will be avoided or minimized Outcome: Progressing   

## 2020-12-04 LAB — CULTURE, BLOOD (ROUTINE X 2)
Culture: NO GROWTH
Culture: NO GROWTH
Special Requests: ADEQUATE

## 2020-12-04 LAB — GLUCOSE, CAPILLARY: Glucose-Capillary: 271 mg/dL — ABNORMAL HIGH (ref 70–99)

## 2021-02-02 ENCOUNTER — Other Ambulatory Visit: Payer: BC Managed Care – PPO

## 2021-04-10 ENCOUNTER — Emergency Department (HOSPITAL_COMMUNITY): Payer: Commercial Managed Care - PPO

## 2021-04-10 ENCOUNTER — Emergency Department (HOSPITAL_COMMUNITY)
Admission: EM | Admit: 2021-04-10 | Discharge: 2021-04-10 | Disposition: A | Discharge status: Home / Self Care | Payer: Commercial Managed Care - PPO | Attending: Emergency Medicine | Admitting: Emergency Medicine

## 2021-04-10 ENCOUNTER — Encounter (HOSPITAL_COMMUNITY): Payer: Self-pay

## 2021-04-10 ENCOUNTER — Other Ambulatory Visit: Payer: Self-pay

## 2021-04-10 DIAGNOSIS — Z20822 Contact with and (suspected) exposure to covid-19: Secondary | ICD-10-CM | POA: Insufficient documentation

## 2021-04-10 DIAGNOSIS — J45909 Unspecified asthma, uncomplicated: Secondary | ICD-10-CM | POA: Diagnosis not present

## 2021-04-10 DIAGNOSIS — Z8616 Personal history of COVID-19: Secondary | ICD-10-CM | POA: Diagnosis not present

## 2021-04-10 DIAGNOSIS — F17210 Nicotine dependence, cigarettes, uncomplicated: Secondary | ICD-10-CM | POA: Insufficient documentation

## 2021-04-10 DIAGNOSIS — R000 Tachycardia, unspecified: Secondary | ICD-10-CM | POA: Diagnosis not present

## 2021-04-10 DIAGNOSIS — J1089 Influenza due to other identified influenza virus with other manifestations: Secondary | ICD-10-CM | POA: Diagnosis not present

## 2021-04-10 DIAGNOSIS — E119 Type 2 diabetes mellitus without complications: Secondary | ICD-10-CM | POA: Diagnosis not present

## 2021-04-10 DIAGNOSIS — R0602 Shortness of breath: Secondary | ICD-10-CM | POA: Diagnosis present

## 2021-04-10 DIAGNOSIS — J111 Influenza due to unidentified influenza virus with other respiratory manifestations: Secondary | ICD-10-CM

## 2021-04-10 LAB — HEPATIC FUNCTION PANEL
ALT: 26 U/L (ref 0–44)
AST: 34 U/L (ref 15–41)
Albumin: 3.4 g/dL — ABNORMAL LOW (ref 3.5–5.0)
Alkaline Phosphatase: 54 U/L (ref 38–126)
Bilirubin, Direct: <0.1 mg/dL (ref 0.0–0.2)
Total Bilirubin: 0.3 mg/dL (ref 0.3–1.2)
Total Protein: 7.2 g/dL (ref 6.5–8.1)

## 2021-04-10 LAB — CBC WITH DIFFERENTIAL/PLATELET
Abs Immature Granulocytes: 0.02 10*3/uL (ref 0.00–0.07)
Basophils Absolute: 0 10*3/uL (ref 0.0–0.1)
Basophils Relative: 0 %
Eosinophils Absolute: 0 10*3/uL (ref 0.0–0.5)
Eosinophils Relative: 0 %
HCT: 37.1 % (ref 36.0–46.0)
Hemoglobin: 11.9 g/dL — ABNORMAL LOW (ref 12.0–15.0)
Immature Granulocytes: 0 %
Lymphocytes Relative: 16 %
Lymphs Abs: 0.8 10*3/uL (ref 0.7–4.0)
MCH: 29.1 pg (ref 26.0–34.0)
MCHC: 32.1 g/dL (ref 30.0–36.0)
MCV: 90.7 fL (ref 80.0–100.0)
Monocytes Absolute: 0.6 10*3/uL (ref 0.1–1.0)
Monocytes Relative: 12 %
Neutro Abs: 3.6 10*3/uL (ref 1.7–7.7)
Neutrophils Relative %: 72 %
Platelets: 228 10*3/uL (ref 150–400)
RBC: 4.09 MIL/uL (ref 3.87–5.11)
RDW: 13.9 % (ref 11.5–15.5)
WBC: 5.1 10*3/uL (ref 4.0–10.5)
nRBC: 0 % (ref 0.0–0.2)

## 2021-04-10 LAB — BASIC METABOLIC PANEL
Anion gap: 9 (ref 5–15)
BUN: 10 mg/dL (ref 6–20)
CO2: 20 mmol/L — ABNORMAL LOW (ref 22–32)
Calcium: 8.5 mg/dL — ABNORMAL LOW (ref 8.9–10.3)
Chloride: 103 mmol/L (ref 98–111)
Creatinine, Ser: 0.92 mg/dL (ref 0.44–1.00)
GFR, Estimated: >60 mL/min (ref >60)
Glucose, Bld: 96 mg/dL (ref 70–99)
Potassium: 3.9 mmol/L (ref 3.5–5.1)
Sodium: 132 mmol/L — ABNORMAL LOW (ref 135–145)

## 2021-04-10 LAB — URINALYSIS, ROUTINE W REFLEX MICROSCOPIC
Bacteria, UA: NONE SEEN
Bilirubin Urine: NEGATIVE
Glucose, UA: NEGATIVE mg/dL
Ketones, ur: 20 mg/dL — AB
Leukocytes,Ua: NEGATIVE
Nitrite: NEGATIVE
Protein, ur: NEGATIVE mg/dL
Specific Gravity, Urine: >1.046 — ABNORMAL HIGH (ref 1.005–1.030)
pH: 6 (ref 5.0–8.0)

## 2021-04-10 LAB — LACTIC ACID, PLASMA
Lactic Acid, Venous: 1 mmol/L (ref 0.5–1.9)
Lactic Acid, Venous: 0.8 mmol/L (ref 0.5–1.9)

## 2021-04-10 LAB — POC SARS CORONAVIRUS 2 AG -  ED: SARSCOV2ONAVIRUS 2 AG: NEGATIVE

## 2021-04-10 LAB — D-DIMER, QUANTITATIVE: D-Dimer, Quant: 0.54 ug/mL-FEU — ABNORMAL HIGH (ref 0.00–0.50)

## 2021-04-10 LAB — RESP PANEL BY RT-PCR (FLU A&B, COVID) ARPGX2
Influenza A by PCR: POSITIVE — AB
Influenza B by PCR: NEGATIVE
SARS Coronavirus 2 by RT PCR: NEGATIVE

## 2021-04-10 MED ORDER — IPRATROPIUM-ALBUTEROL 0.5-2.5 (3) MG/3ML IN SOLN
3.0000 mL | Freq: Once | RESPIRATORY_TRACT | Status: AC
  Administered 2021-04-10: 3 mL via RESPIRATORY_TRACT

## 2021-04-10 MED ORDER — IOHEXOL 350 MG/ML SOLN
50.0000 mL | Freq: Once | INTRAVENOUS | Status: AC | PRN
  Administered 2021-04-10: 50 mL via INTRAVENOUS

## 2021-04-10 MED ORDER — ALBUTEROL SULFATE HFA 108 (90 BASE) MCG/ACT IN AERS: 1.0000 | INHALATION_SPRAY | Freq: Four times a day (QID) | RESPIRATORY_TRACT | 2 refills | Status: AC | PRN

## 2021-04-10 MED ORDER — LACTATED RINGERS IV BOLUS
1000.0000 mL | Freq: Once | INTRAVENOUS | Status: AC
  Administered 2021-04-10: 1000 mL via INTRAVENOUS

## 2021-04-10 MED ORDER — ACETAMINOPHEN 500 MG PO TABS
1000.0000 mg | ORAL_TABLET | Freq: Once | ORAL | Status: AC
  Administered 2021-04-10: 1000 mg via ORAL
  Filled 2021-04-10: qty 2

## 2021-04-10 MED ORDER — ONDANSETRON 4 MG PO TBDP: 4.0000 mg | ORAL_TABLET | Freq: Three times a day (TID) | ORAL | 0 refills | Status: DC | PRN

## 2021-04-10 MED ORDER — IPRATROPIUM-ALBUTEROL 0.5-2.5 (3) MG/3ML IN SOLN
3.0000 mL | Freq: Once | RESPIRATORY_TRACT | Status: AC
  Administered 2021-04-10: 3 mL via RESPIRATORY_TRACT
  Filled 2021-04-10: qty 6

## 2021-04-10 NOTE — ED Notes (Signed)
Pt O2 sat 93% while ambulating

## 2021-04-10 NOTE — Discharge Instructions (Addendum)
I am prescribing 2 medications.  The first medication are additional albuterol inhalers.  The second medication is Zofran.  This is a strong nausea medication.  This is the same medication that you were given in the emergency department.  You can take this up to 3 times a day for nausea and vomiting.  Only take this if you are experiencing nausea and vomiting that you cannot control.  Please continue to monitor your symptoms closely.  If you develop any new or worsening symptoms, please return to the emergency department immediately for reevaluation.  It was a pleasure to meet you.

## 2021-04-10 NOTE — ED Triage Notes (Signed)
Pt presents with cough, SOB and weakness since yesterday. SBP 80's x2 in triage

## 2021-04-10 NOTE — ED Provider Notes (Signed)
[facility] EMERGENCY DEPARTMENT Provider Note   CSN: [id number] Arrival date & time: 04/10/21  1418     History Chief Complaint  Patient presents with  . Shortness of Breath  . Weakness    Jasmine Heath is a 50 y.o. female.  HPI Patient is a 50 year old female with a history of acute respiratory failure due to COVID-19, morbid obesity, status asthmaticus, who presents the emergency department due to shortness of breath.  Patient states that she felt normal yesterday morning and later in the day began experiencing worsening shortness of breath, wheezing, as well as a productive cough.  No CP. Reports decreased PO intake the past two days due to her symptoms.  Notes a history of asthma and states that her current symptoms feel worse than typical asthma exacerbations.  She states that she had the COVID-vaccine x2 greater than 6 months ago and also notes getting the COVID-19 infection in December 2021.  Denies chest pain, nausea, vomiting, abdominal pain, urinary complaints.  She also complains of watery brown diarrhea that started this morning.  Reports 4 episodes of diarrhea today.  She states that she had oxygen at home from her previous COVID-19 diagnosis.  She states she does not regularly use oxygen but started using it yesterday when she started becoming short of breath once again.  She has mild relief with home O2.  Also using nebulizers intermittently.  States they are also providing mild short-term relief.    Past Medical History:  Diagnosis Date  . Asthma     Patient Active Problem List   Diagnosis Date Noted  . Acute respiratory failure due to COVID-19 ([place]) 11/29/2020  . Diabetes mellitus ([place]) 11/17/2012  . Anemia 11/15/2012  . Pain due to dental caries 11/15/2012  . Status asthmaticus 11/14/2012  . SOB (shortness of breath) 11/14/2012  . Morbid obesity ([place]) 11/14/2012  . Hypokalemia 11/14/2012    Past Surgical History:  Procedure Laterality Date   . FOOT SURGERY     I+D     OB History   No obstetric history on file.     Family History  Problem Relation Age of Onset  . Asthma Mother     Social History   Tobacco Use  . Smoking status: Current Every Day Smoker    Packs/day: 0.25    Types: Cigarettes  . Smokeless tobacco: Never Used  Vaping Use  . Vaping Use: Never used  Substance Use Topics  . Alcohol use: Yes    Comment: Reports she drinks on holidays   . Drug use: No    Home Medications Prior to Admission medications   Medication Sig Start Date End Date Taking? Authorizing Provider  albuterol (VENTOLIN HFA) 108 (90 Base) MCG/ACT inhaler Inhale 1-2 puffs into the lungs every 6 (six) hours as needed for wheezing or shortness of breath. 04/10/21  Yes Rayna Sexton, PA-C  ondansetron (ZOFRAN ODT) 4 MG disintegrating tablet Take 1 tablet (4 mg total) by mouth every 8 (eight) hours as needed for nausea or vomiting. 04/10/21  Yes Rayna Sexton, PA-C  acetaminophen (TYLENOL) 325 MG tablet Take 2 tablets (650 mg total) by mouth every 6 (six) hours as needed for mild pain (or Fever >/= 101). 12/03/20   Cherene Altes, MD  albuterol (PROVENTIL) (2.5 MG/3ML) 0.083% nebulizer solution Take 3 mLs (2.5 mg total) by nebulization every 6 (six) hours as needed for wheezing or shortness of breath. 05/30/20   Jaynee Eagles, PA-C  albuterol (VENTOLIN HFA) 108 (  90 Base) MCG/ACT inhaler Inhale 1-2 puffs into the lungs every 6 (six) hours as needed for wheezing or shortness of breath. 05/30/20   Jaynee Eagles, PA-C  guaiFENesin-dextromethorphan (ROBITUSSIN DM) 100-10 MG/5ML syrup Take 10 mLs by mouth every 4 (four) hours as needed for cough. 12/03/20   Cherene Altes, MD  predniSONE (DELTASONE) 20 MG tablet 2 tablets a day on 1/3 and 1/4, 1 tablet a day on 1/5 and 1/6, half a tablet on 1/7 and 1/8, then stop 12/03/20   Cherene Altes, MD    Allergies    Patient has no known allergies.  Review of Systems   Review of Systems  All  other systems reviewed and are negative. Ten systems reviewed and are negative for acute change, except as noted in the HPI.   Physical Exam Updated Vital Signs BP 109/61   Pulse (!) 103   Temp (!) 100.7 F (38.2 C) (Oral)   Resp 18   SpO2 96%   Physical Exam Vitals and nursing note reviewed.  Constitutional:      General: She is not in acute distress.    Appearance: Normal appearance. She is well-developed. She is obese. She is not ill-appearing, toxic-appearing or diaphoretic.     Interventions: She is not intubated. HENT:     Head: Normocephalic and atraumatic.     Right Ear: External ear normal.     Left Ear: External ear normal.     Nose: Nose normal.     Mouth/Throat:     Mouth: Mucous membranes are moist.     Pharynx: Oropharynx is clear. No oropharyngeal exudate or posterior oropharyngeal erythema.  Eyes:     Extraocular Movements: Extraocular movements intact.  Cardiovascular:     Rate and Rhythm: Regular rhythm. Tachycardia present.     Pulses: Normal pulses.     Heart sounds: Normal heart sounds. No murmur heard. No friction rub. No gallop.   Pulmonary:     Effort: Pulmonary effort is normal. No tachypnea, bradypnea, accessory muscle usage or respiratory distress. She is not intubated.     Breath sounds: Normal breath sounds. No stridor. No decreased breath sounds, wheezing, rhonchi or rales.     Comments: LCTAB.  Abdominal:     General: Abdomen is flat.     Palpations: Abdomen is soft.     Tenderness: There is no abdominal tenderness.  Musculoskeletal:        General: Normal range of motion.     Cervical back: Normal range of motion and neck supple. No tenderness.     Right lower leg: No tenderness. No edema.     Left lower leg: No tenderness. No edema.  Skin:    General: Skin is warm and dry.  Neurological:     General: No focal deficit present.     Mental Status: She is alert and oriented to person, place, and time.  Psychiatric:        Mood and  Affect: Mood normal.        Behavior: Behavior normal.    ED Results / Procedures / Treatments   Labs (all labs ordered are listed, but only abnormal results are displayed) Labs Reviewed  RESP PANEL BY RT-PCR (FLU A&B, COVID) ARPGX2 - Abnormal; Notable for the following components:      Result Value   Influenza A by PCR POSITIVE (*)    All other components within normal limits  CBC WITH DIFFERENTIAL/PLATELET - Abnormal; Notable for the following components:  Hemoglobin 11.9 (*)    All other components within normal limits  HEPATIC FUNCTION PANEL - Abnormal; Notable for the following components:   Albumin 3.4 (*)    All other components within normal limits  URINALYSIS, ROUTINE W REFLEX MICROSCOPIC - Abnormal; Notable for the following components:   Specific Gravity, Urine >1.046 (*)    Hgb urine dipstick MODERATE (*)    Ketones, ur 20 (*)    All other components within normal limits  BASIC METABOLIC PANEL - Abnormal; Notable for the following components:   Sodium 132 (*)    CO2 20 (*)    Calcium 8.5 (*)    All other components within normal limits  D-DIMER, QUANTITATIVE (NOT AT [facility]) - Abnormal; Notable for the following components:   D-Dimer, Quant 0.54 (*)    All other components within normal limits  CULTURE, BLOOD (ROUTINE X 2)  CULTURE, BLOOD (ROUTINE X 2)  LACTIC ACID, PLASMA  LACTIC ACID, PLASMA  POC SARS CORONAVIRUS 2 AG -  ED   EKG EKG Interpretation  Date/Time:  Tuesday Apr 10 2021 14:24:45 EDT Ventricular Rate:  122 PR Interval:  130 QRS Duration: 62 QT Interval:  304 QTC Calculation: 433 R Axis:   84 Text Interpretation: Sinus tachycardia Right atrial enlargement Borderline ECG When compared to prior, faster rate. No STEMI Confirmed by Antony Blackbird [phone]) on 04/10/2021 2:48:18 PM  Radiology CT Angio Chest PE W and/or Wo Contrast  Result Date: 04/10/2021 CLINICAL DATA:  Shortness of breath and weakness EXAM: CT ANGIOGRAPHY CHEST WITH CONTRAST  TECHNIQUE: Multidetector CT imaging of the chest was performed using the standard protocol during bolus administration of intravenous contrast. Multiplanar CT image reconstructions and MIPs were obtained to evaluate the vascular anatomy. CONTRAST:  66mL OMNIPAQUE IOHEXOL 350 MG/ML SOLN COMPARISON:  Chest x-ray from earlier in the same day. FINDINGS: Cardiovascular: Thoracic aorta and its branches are within normal limits. Pulmonary artery shows a normal branching pattern without definitive intraluminal filling defect to suggest pulmonary embolism. No cardiac enlargement is noted. No coronary calcifications are seen. Mediastinum/Nodes: Thoracic inlet is within normal limits. No sizable hilar or mediastinal adenopathy is noted. The esophagus as visualized is within normal limits. Lungs/Pleura: Lungs are well aerated bilaterally. No focal infiltrate or sizable effusion is seen. Upper Abdomen: Visualized upper abdomen shows no acute abnormality. Musculoskeletal: Degenerative changes of the thoracic spine are noted. No acute bony abnormality is seen. Review of the MIP images confirms the above findings. IMPRESSION: No evidence of pulmonary emboli. No acute abnormality seen. Electronically Signed   By: Inez Catalina M.D.   On: 04/10/2021 18:26   DG Chest Port 1 View  Result Date: 04/10/2021 CLINICAL DATA:  Shortness of breath and cough.  Weakness. EXAM: PORTABLE CHEST 1 VIEW COMPARISON:  12/01/2020 FINDINGS: The cardiomediastinal contours are normal. Mild bronchial thickening. Pulmonary vasculature is normal. No consolidation, pleural effusion, or pneumothorax. No acute osseous abnormalities are seen. IMPRESSION: Mild bronchial thickening. Electronically Signed   By: Keith Rake M.D.   On: 04/10/2021 15:49    Procedures Procedures   Medications Ordered in ED Medications  acetaminophen (TYLENOL) tablet 1,000 mg (1,000 mg Oral Given 04/10/21 1602)  lactated ringers bolus 1,000 mL (0 mLs Intravenous Stopped  04/10/21 1923)  ipratropium-albuterol (DUONEB) 0.5-2.5 (3) MG/3ML nebulizer solution 3 mL (3 mLs Nebulization Given 04/10/21 1922)  lactated ringers bolus 1,000 mL (0 mLs Intravenous Stopped 04/10/21 2138)  ipratropium-albuterol (DUONEB) 0.5-2.5 (3) MG/3ML nebulizer solution 3 mL (3 mLs Nebulization Given 04/10/21  1923)  iohexol (OMNIPAQUE) 350 MG/ML injection 50 mL (50 mLs Intravenous Contrast Given 04/10/21 1803)   ED Course  I have reviewed the triage vital signs and the nursing notes.  Pertinent labs & imaging results that were available during my care of the patient were reviewed by me and considered in my medical decision making (see chart for details).  Clinical Course as of 04/10/21 2144  Tue Apr 10, 2021  1733 Patient reassessed.  Heart rate improved to the mid 90s.  No obvious respiratory distress.  D-dimer is elevated mildly at 0.54.  Will obtain a CT scan of the chest.  We will have nursing staff check patient's pulse ox while ambulating.  We will give an additional liter of IV fluids.  We will closely monitor. [LJ]  1756 Influenza A By PCR(!): POSITIVE [LJ]  1833 D-Dimer, Quant(!): 0.54 [LJ]  1834 CT Angio Chest PE W and/or Wo Contrast No acute abnormalities.  No evidence of PE. [LJ]    Clinical Course User Index [LJ] Rayna Sexton, PA-C   MDM Rules/Calculators/A&P                          Pt is a 50 y.o. female who presents to the emergency department with shortness of breath and decreased p.o. intake.  Labs: CBC with a hemoglobin of 11.9. BMP with a sodium of 132, CO2 of 20, calcium of 8.5. Hepatic function panel with an albumin of 3.4. D-dimer of 0.54. Elevated specific gravity with moderate hemoglobin and 20 ketones. Lactic acid of 0.8. Respiratory panel positive for flu A.  Imaging: Chest x-ray shows mild bronchial thickening. CT scan of the chest shows no evidence of PE.  No acute abnormality seen.  I, Rayna Sexton, PA-C, personally reviewed and evaluated  these images and lab results as part of my medical decision-making.  Patient initially presented febrile with a temperature of 100.7 F as well as tachycardic at 121.  She was mildly hyportensive at 83/60.  Patient fluid bolused, given antipyretics, and closely monitored.  Tachycardia has nearly resolved with a heart rate around 103 bpm.  Patient given a dose of Zofran as well for her nausea.   Patient states she is feeling much better.  Ambulated without difficulty.  Oxygen saturations maintained above 93% with ambulation.  Given her low-grade fever and SOB, there was initial concern for possible PE.  Mildly elevated D-dimer at 0.54.  CT of the chest was obtained and was negative.  Patient found to be positive for flu A at this visit.  She is negative for COVID-19.  Upon further discussion patient notes that she has been mildly nauseated and due to this has not had any significant p.o. intake for the last 2 days.  Feel that this is likely the source of her initial hypotension.  Patient now feeling much better.  Feel that she is stable for discharge at this time and she is agreeable.  Will discharge with additional albuterol inhalers as well as a course of Zofran.  Patient was actually discharged with oxygen when she initially came home from hospitalization last year due to COVID-19.  Patient given very strict return precautions.  Urged her to return to the emergency department if she develops any worsening shortness of breath, fevers, nausea/vomiting.  Her questions were answered and she was amicable at the time of discharge.  Note: Portions of this report may have been transcribed using voice recognition software. Every effort was made  to ensure accuracy; however, inadvertent computerized transcription errors may be present.   Final Clinical Impression(s) / ED Diagnoses Final diagnoses:  Flu    Rx / DC Orders ED Discharge Orders         Ordered    albuterol (VENTOLIN HFA) 108 (90 Base)  MCG/ACT inhaler  Every 6 hours PRN        04/10/21 2127    ondansetron (ZOFRAN ODT) 4 MG disintegrating tablet  Every 8 hours PRN        04/10/21 2127           Rayna Sexton, PA-C 04/10/21 2144    Lucrezia Starch, MD 04/10/21 2336

## 2021-04-10 NOTE — ED Provider Notes (Signed)
Emergency Medicine Provider Triage Evaluation Note  Jasmine Heath , a 50 y.o. female  was evaluated in triage.  Pt complains of shortness of breath since yesterday. Fever 100.7 here. Reports cough, chest tightness.  COVID vaccine > 6 months ago. History of asthma, this feels worse.  Partner in ER with similar symptoms  Review of Systems  Positive: Fever cough sob Negative: Vomiting   Physical Exam  There were no vitals taken for this visit. Gen:   Awake Resp:  Normal effort, diminished lung souds MSK:   Moves extremities without difficulty     Medical Decision Making  Medically screening exam initiated at 2:26 PM.  Appropriate orders placed.  Jasmine Heath was informed that the remainder of the evaluation will be completed by another provider, this initial triage assessment does not replace that evaluation, and the importance of remaining in the ED until their evaluation is complete.  Given hypotension, tachycardia, fever taken to room. Patient actually not toxic appearing.   Kinnie Feil, PA-C 04/10/21 1429     Kinnie Feil, PA-C 04/10/21 1438    Lennice Sites, DO 04/10/21 1617

## 2021-04-10 NOTE — ED Notes (Signed)
Patient transported to CT 

## 2021-04-15 LAB — CULTURE, BLOOD (ROUTINE X 2)
Culture: NO GROWTH
Culture: NO GROWTH
Special Requests: ADEQUATE

## 2021-11-17 ENCOUNTER — Other Ambulatory Visit: Payer: Self-pay

## 2021-11-17 ENCOUNTER — Encounter (HOSPITAL_COMMUNITY): Payer: Self-pay

## 2021-11-17 ENCOUNTER — Emergency Department (HOSPITAL_COMMUNITY)
Admission: EM | Admit: 2021-11-17 | Discharge: 2021-11-18 | Disposition: A | Discharge status: Home / Self Care | Payer: Commercial Managed Care - PPO | Attending: Emergency Medicine | Admitting: Emergency Medicine

## 2021-11-17 DIAGNOSIS — K0889 Other specified disorders of teeth and supporting structures: Secondary | ICD-10-CM | POA: Insufficient documentation

## 2021-11-17 DIAGNOSIS — F17210 Nicotine dependence, cigarettes, uncomplicated: Secondary | ICD-10-CM | POA: Insufficient documentation

## 2021-11-17 DIAGNOSIS — E119 Type 2 diabetes mellitus without complications: Secondary | ICD-10-CM | POA: Insufficient documentation

## 2021-11-17 DIAGNOSIS — K047 Periapical abscess without sinus: Secondary | ICD-10-CM

## 2021-11-17 DIAGNOSIS — Z7951 Long term (current) use of inhaled steroids: Secondary | ICD-10-CM | POA: Insufficient documentation

## 2021-11-17 DIAGNOSIS — J45909 Unspecified asthma, uncomplicated: Secondary | ICD-10-CM | POA: Insufficient documentation

## 2021-11-17 NOTE — ED Triage Notes (Signed)
Patient arrives from home with complaint of dental abscess that began x 1 week ago. Denies any fevers, N/V/D. Last dose of tylenol 2 pm.

## 2021-11-18 MED ORDER — NAPROXEN 375 MG PO TABS: 375.0000 mg | ORAL_TABLET | Freq: Two times a day (BID) | ORAL | 0 refills | Status: DC

## 2021-11-18 MED ORDER — HYDROCODONE-ACETAMINOPHEN 5-325 MG PO TABS: 2.0000 | ORAL_TABLET | ORAL | 0 refills | Status: DC | PRN

## 2021-11-18 MED ORDER — AMOXICILLIN 500 MG PO CAPS: 500.0000 mg | ORAL_CAPSULE | Freq: Three times a day (TID) | ORAL | 0 refills | Status: DC

## 2021-11-18 NOTE — Discharge Instructions (Signed)

## 2021-11-18 NOTE — ED Provider Notes (Signed)
[facility] DEPT Provider Note   CSN: [id number] Arrival date & time: 11/17/21  2043     History Chief Complaint  Patient presents with   Oral Swelling    Jasmine Heath is a 50 y.o. female.  The history is provided by the patient. No language interpreter was used.  Dental Pain Location:  Lower Lower teeth location:  23/LL lateral incisor Quality:  Aching, pressure-like and throbbing Severity:  Severe Onset quality:  Gradual Duration:  1 week Timing:  Constant Progression:  Worsening Chronicity:  Recurrent Context: dental caries   Relieved by:  Nothing Worsened by:  Cold food/drink, hot food/drink, touching and pressure Ineffective treatments:  Acetaminophen Associated symptoms: facial swelling and gum swelling   Associated symptoms: no congestion, no difficulty swallowing, no drooling, no facial pain, no fever, no headaches, no neck pain, no neck swelling, no oral bleeding, no oral lesions and no trismus   Risk factors: lack of dental care       Past Medical History:  Diagnosis Date   Asthma     Patient Active Problem List   Diagnosis Date Noted   Acute respiratory failure due to COVID-19 ([place]) 11/29/2020   Diabetes mellitus ([place]) 11/17/2012   Anemia 11/15/2012   Pain due to dental caries 11/15/2012   Status asthmaticus 11/14/2012   SOB (shortness of breath) 11/14/2012   Morbid obesity ([place]) 11/14/2012   Hypokalemia 11/14/2012    Past Surgical History:  Procedure Laterality Date   FOOT SURGERY     I+D     OB History   No obstetric history on file.     Family History  Problem Relation Age of Onset   Asthma Mother     Social History   Tobacco Use   Smoking status: Every Day    Packs/day: 0.25    Types: Cigarettes   Smokeless tobacco: Never  Vaping Use   Vaping Use: Never used  Substance Use Topics   Alcohol use: Yes    Comment: Reports she drinks on holidays    Drug use: No    Home Medications Prior  to Admission medications   Medication Sig Start Date End Date Taking? Authorizing Provider  amoxicillin (AMOXIL) 500 MG capsule Take 1 capsule (500 mg total) by mouth 3 (three) times daily. 11/18/21  Yes Hasten Sweitzer, PA-C  HYDROcodone-acetaminophen (NORCO) 5-325 MG tablet Take 2 tablets by mouth every 4 (four) hours as needed. 11/18/21  Yes Margarita Mail, PA-C  naproxen (NAPROSYN) 375 MG tablet Take 1 tablet (375 mg total) by mouth 2 (two) times daily with a meal. 11/18/21  Yes Cavion Faiola, PA-C  acetaminophen (TYLENOL) 325 MG tablet Take 2 tablets (650 mg total) by mouth every 6 (six) hours as needed for mild pain (or Fever >/= 101). 12/03/20   Cherene Altes, MD  albuterol (PROVENTIL) (2.5 MG/3ML) 0.083% nebulizer solution Take 3 mLs (2.5 mg total) by nebulization every 6 (six) hours as needed for wheezing or shortness of breath. 05/30/20   Jaynee Eagles, PA-C  albuterol (VENTOLIN HFA) 108 (90 Base) MCG/ACT inhaler Inhale 1-2 puffs into the lungs every 6 (six) hours as needed for wheezing or shortness of breath. 05/30/20   Jaynee Eagles, PA-C  albuterol (VENTOLIN HFA) 108 (90 Base) MCG/ACT inhaler Inhale 1-2 puffs into the lungs every 6 (six) hours as needed for wheezing or shortness of breath. 04/10/21   Rayna Sexton, PA-C  guaiFENesin-dextromethorphan (ROBITUSSIN DM) 100-10 MG/5ML syrup Take 10 mLs by mouth every 4 (four)  hours as needed for cough. 12/03/20   Cherene Altes, MD  ondansetron (ZOFRAN ODT) 4 MG disintegrating tablet Take 1 tablet (4 mg total) by mouth every 8 (eight) hours as needed for nausea or vomiting. 04/10/21   Rayna Sexton, PA-C  predniSONE (DELTASONE) 20 MG tablet 2 tablets a day on 1/3 and 1/4, 1 tablet a day on 1/5 and 1/6, half a tablet on 1/7 and 1/8, then stop 12/03/20   Cherene Altes, MD    Allergies    Patient has no known allergies.  Review of Systems   Review of Systems  Constitutional:  Negative for fever.  HENT:  Positive for facial swelling.  Negative for congestion, drooling and mouth sores.   Musculoskeletal:  Negative for neck pain.  Neurological:  Negative for headaches.   Physical Exam Updated Vital Signs BP (!) 130/99 (BP Location: Right Arm)    Pulse 76    Temp 98.6 F (37 C) (Oral)    Resp 16    Ht 5\' 6"  (1.676 m)    Wt 122.5 kg    SpO2 97%    BMI 43.58 kg/m   Physical Exam Vitals and nursing note reviewed.  Constitutional:      General: She is not in acute distress.    Appearance: She is well-developed. She is not diaphoretic.  HENT:     Head: Normocephalic and atraumatic.     Right Ear: External ear normal.     Left Ear: External ear normal.     Nose: Nose normal.     Mouth/Throat:     Mouth: Mucous membranes are moist.     Dentition: Gingival swelling and dental caries present.  Eyes:     General: No scleral icterus.    Conjunctiva/sclera: Conjunctivae normal.  Cardiovascular:     Rate and Rhythm: Normal rate and regular rhythm.     Heart sounds: Normal heart sounds. No murmur heard.   No friction rub. No gallop.  Pulmonary:     Effort: Pulmonary effort is normal. No respiratory distress.     Breath sounds: Normal breath sounds.  Abdominal:     General: Bowel sounds are normal. There is no distension.     Palpations: Abdomen is soft. There is no mass.     Tenderness: There is no abdominal tenderness. There is no guarding.  Musculoskeletal:     Cervical back: Normal range of motion.  Skin:    General: Skin is warm and dry.  Neurological:     Mental Status: She is alert and oriented to person, place, and time.  Psychiatric:        Behavior: Behavior normal.    ED Results / Procedures / Treatments   Labs (all labs ordered are listed, but only abnormal results are displayed) Labs Reviewed - No data to display  EKG None  Radiology No results found.  Procedures Procedures   Medications Ordered in ED Medications - No data to display  ED Course  I have reviewed the triage vital signs and  the nursing notes.  Pertinent labs & imaging results that were available during my care of the patient were reviewed by me and considered in my medical decision making (see chart for details).    MDM Rules/Calculators/A&P                         Patient with toothache.  No gross abscess.  Exam unconcerning for Ludwig's angina or spread of  infection.  Will treat with penicillin and pain medicine.  Urged patient to follow-up with dentist.    Patient waited for 13+ hours. I saw and personally discharged this patient. Final Clinical Impression(s) / ED Diagnoses Final diagnoses:  Dental infection    Rx / DC Orders ED Discharge Orders          Ordered    HYDROcodone-acetaminophen (NORCO) 5-325 MG tablet  Every 4 hours PRN        11/18/21 1007    naproxen (NAPROSYN) 375 MG tablet  2 times daily with meals        11/18/21 1007    amoxicillin (AMOXIL) 500 MG capsule  3 times daily        11/18/21 1007             Margarita Mail, PA-C 11/18/21 1034    Malvin Johns, MD 11/18/21 1211

## 2022-01-14 ENCOUNTER — Other Ambulatory Visit: Payer: Self-pay

## 2022-01-14 ENCOUNTER — Ambulatory Visit
Admission: RE | Admit: 2022-01-14 | Discharge: 2022-01-14 | Disposition: A | Discharge status: Home / Self Care | Payer: Self-pay | Source: Ambulatory Visit | Attending: Physician Assistant | Admitting: Physician Assistant

## 2022-01-14 VITALS — BP 109/74 | HR 97 | Temp 99.0°F | Resp 18

## 2022-01-14 DIAGNOSIS — K047 Periapical abscess without sinus: Secondary | ICD-10-CM

## 2022-01-14 MED ORDER — AMOXICILLIN 500 MG PO CAPS: 500.0000 mg | ORAL_CAPSULE | Freq: Three times a day (TID) | ORAL | 0 refills | Status: DC

## 2022-01-14 MED ORDER — KETOROLAC TROMETHAMINE 30 MG/ML IJ SOLN
30.0000 mg | Freq: Once | INTRAMUSCULAR | Status: AC
  Administered 2022-01-14: 30 mg via INTRAMUSCULAR

## 2022-01-14 NOTE — ED Triage Notes (Signed)
Onset yesterday of dental abscess located on her left lower jaw. Has been taking tylenol w/o relief. Confirms pain with chewing and notes some facial swelling. Pt has an appt with her dentist on Friday. Emesis x2.

## 2022-01-14 NOTE — ED Provider Notes (Signed)
[facility]    CSN: [id number] Arrival date & time: 01/14/22  1245      History   Chief Complaint Chief Complaint  Patient presents with   1p appt   Dental Pain    Left bottom    HPI Jasmine Heath is a 51 y.o. female.   Patient here today for evaluation of dental abscess located to her left lower jaw area.  She states she has had some swelling.  She has tried taking Tylenol without significant relief.  She reports that she doing seems to worsen pain.  She does have appointment with her dentist coming up in the next 4 days.  She has had 2 episodes of vomiting which has concerned her.  She has not had no fever.  The history is provided by the patient.  Dental Pain Associated symptoms: facial swelling   Associated symptoms: no congestion and no fever    Past Medical History:  Diagnosis Date   Asthma     Patient Active Problem List   Diagnosis Date Noted   Acute respiratory failure due to COVID-19 ([place]) 11/29/2020   Diabetes mellitus ([place]) 11/17/2012   Anemia 11/15/2012   Pain due to dental caries 11/15/2012   Status asthmaticus 11/14/2012   SOB (shortness of breath) 11/14/2012   Morbid obesity ([place]) 11/14/2012   Hypokalemia 11/14/2012    Past Surgical History:  Procedure Laterality Date   FOOT SURGERY     I+D    OB History   No obstetric history on file.      Home Medications    Prior to Admission medications   Medication Sig Start Date End Date Taking? Authorizing Provider  amoxicillin (AMOXIL) 500 MG capsule Take 1 capsule (500 mg total) by mouth 3 (three) times daily. 01/14/22  Yes Francene Finders, PA-C  acetaminophen (TYLENOL) 325 MG tablet Take 2 tablets (650 mg total) by mouth every 6 (six) hours as needed for mild pain (or Fever >/= 101). 12/03/20   Cherene Altes, MD  albuterol (PROVENTIL) (2.5 MG/3ML) 0.083% nebulizer solution Take 3 mLs (2.5 mg total) by nebulization every 6 (six) hours as needed for wheezing or shortness of  breath. 05/30/20   Jaynee Eagles, PA-C  albuterol (VENTOLIN HFA) 108 (90 Base) MCG/ACT inhaler Inhale 1-2 puffs into the lungs every 6 (six) hours as needed for wheezing or shortness of breath. 05/30/20   Jaynee Eagles, PA-C  albuterol (VENTOLIN HFA) 108 (90 Base) MCG/ACT inhaler Inhale 1-2 puffs into the lungs every 6 (six) hours as needed for wheezing or shortness of breath. 04/10/21   Rayna Sexton, PA-C  guaiFENesin-dextromethorphan (ROBITUSSIN DM) 100-10 MG/5ML syrup Take 10 mLs by mouth every 4 (four) hours as needed for cough. 12/03/20   Cherene Altes, MD  HYDROcodone-acetaminophen (NORCO) 5-325 MG tablet Take 2 tablets by mouth every 4 (four) hours as needed. 11/18/21   Margarita Mail, PA-C  naproxen (NAPROSYN) 375 MG tablet Take 1 tablet (375 mg total) by mouth 2 (two) times daily with a meal. 11/18/21   Harris, Abigail, PA-C  ondansetron (ZOFRAN ODT) 4 MG disintegrating tablet Take 1 tablet (4 mg total) by mouth every 8 (eight) hours as needed for nausea or vomiting. 04/10/21   Rayna Sexton, PA-C  predniSONE (DELTASONE) 20 MG tablet 2 tablets a day on 1/3 and 1/4, 1 tablet a day on 1/5 and 1/6, half a tablet on 1/7 and 1/8, then stop 12/03/20   Cherene Altes, MD    Family History  Family History  Problem Relation Age of Onset   Asthma Mother     Social History Social History   Tobacco Use   Smoking status: Every Day    Packs/day: 0.25    Types: Cigarettes   Smokeless tobacco: Never  Vaping Use   Vaping Use: Never used  Substance Use Topics   Alcohol use: Yes    Comment: Reports she drinks on holidays    Drug use: No     Allergies   Patient has no known allergies.   Review of Systems Review of Systems  Constitutional:  Negative for chills and fever.  HENT:  Positive for dental problem and facial swelling. Negative for congestion.   Eyes:  Negative for discharge and redness.  Gastrointestinal:  Positive for nausea and vomiting. Negative for abdominal pain.     Physical Exam Triage Vital Signs ED Triage Vitals  Enc Vitals Group     BP 01/14/22 1254 109/74     Pulse Rate 01/14/22 1254 97     Resp 01/14/22 1254 18     Temp 01/14/22 1254 99 F (37.2 C)     Temp Source 01/14/22 1254 Oral     SpO2 01/14/22 1254 94 %     Weight --      Height --      Head Circumference --      Peak Flow --      Pain Score 01/14/22 1258 8     Pain Loc --      Pain Edu? --      Excl. in [place]? --    No data found.  Updated Vital Signs BP 109/74 (BP Location: Left Arm)    Pulse 97    Temp 99 F (37.2 C) (Oral)    Resp 18    SpO2 94%      Physical Exam Vitals and nursing note reviewed.  Constitutional:      General: She is not in acute distress.    Appearance: Normal appearance. She is not ill-appearing.  HENT:     Head: Normocephalic and atraumatic.     Mouth/Throat:     Comments: Poor dentition throughout.  Gingival swelling noted to left lower teeth diffusely.  Swelling noted to left mandibular area Eyes:     Conjunctiva/sclera: Conjunctivae normal.  Cardiovascular:     Rate and Rhythm: Normal rate.  Pulmonary:     Effort: Pulmonary effort is normal.  Neurological:     Mental Status: She is alert.  Psychiatric:        Mood and Affect: Mood normal.        Behavior: Behavior normal.        Thought Content: Thought content normal.     UC Treatments / Results  Labs (all labs ordered are listed, but only abnormal results are displayed) Labs Reviewed - No data to display  EKG   Radiology No results found.  Procedures Procedures (including critical care time)  Medications Ordered in UC Medications  ketorolac (TORADOL) 30 MG/ML injection 30 mg (30 mg Intramuscular Given 01/14/22 1321)    Initial Impression / Assessment and Plan / UC Course  I have reviewed the triage vital signs and the nursing notes.  Pertinent labs & imaging results that were available during my care of the patient were reviewed by me and considered in my  medical decision making (see chart for details).    Amoxicillin prescribed cover dental abscess.  Toradol injection administered in office to hopefully help  with pain and inflammation.  Encouraged follow-up if symptoms fail to improve or worsen.  Final Clinical Impressions(s) / UC Diagnoses   Final diagnoses:  Dental abscess   Discharge Instructions   None    ED Prescriptions     Medication Sig Dispense Auth. Provider   amoxicillin (AMOXIL) 500 MG capsule Take 1 capsule (500 mg total) by mouth 3 (three) times daily. 21 capsule Francene Finders, PA-C      PDMP not reviewed this encounter.   Francene Finders, PA-C 01/14/22 1333

## 2022-01-19 ENCOUNTER — Encounter: Payer: Self-pay | Admitting: Emergency Medicine

## 2022-01-19 ENCOUNTER — Ambulatory Visit
Admission: EM | Admit: 2022-01-19 | Discharge: 2022-01-19 | Disposition: A | Discharge status: Home / Self Care | Payer: Self-pay | Attending: Physician Assistant | Admitting: Physician Assistant

## 2022-01-19 ENCOUNTER — Other Ambulatory Visit: Payer: Self-pay

## 2022-01-19 DIAGNOSIS — K047 Periapical abscess without sinus: Secondary | ICD-10-CM

## 2022-01-19 MED ORDER — NAPROXEN 500 MG PO TABS: 500.0000 mg | ORAL_TABLET | Freq: Two times a day (BID) | ORAL | 0 refills | Status: DC

## 2022-01-19 MED ORDER — CLINDAMYCIN HCL 300 MG PO CAPS: 300.0000 mg | ORAL_CAPSULE | Freq: Three times a day (TID) | ORAL | 0 refills | Status: AC

## 2022-01-19 NOTE — ED Triage Notes (Signed)
Patient states that he abscess is no better in mouth, patient currently on antbs.  Patient's dentist would not pull the tooth so she cancelled the appt.

## 2022-01-19 NOTE — ED Provider Notes (Signed)
[facility]    CSN: [id number] Arrival date & time: 01/19/22  8250      History   Chief Complaint Chief Complaint  Patient presents with   Abscess    HPI Jasmine Heath is a 51 y.o. female.   Patient here today for evaluation of dental abscess does not seem to be improving with amoxicillin previously prescribed.  She states that she contacted her dentist but they were unable to do anything due to continued infection so she canceled that appointment.  She does not report fever.  She does report significant pain associated with abscess.  She does have swelling of her mandible on the left side and has some difficulty opening her mouth fully due to same. She has been taking tylenol without significant relief. She notes Toradol injection at last office visit was helpful for about a day.   The history is provided by the patient.   Past Medical History:  Diagnosis Date   Asthma     Patient Active Problem List   Diagnosis Date Noted   Acute respiratory failure due to COVID-19 ([place]) 11/29/2020   Diabetes mellitus ([place]) 11/17/2012   Anemia 11/15/2012   Pain due to dental caries 11/15/2012   Status asthmaticus 11/14/2012   SOB (shortness of breath) 11/14/2012   Morbid obesity ([place]) 11/14/2012   Hypokalemia 11/14/2012    Past Surgical History:  Procedure Laterality Date   FOOT SURGERY     I+D    OB History   No obstetric history on file.      Home Medications    Prior to Admission medications   Medication Sig Start Date End Date Taking? Authorizing Provider  acetaminophen (TYLENOL) 325 MG tablet Take 2 tablets (650 mg total) by mouth every 6 (six) hours as needed for mild pain (or Fever >/= 101). 12/03/20  Yes Cherene Altes, MD  albuterol (PROVENTIL) (2.5 MG/3ML) 0.083% nebulizer solution Take 3 mLs (2.5 mg total) by nebulization every 6 (six) hours as needed for wheezing or shortness of breath. 05/30/20  Yes Jaynee Eagles, PA-C  albuterol (VENTOLIN HFA)  108 (90 Base) MCG/ACT inhaler Inhale 1-2 puffs into the lungs every 6 (six) hours as needed for wheezing or shortness of breath. 05/30/20  Yes Jaynee Eagles, PA-C  albuterol (VENTOLIN HFA) 108 (90 Base) MCG/ACT inhaler Inhale 1-2 puffs into the lungs every 6 (six) hours as needed for wheezing or shortness of breath. 04/10/21  Yes Rayna Sexton, PA-C  amoxicillin (AMOXIL) 500 MG capsule Take 1 capsule (500 mg total) by mouth 3 (three) times daily. 01/14/22  Yes Francene Finders, PA-C  clindamycin (CLEOCIN) 300 MG capsule Take 1 capsule (300 mg total) by mouth 3 (three) times daily for 7 days. 01/19/22 01/26/22 Yes Francene Finders, PA-C  guaiFENesin-dextromethorphan (ROBITUSSIN DM) 100-10 MG/5ML syrup Take 10 mLs by mouth every 4 (four) hours as needed for cough. 12/03/20  Yes Cherene Altes, MD  HYDROcodone-acetaminophen (NORCO) 5-325 MG tablet Take 2 tablets by mouth every 4 (four) hours as needed. 11/18/21  Yes Harris, Abigail, PA-C  naproxen (NAPROSYN) 500 MG tablet Take 1 tablet (500 mg total) by mouth 2 (two) times daily. 01/19/22  Yes Francene Finders, PA-C  ondansetron (ZOFRAN ODT) 4 MG disintegrating tablet Take 1 tablet (4 mg total) by mouth every 8 (eight) hours as needed for nausea or vomiting. 04/10/21  Yes Rayna Sexton, PA-C  predniSONE (DELTASONE) 20 MG tablet 2 tablets a day on 1/3 and 1/4, 1 tablet a  day on 1/5 and 1/6, half a tablet on 1/7 and 1/8, then stop 12/03/20  Yes Cherene Altes, MD    Family History Family History  Problem Relation Age of Onset   Asthma Mother     Social History Social History   Tobacco Use   Smoking status: Every Day    Packs/day: 0.25    Types: Cigarettes   Smokeless tobacco: Never  Vaping Use   Vaping Use: Never used  Substance Use Topics   Alcohol use: Yes    Comment: Reports she drinks on holidays    Drug use: No     Allergies   Patient has no known allergies.   Review of Systems Review of Systems  Constitutional:  Negative for  chills and fever.  HENT:  Positive for dental problem.   Eyes:  Negative for discharge and redness.  Respiratory:  Negative for shortness of breath.   Gastrointestinal:  Negative for abdominal pain, nausea and vomiting.    Physical Exam Triage Vital Signs ED Triage Vitals  Enc Vitals Group     BP      Pulse      Resp      Temp      Temp src      SpO2      Weight      Height      Head Circumference      Peak Flow      Pain Score      Pain Loc      Pain Edu?      Excl. in [place]?    No data found.  Updated Vital Signs BP 138/79 (BP Location: Left Arm)    Pulse 79    Temp 97.6 F (36.4 C) (Oral)    Ht 5\' 6"  (1.676 m)    Wt 270 lb 1 oz (122.5 kg)    SpO2 96%    BMI 43.59 kg/m   Physical Exam Vitals and nursing note reviewed.  Constitutional:      General: She is not in acute distress.    Appearance: Normal appearance. She is not ill-appearing.  HENT:     Head: Normocephalic and atraumatic.     Mouth/Throat:     Comments: Poor dentition throughout.  Gingival swelling noted to left lower teeth diffusely.  Swelling noted to left mandibular area Eyes:     Conjunctiva/sclera: Conjunctivae normal.  Cardiovascular:     Rate and Rhythm: Normal rate.  Pulmonary:     Effort: Pulmonary effort is normal.  Neurological:     Mental Status: She is alert.  Psychiatric:        Mood and Affect: Mood normal.        Behavior: Behavior normal.        Thought Content: Thought content normal.     UC Treatments / Results  Labs (all labs ordered are listed, but only abnormal results are displayed) Labs Reviewed - No data to display  EKG   Radiology No results found.  Procedures Procedures (including critical care time)  Medications Ordered in UC Medications - No data to display  Initial Impression / Assessment and Plan / UC Course  I have reviewed the triage vital signs and the nursing notes.  Pertinent labs & imaging results that were available during my care of the  patient were reviewed by me and considered in my medical decision making (see chart for details).   Will try clindamycin to hopefully clear dental infection and  encouraged her to follow up with dentist as scheduled this week regardless. Recommend ED with any worsening. Naproxen prescribed for hopeful pain relief and decreased swelling.   Final Clinical Impressions(s) / UC Diagnoses   Final diagnoses:  Dental abscess   Discharge Instructions   None    ED Prescriptions     Medication Sig Dispense Auth. Provider   naproxen (NAPROSYN) 500 MG tablet Take 1 tablet (500 mg total) by mouth 2 (two) times daily. 30 tablet Ewell Poe F, PA-C   clindamycin (CLEOCIN) 300 MG capsule Take 1 capsule (300 mg total) by mouth 3 (three) times daily for 7 days. 21 capsule Francene Finders, PA-C      PDMP not reviewed this encounter.   Francene Finders, PA-C 01/19/22 1143

## 2022-06-15 ENCOUNTER — Other Ambulatory Visit: Payer: Self-pay

## 2022-06-15 ENCOUNTER — Emergency Department (HOSPITAL_COMMUNITY)
Admission: EM | Admit: 2022-06-15 | Discharge: 2022-06-15 | Disposition: A | Discharge status: Home / Self Care | Payer: Self-pay | Attending: Emergency Medicine | Admitting: Emergency Medicine

## 2022-06-15 ENCOUNTER — Encounter (HOSPITAL_COMMUNITY): Payer: Self-pay

## 2022-06-15 DIAGNOSIS — M5412 Radiculopathy, cervical region: Secondary | ICD-10-CM | POA: Insufficient documentation

## 2022-06-15 DIAGNOSIS — F17210 Nicotine dependence, cigarettes, uncomplicated: Secondary | ICD-10-CM | POA: Insufficient documentation

## 2022-06-15 DIAGNOSIS — Z7951 Long term (current) use of inhaled steroids: Secondary | ICD-10-CM | POA: Insufficient documentation

## 2022-06-15 DIAGNOSIS — J45909 Unspecified asthma, uncomplicated: Secondary | ICD-10-CM | POA: Insufficient documentation

## 2022-06-15 MED ORDER — NAPROXEN 500 MG PO TABS: 500.0000 mg | ORAL_TABLET | Freq: Once | ORAL | Status: DC

## 2022-06-15 MED ORDER — KETOROLAC TROMETHAMINE 30 MG/ML IJ SOLN
30.0000 mg | Freq: Once | INTRAMUSCULAR | Status: AC
  Administered 2022-06-15: 30 mg via INTRAMUSCULAR
  Filled 2022-06-15: qty 1

## 2022-06-15 MED ORDER — HYDROCODONE-ACETAMINOPHEN 5-325 MG PO TABS: 1.0000 | ORAL_TABLET | Freq: Four times a day (QID) | ORAL | 0 refills | Status: DC | PRN

## 2022-06-15 MED ORDER — NAPROXEN 375 MG PO TABS: ORAL_TABLET | ORAL | 0 refills | Status: DC

## 2022-06-15 NOTE — ED Provider Notes (Addendum)
[facility] DEPT Provider Note: Jasmine Spurling, MD, FACEP  CSN: [id number] MRN: [id number] ARRIVAL: 06/15/22 at [place]: [name]  Neck Pain   HISTORY OF PRESENT ILLNESS  06/15/22 5:55 AM Jasmine Heath is a 51 y.o. female with pain in her neck for the past 10 days.  The pain goes down into her back and down her right arm in about the C6 dermatome.  She was using IcyHot and it got better but the pain has subsequently worsened.  She uses a new machine at work that causes her to twist and turn a lot.  She rates the pain as a 10 out of 10, aching in nature.  It is worse with movement of her neck.  She denies any motor weakness.   Past Medical History:  Diagnosis Date   Asthma     Past Surgical History:  Procedure Laterality Date   FOOT SURGERY     I+D    Family History  Problem Relation Age of Onset   Asthma Mother     Social History   Tobacco Use   Smoking status: Every Day    Packs/day: 0.25    Types: Cigarettes   Smokeless tobacco: Never  Vaping Use   Vaping Use: Never used  Substance Use Topics   Alcohol use: Yes    Comment: Reports she drinks on holidays    Drug use: No    Prior to Admission medications   Medication Sig Start Date End Date Taking? Authorizing Provider  HYDROcodone-acetaminophen (NORCO) 5-325 MG tablet Take 1 tablet by mouth every 6 (six) hours as needed for severe pain. 06/15/22  Yes Jeralyn Nolden, MD  naproxen (NAPROSYN) 375 MG tablet Take 1 tablet twice daily as needed for neck pain. 06/15/22  Yes Rigoberto Repass, MD  albuterol (PROVENTIL) (2.5 MG/3ML) 0.083% nebulizer solution Take 3 mLs (2.5 mg total) by nebulization every 6 (six) hours as needed for wheezing or shortness of breath. 05/30/20   Jaynee Eagles, PA-C  albuterol (VENTOLIN HFA) 108 (90 Base) MCG/ACT inhaler Inhale 1-2 puffs into the lungs every 6 (six) hours as needed for wheezing or shortness of breath. 05/30/20   Jaynee Eagles, PA-C  albuterol (VENTOLIN HFA) 108  (90 Base) MCG/ACT inhaler Inhale 1-2 puffs into the lungs every 6 (six) hours as needed for wheezing or shortness of breath. 04/10/21   Rayna Sexton, PA-C    Allergies Patient has no known allergies.   REVIEW OF SYSTEMS  Negative except as noted here or in the History of Present Illness.   PHYSICAL EXAMINATION  Initial Vital Signs Blood pressure (!) 146/71, pulse 87, temperature 97.6 F (36.4 C), temperature source Oral, resp. rate 19, height '5\' 5"'$  (1.651 m), weight 122.5 kg, SpO2 94 %.  Examination General: Well-developed, well-nourished female in no acute distress; appearance consistent with age of record HENT: normocephalic; atraumatic Eyes: Normal appearance Neck: supple; movement of neck reproduces pain Heart: regular rate and rhythm Lungs: clear to auscultation bilaterally Abdomen: soft; nondistended; nontender; bowel sounds present Extremities: No deformity; full range of motion Neurologic: Awake, alert and oriented; motor function intact in all extremities and symmetric; no facial droop Skin: Warm and dry Psychiatric: Normal mood and affect   RESULTS  Summary of this visit's results, reviewed and interpreted by myself:   EKG Interpretation  Date/Time:    Ventricular Rate:    PR Interval:    QRS Duration:   QT Interval:    QTC Calculation:   R Axis:  Text Interpretation:         Laboratory Studies: No results found for this or any previous visit (from the past 24 hour(s)). Imaging Studies: No results found.  ED COURSE and MDM  Nursing notes, initial and subsequent vitals signs, including pulse oximetry, reviewed and interpreted by myself.  Vitals:   06/15/22 0548 06/15/22 0549  BP: (!) 146/71   Pulse: 87   Resp: 19   Temp: 97.6 F (36.4 C)   TempSrc: Oral   SpO2: 94%   Weight:  122.5 kg  Height:  '5\' 5"'$  (1.651 m)   Medications  ketorolac (TORADOL) 30 MG/ML injection 30 mg (has no administration in time range)    Presentation  consistent with cervical radiculopathy, likely C6 dermatome on the right.  Symptoms are worse with movement of the neck and the pain radiates in a matter consistent with a C6 radiculopathy.  We will treat with an NSAID and narcotic and refer to neurosurgery.  PROCEDURES  Procedures   ED DIAGNOSES     ICD-10-CM   1. Cervical radiculopathy at C6  M54.12          Yeraldy Spike, Jenny Reichmann, MD 06/15/22 0604    Shanon Rosser, MD 06/15/22 [phone]

## 2022-06-15 NOTE — ED Triage Notes (Signed)
Pt states that she thought she pulled a muscle at work 10 days ago, pt was having neck pain that went down her back and down her right arm. Pt reports using icy hot and it got better for her but the pain is still there. Pt states that she uses a new machine at work that causes her to twist and turn.

## 2022-09-01 ENCOUNTER — Ambulatory Visit
Admission: EM | Admit: 2022-09-01 | Discharge: 2022-09-01 | Disposition: A | Discharge status: Home / Self Care | Payer: Self-pay | Attending: Physician Assistant | Admitting: Physician Assistant

## 2022-09-01 DIAGNOSIS — R1013 Epigastric pain: Secondary | ICD-10-CM

## 2022-09-01 MED ORDER — OMEPRAZOLE 20 MG PO CPDR: 20.0000 mg | DELAYED_RELEASE_CAPSULE | Freq: Every day | ORAL | 0 refills | Status: DC

## 2022-09-01 NOTE — ED Provider Notes (Signed)
[facility]    CSN: [id number] Arrival date & time: 09/01/22  1438      History   Chief Complaint Chief Complaint  Patient presents with   Abdominal Pain   Nausea    HPI Jasmine Heath is a 51 y.o. female.   Patient here today for evaluation of generalized abdominal pain that started 3 days ago.  She reports she has had some nausea but no consistent vomiting or diarrhea.  She has had bowel movements but reports some constipation.  She has not had any blood in her stool or dark tarry stools.  She has not had any fever.  She has not taken any medication for symptoms.  She does note that her epigastric area is where pain is located mostly, as well as in her left upper quadrant and seems to be worse in the mornings noting she feels as if she is "more hungry".  The history is provided by the patient.    Past Medical History:  Diagnosis Date   Asthma     Patient Active Problem List   Diagnosis Date Noted   Acute respiratory failure due to COVID-19 ([place]) 11/29/2020   Diabetes mellitus ([place]) 11/17/2012   Anemia 11/15/2012   Pain due to dental caries 11/15/2012   Status asthmaticus 11/14/2012   SOB (shortness of breath) 11/14/2012   Morbid obesity ([place]) 11/14/2012   Hypokalemia 11/14/2012    Past Surgical History:  Procedure Laterality Date   FOOT SURGERY     I+D    OB History   No obstetric history on file.      Home Medications    Prior to Admission medications   Medication Sig Start Date End Date Taking? Authorizing Provider  omeprazole (PRILOSEC) 20 MG capsule Take 1 capsule (20 mg total) by mouth daily. 09/01/22  Yes Francene Finders, PA-C  albuterol (PROVENTIL) (2.5 MG/3ML) 0.083% nebulizer solution Take 3 mLs (2.5 mg total) by nebulization every 6 (six) hours as needed for wheezing or shortness of breath. 05/30/20   Jaynee Eagles, PA-C  albuterol (VENTOLIN HFA) 108 (90 Base) MCG/ACT inhaler Inhale 1-2 puffs into the lungs every 6 (six) hours as  needed for wheezing or shortness of breath. 05/30/20   Jaynee Eagles, PA-C  albuterol (VENTOLIN HFA) 108 (90 Base) MCG/ACT inhaler Inhale 1-2 puffs into the lungs every 6 (six) hours as needed for wheezing or shortness of breath. 04/10/21   Rayna Sexton, PA-C  HYDROcodone-acetaminophen (NORCO) 5-325 MG tablet Take 1 tablet by mouth every 6 (six) hours as needed for severe pain. 06/15/22   Molpus, John, MD  naproxen (NAPROSYN) 375 MG tablet Take 1 tablet twice daily as needed for neck pain. 06/15/22   Molpus, John, MD    Family History Family History  Problem Relation Age of Onset   Asthma Mother     Social History Social History   Tobacco Use   Smoking status: Every Day    Packs/day: 0.25    Types: Cigarettes   Smokeless tobacco: Never  Vaping Use   Vaping Use: Never used  Substance Use Topics   Alcohol use: Yes    Comment: Reports she drinks on holidays    Drug use: No     Allergies   Patient has no known allergies.   Review of Systems Review of Systems  Constitutional:  Negative for chills and fever.  Eyes:  Negative for discharge and redness.  Gastrointestinal:  Positive for abdominal pain and nausea. Negative for vomiting.  Physical Exam Triage Vital Signs ED Triage Vitals  Enc Vitals Group     BP      Pulse      Resp      Temp      Temp src      SpO2      Weight      Height      Head Circumference      Peak Flow      Pain Score      Pain Loc      Pain Edu?      Excl. in [place]?    No data found.  Updated Vital Signs BP 111/72   Pulse 77   Temp 98 F (36.7 C) (Oral)   Resp 18   SpO2 93%      Physical Exam Vitals and nursing note reviewed.  Constitutional:      General: She is not in acute distress.    Appearance: Normal appearance. She is not ill-appearing.  HENT:     Head: Normocephalic and atraumatic.  Eyes:     Conjunctiva/sclera: Conjunctivae normal.  Cardiovascular:     Rate and Rhythm: Normal rate and regular rhythm.  Pulmonary:      Effort: Pulmonary effort is normal. No respiratory distress.     Breath sounds: Normal breath sounds. No wheezing, rhonchi or rales.  Abdominal:     General: Abdomen is flat. Bowel sounds are normal. There is no distension.     Palpations: Abdomen is soft.     Tenderness: There is no abdominal tenderness. There is no guarding or rebound.  Neurological:     Mental Status: She is alert.  Psychiatric:        Mood and Affect: Mood normal.        Behavior: Behavior normal.        Thought Content: Thought content normal.      UC Treatments / Results  Labs (all labs ordered are listed, but only abnormal results are displayed) Labs Reviewed - No data to display  EKG   Radiology No results found.  Procedures Procedures (including critical care time)  Medications Ordered in UC Medications - No data to display  Initial Impression / Assessment and Plan / UC Course  I have reviewed the triage vital signs and the nursing notes.  Pertinent labs & imaging results that were available during my care of the patient were reviewed by me and considered in my medical decision making (see chart for details).    Unknown etiology of symptoms but will treat with omeprazole given worsened discomfort in the morning.  Encouraged follow-up if no gradual improvement and further evaluation in the emergency room with any worsening.  Recommended MiraLAX to help with bowel movements, and again recommended evaluation in the emergency room with any worsening symptoms that she may need imaging.  Patient expresses understanding.  Final Clinical Impressions(s) / UC Diagnoses   Final diagnoses:  Abdominal pain, epigastric   Discharge Instructions   None    ED Prescriptions     Medication Sig Dispense Auth. Provider   omeprazole (PRILOSEC) 20 MG capsule Take 1 capsule (20 mg total) by mouth daily. 30 capsule Francene Finders, PA-C      PDMP not reviewed this encounter.   Francene Finders,  PA-C 09/01/22 [phone]

## 2022-09-01 NOTE — ED Triage Notes (Signed)
Pt presents with generalized abdominal pain with nausea X 3 days.

## 2022-10-29 ENCOUNTER — Encounter: Payer: Self-pay | Admitting: Emergency Medicine

## 2022-10-29 ENCOUNTER — Ambulatory Visit
Admission: EM | Admit: 2022-10-29 | Discharge: 2022-10-29 | Disposition: A | Discharge status: Home / Self Care | Payer: No Typology Code available for payment source | Attending: Internal Medicine | Admitting: Internal Medicine

## 2022-10-29 DIAGNOSIS — J069 Acute upper respiratory infection, unspecified: Secondary | ICD-10-CM | POA: Diagnosis not present

## 2022-10-29 DIAGNOSIS — J4521 Mild intermittent asthma with (acute) exacerbation: Secondary | ICD-10-CM

## 2022-10-29 DIAGNOSIS — R062 Wheezing: Secondary | ICD-10-CM

## 2022-10-29 LAB — POCT INFLUENZA A/B
Influenza A, POC: NEGATIVE
Influenza B, POC: NEGATIVE

## 2022-10-29 MED ORDER — ALBUTEROL SULFATE (2.5 MG/3ML) 0.083% IN NEBU: 2.5000 mg | INHALATION_SOLUTION | Freq: Four times a day (QID) | RESPIRATORY_TRACT | 12 refills | Status: DC | PRN

## 2022-10-29 MED ORDER — BENZONATATE 100 MG PO CAPS: 100.0000 mg | ORAL_CAPSULE | Freq: Three times a day (TID) | ORAL | 0 refills | Status: DC | PRN

## 2022-10-29 MED ORDER — PREDNISONE 20 MG PO TABS: 40.0000 mg | ORAL_TABLET | Freq: Every day | ORAL | 0 refills | Status: AC

## 2022-10-29 MED ORDER — IPRATROPIUM-ALBUTEROL 0.5-2.5 (3) MG/3ML IN SOLN
3.0000 mL | Freq: Once | RESPIRATORY_TRACT | Status: AC
  Administered 2022-10-29: 3 mL via RESPIRATORY_TRACT

## 2022-10-29 MED ORDER — METHYLPREDNISOLONE SODIUM SUCC 125 MG IJ SOLR
80.0000 mg | Freq: Once | INTRAMUSCULAR | Status: AC
  Administered 2022-10-29: 80 mg via INTRAMUSCULAR

## 2022-10-29 NOTE — ED Triage Notes (Signed)
Pt is present today with c/o SOB x2 days ago. Pt states that she used her inhaler 30 mins ago

## 2022-10-29 NOTE — Discharge Instructions (Signed)
Your flu was negative.  I suspect that you are having an asthma flareup due to viral illness as we discussed.  I have refilled your albuterol nebulizer medication.  I have also sent you a cough medication and prednisone.  Please start taking prednisone tomorrow given that you received a steroid injection today in urgent care.  Follow-up if symptoms persist or worsen.

## 2022-10-29 NOTE — ED Provider Notes (Signed)
[facility]    CSN: [id number] Arrival date & time: 10/29/22  1241      History   Chief Complaint Chief Complaint  Patient presents with   Shortness of Breath    HPI Jasmine Heath is a 51 y.o. female.   Patient presents with shortness of breath, cough, nasal congestion, fever, wheezing that started 2 days ago.  Patient reports history of asthma and has been having to use albuterol inhaler very frequently with no improvement.  Patient also reports that family members recently tested positive for the flu.  Denies chest pain, sore throat, ear pain, nausea, vomiting, diarrhea, abdominal pain.  Patient has also taken DayQuil with minimal improvement in symptoms. Has taken several covid tests at home that were negative.    Shortness of Breath   Past Medical History:  Diagnosis Date   Asthma     Patient Active Problem List   Diagnosis Date Noted   Acute respiratory failure due to COVID-19 ([place]) 11/29/2020   Diabetes mellitus ([place]) 11/17/2012   Anemia 11/15/2012   Pain due to dental caries 11/15/2012   Status asthmaticus 11/14/2012   SOB (shortness of breath) 11/14/2012   Morbid obesity ([place]) 11/14/2012   Hypokalemia 11/14/2012    Past Surgical History:  Procedure Laterality Date   FOOT SURGERY     I+D    OB History   No obstetric history on file.      Home Medications    Prior to Admission medications   Medication Sig Start Date End Date Taking? Authorizing Provider  albuterol (PROVENTIL) (2.5 MG/3ML) 0.083% nebulizer solution Take 3 mLs (2.5 mg total) by nebulization every 6 (six) hours as needed for wheezing or shortness of breath. 10/29/22  Yes Chinonso Linker, Hildred Alamin E, FNP  benzonatate (TESSALON) 100 MG capsule Take 1 capsule (100 mg total) by mouth every 8 (eight) hours as needed for cough. 10/29/22  Yes Zakaria Sedor, Hildred Alamin E, FNP  predniSONE (DELTASONE) 20 MG tablet Take 2 tablets (40 mg total) by mouth daily for 5 days. 10/29/22 11/03/22 Yes Prescott Truex, Michele Rockers,  FNP  albuterol (VENTOLIN HFA) 108 (90 Base) MCG/ACT inhaler Inhale 1-2 puffs into the lungs every 6 (six) hours as needed for wheezing or shortness of breath. 05/30/20   Jaynee Eagles, PA-C  albuterol (VENTOLIN HFA) 108 (90 Base) MCG/ACT inhaler Inhale 1-2 puffs into the lungs every 6 (six) hours as needed for wheezing or shortness of breath. 04/10/21   Rayna Sexton, PA-C  HYDROcodone-acetaminophen (NORCO) 5-325 MG tablet Take 1 tablet by mouth every 6 (six) hours as needed for severe pain. 06/15/22   Molpus, John, MD  naproxen (NAPROSYN) 375 MG tablet Take 1 tablet twice daily as needed for neck pain. 06/15/22   Molpus, John, MD  omeprazole (PRILOSEC) 20 MG capsule Take 1 capsule (20 mg total) by mouth daily. 09/01/22   Francene Finders, PA-C    Family History Family History  Problem Relation Age of Onset   Asthma Mother     Social History Social History   Tobacco Use   Smoking status: Every Day    Packs/day: 0.25    Types: Cigarettes   Smokeless tobacco: Never  Vaping Use   Vaping Use: Never used  Substance Use Topics   Alcohol use: Yes    Comment: Reports she drinks on holidays    Drug use: No     Allergies   Patient has no known allergies.   Review of Systems Review of Systems Per HPI  Physical  Exam Triage Vital Signs ED Triage Vitals  Enc Vitals Group     BP 10/29/22 1502 99/73     Pulse Rate 10/29/22 1502 85     Resp 10/29/22 1502 18     Temp 10/29/22 1502 98 F (36.7 C)     Temp src --      SpO2 10/29/22 1502 97 %     Weight --      Height --      Head Circumference --      Peak Flow --      Pain Score 10/29/22 1500 0     Pain Loc --      Pain Edu? --      Excl. in [place]? --    No data found.  Updated Vital Signs BP 99/73   Pulse 85   Temp 98 F (36.7 C)   Resp 18   SpO2 97%   Visual Acuity Right Eye Distance:   Left Eye Distance:   Bilateral Distance:    Right Eye Near:   Left Eye Near:    Bilateral Near:     Physical  Exam Constitutional:      General: She is not in acute distress.    Appearance: Normal appearance. She is not toxic-appearing or diaphoretic.  HENT:     Head: Normocephalic and atraumatic.     Right Ear: Tympanic membrane and ear canal normal.     Left Ear: Tympanic membrane and ear canal normal.     Nose: Congestion present.     Mouth/Throat:     Mouth: Mucous membranes are moist.     Pharynx: No posterior oropharyngeal erythema.  Eyes:     Extraocular Movements: Extraocular movements intact.     Conjunctiva/sclera: Conjunctivae normal.     Pupils: Pupils are equal, round, and reactive to light.  Cardiovascular:     Rate and Rhythm: Normal rate and regular rhythm.     Pulses: Normal pulses.     Heart sounds: Normal heart sounds.  Pulmonary:     Effort: Pulmonary effort is normal. No respiratory distress.     Breath sounds: No stridor. Wheezing present. No rhonchi or rales.     Comments: Audible wheezing noted on exam.  Abdominal:     General: Abdomen is flat. Bowel sounds are normal.     Palpations: Abdomen is soft.  Musculoskeletal:        General: Normal range of motion.     Cervical back: Normal range of motion.  Skin:    General: Skin is warm and dry.  Neurological:     General: No focal deficit present.     Mental Status: She is alert and oriented to person, place, and time. Mental status is at baseline.  Psychiatric:        Mood and Affect: Mood normal.        Behavior: Behavior normal.      UC Treatments / Results  Labs (all labs ordered are listed, but only abnormal results are displayed) Labs Reviewed  POCT INFLUENZA A/B    EKG   Radiology No results found.  Procedures Procedures (including critical care time)  Medications Ordered in UC Medications  ipratropium-albuterol (DUONEB) 0.5-2.5 (3) MG/3ML nebulizer solution 3 mL (3 mLs Nebulization Given 10/29/22 1514)  methylPREDNISolone sodium succinate (SOLU-MEDROL) 125 mg/2 mL injection 80 mg (80 mg  Intramuscular Given 10/29/22 1513)    Initial Impression / Assessment and Plan / UC Course  I have reviewed the  triage vital signs and the nursing notes.  Pertinent labs & imaging results that were available during my care of the patient were reviewed by me and considered in my medical decision making (see chart for details).     Patient presents with symptoms likely from a viral upper respiratory infection. Differential includes bacterial pneumonia, sinusitis, allergic rhinitis, Covid 19, flu, RSV.  Suspect asthma exacerbation.  Nebulizer treatment was administered in urgent care with improvement in lung sounds and patient stating that she felt better.  Vital signs and patient is stable at discharge.  Patient is nontoxic appearing and not in need of emergent medical intervention.  Rapid flu was negative.  COVID test deferred given that patient had several COVID test at home that were negative.  IM steroid also administered today.  I do think patient would benefit from prednisone.  Patient advised to start taking prednisone tomorrow given she received an IM steroid today in urgent care.  Patient has taken this before multiple times and tolerated well.  No obvious contraindications to steroids noted in patient's history.  Recommended symptom control with medications to treat symptoms as well.  Patient sent prescriptions.  Refilled patient's albuterol nebulizer treatments as well for patient to take as needed.  She states that she has a nebulizer machine at home but needs a refill of the medication.  Advised patient that nebulizer treatment may be more beneficial than inhaler with acute illness.  Return if symptoms fail to improve. Strict return and ER precautions given. Patient states understanding and is agreeable.  Discharged with PCP followup.  Final Clinical Impressions(s) / UC Diagnoses   Final diagnoses:  Mild intermittent asthma with acute exacerbation  Viral upper respiratory tract  infection with cough  Wheezing     Discharge Instructions      Your flu was negative.  I suspect that you are having an asthma flareup due to viral illness as we discussed.  I have refilled your albuterol nebulizer medication.  I have also sent you a cough medication and prednisone.  Please start taking prednisone tomorrow given that you received a steroid injection today in urgent care.  Follow-up if symptoms persist or worsen.     ED Prescriptions     Medication Sig Dispense Auth. Provider   predniSONE (DELTASONE) 20 MG tablet Take 2 tablets (40 mg total) by mouth daily for 5 days. 10 tablet [place], Victoria E, [place]   benzonatate (TESSALON) 100 MG capsule Take 1 capsule (100 mg total) by mouth every 8 (eight) hours as needed for cough. 21 capsule [place], [place] E, [place]   albuterol (PROVENTIL) (2.5 MG/3ML) 0.083% nebulizer solution Take 3 mLs (2.5 mg total) by nebulization every 6 (six) hours as needed for wheezing or shortness of breath. 75 mL Teodora Medici, [place]      PDMP not reviewed this encounter.   Teodora Medici, [place] 10/29/22 [phone]

## 2022-11-19 ENCOUNTER — Ambulatory Visit
Admission: EM | Admit: 2022-11-19 | Discharge: 2022-11-19 | Disposition: A | Discharge status: Home / Self Care | Payer: No Typology Code available for payment source | Attending: Physician Assistant | Admitting: Physician Assistant

## 2022-11-19 DIAGNOSIS — K047 Periapical abscess without sinus: Secondary | ICD-10-CM | POA: Diagnosis not present

## 2022-11-19 MED ORDER — AMOXICILLIN 500 MG PO CAPS: 500.0000 mg | ORAL_CAPSULE | Freq: Three times a day (TID) | ORAL | 0 refills | Status: DC

## 2022-11-19 MED ORDER — IBUPROFEN 600 MG PO TABS: 600.0000 mg | ORAL_TABLET | Freq: Four times a day (QID) | ORAL | 0 refills | Status: DC | PRN

## 2022-11-19 NOTE — Discharge Instructions (Signed)
Please follow up with dentist as soon as possible.   Report to ED with any worsening symptoms.

## 2022-11-19 NOTE — ED Triage Notes (Signed)
Pt presents with right side dental pain X 3 days.

## 2022-11-19 NOTE — ED Provider Notes (Signed)
[facility]    CSN: [id number] Arrival date & time: 11/19/22  1022      History   Chief Complaint Chief Complaint  Patient presents with   Dental Problem    HPI Jasmine Heath is a 51 y.o. female.   Patient here today for evaluation of dental pain to her right lower molars. She feels symptoms are consistent with abscess. She reports symptoms have been present for the last 3 days and she did seem to have fever last night. She has taken OTC meds for pain without resolution.   The history is provided by the patient.    Past Medical History:  Diagnosis Date   Asthma     Patient Active Problem List   Diagnosis Date Noted   Acute respiratory failure due to COVID-19 ([place]) 11/29/2020   Diabetes mellitus ([place]) 11/17/2012   Anemia 11/15/2012   Pain due to dental caries 11/15/2012   Status asthmaticus 11/14/2012   SOB (shortness of breath) 11/14/2012   Morbid obesity ([place]) 11/14/2012   Hypokalemia 11/14/2012    Past Surgical History:  Procedure Laterality Date   FOOT SURGERY     I+D    OB History   No obstetric history on file.      Home Medications    Prior to Admission medications   Medication Sig Start Date End Date Taking? Authorizing Provider  amoxicillin (AMOXIL) 500 MG capsule Take 1 capsule (500 mg total) by mouth 3 (three) times daily. 11/19/22  Yes Francene Finders, PA-C  ibuprofen (ADVIL) 600 MG tablet Take 1 tablet (600 mg total) by mouth every 6 (six) hours as needed. 11/19/22  Yes Francene Finders, PA-C  albuterol (PROVENTIL) (2.5 MG/3ML) 0.083% nebulizer solution Take 3 mLs (2.5 mg total) by nebulization every 6 (six) hours as needed for wheezing or shortness of breath. 10/29/22   Teodora Medici, FNP  albuterol (VENTOLIN HFA) 108 (90 Base) MCG/ACT inhaler Inhale 1-2 puffs into the lungs every 6 (six) hours as needed for wheezing or shortness of breath. 05/30/20   Jaynee Eagles, PA-C  albuterol (VENTOLIN HFA) 108 (90 Base) MCG/ACT inhaler  Inhale 1-2 puffs into the lungs every 6 (six) hours as needed for wheezing or shortness of breath. 04/10/21   Rayna Sexton, PA-C  benzonatate (TESSALON) 100 MG capsule Take 1 capsule (100 mg total) by mouth every 8 (eight) hours as needed for cough. 10/29/22   Teodora Medici, FNP  HYDROcodone-acetaminophen (NORCO) 5-325 MG tablet Take 1 tablet by mouth every 6 (six) hours as needed for severe pain. 06/15/22   Molpus, John, MD  omeprazole (PRILOSEC) 20 MG capsule Take 1 capsule (20 mg total) by mouth daily. 09/01/22   Francene Finders, PA-C    Family History Family History  Problem Relation Age of Onset   Asthma Mother     Social History Social History   Tobacco Use   Smoking status: Every Day    Packs/day: 0.25    Types: Cigarettes   Smokeless tobacco: Never  Vaping Use   Vaping Use: Never used  Substance Use Topics   Alcohol use: Yes    Comment: Reports she drinks on holidays    Drug use: No     Allergies   Patient has no known allergies.   Review of Systems Review of Systems  Constitutional:  Negative for chills and fever.  HENT:  Positive for dental problem.   Eyes:  Negative for discharge and redness.  Respiratory:  Negative for  shortness of breath.   Gastrointestinal:  Negative for nausea and vomiting.     Physical Exam Triage Vital Signs ED Triage Vitals  Enc Vitals Group     BP 11/19/22 1214 (!) 131/91     Pulse Rate 11/19/22 1214 95     Resp 11/19/22 1214 18     Temp 11/19/22 1214 98.3 F (36.8 C)     Temp Source 11/19/22 1214 Oral     SpO2 11/19/22 1214 93 %     Weight --      Height --      Head Circumference --      Peak Flow --      Pain Score 11/19/22 1215 9     Pain Loc --      Pain Edu? --      Excl. in [place]? --    No data found.  Updated Vital Signs BP (!) 131/91 (BP Location: Left Arm)   Pulse 95   Temp 98.3 F (36.8 C) (Oral)   Resp 18   SpO2 93%       Physical Exam Vitals and nursing note reviewed.  Constitutional:       General: She is not in acute distress.    Appearance: Normal appearance. She is not ill-appearing.  HENT:     Head: Normocephalic and atraumatic.     Mouth/Throat:     Mouth: Mucous membranes are moist.     Comments: Poor dentition throughout, several missing teeth, swelling appreciated to right lower gumline Eyes:     Conjunctiva/sclera: Conjunctivae normal.  Cardiovascular:     Rate and Rhythm: Normal rate.  Pulmonary:     Effort: Pulmonary effort is normal.  Neurological:     Mental Status: She is alert.  Psychiatric:        Mood and Affect: Mood normal.        Behavior: Behavior normal.        Thought Content: Thought content normal.      UC Treatments / Results  Labs (all labs ordered are listed, but only abnormal results are displayed) Labs Reviewed - No data to display  EKG   Radiology No results found.  Procedures Procedures (including critical care time)  Medications Ordered in UC Medications - No data to display  Initial Impression / Assessment and Plan / UC Course  I have reviewed the triage vital signs and the nursing notes.  Pertinent labs & imaging results that were available during my care of the patient were reviewed by me and considered in my medical decision making (see chart for details).    Will treat to cover abscess with amoxicillin and recommended follow up with dentistry. Ibuprofen prescribed for treatment of pain. Recommended further evaluation in ED with any worsening.   Final Clinical Impressions(s) / UC Diagnoses   Final diagnoses:  Dental abscess     Discharge Instructions      Please follow up with dentist as soon as possible.   Report to ED with any worsening symptoms.     ED Prescriptions     Medication Sig Dispense Auth. Provider   amoxicillin (AMOXIL) 500 MG capsule Take 1 capsule (500 mg total) by mouth 3 (three) times daily. 21 capsule Ewell Poe F, PA-C   ibuprofen (ADVIL) 600 MG tablet Take 1 tablet (600 mg  total) by mouth every 6 (six) hours as needed. 30 tablet Francene Finders, PA-C      PDMP not reviewed this encounter.  Francene Finders, PA-C 11/19/22 1317

## 2023-05-16 ENCOUNTER — Ambulatory Visit
Admission: EM | Admit: 2023-05-16 | Discharge: 2023-05-16 | Disposition: A | Discharge status: Home / Self Care | Payer: No Typology Code available for payment source

## 2023-05-16 DIAGNOSIS — K0889 Other specified disorders of teeth and supporting structures: Secondary | ICD-10-CM

## 2023-05-16 DIAGNOSIS — K047 Periapical abscess without sinus: Secondary | ICD-10-CM

## 2023-05-16 MED ORDER — AMOXICILLIN-POT CLAVULANATE 875-125 MG PO TABS: 1.0000 | ORAL_TABLET | Freq: Two times a day (BID) | ORAL | 0 refills | Status: DC

## 2023-05-16 NOTE — ED Provider Notes (Signed)
[facility]    CSN: [id number] Arrival date & time: 05/16/23  1503      History   Chief Complaint Chief Complaint  Patient presents with   Dental Pain        Headache    HPI Jasmine Heath is a 52 y.o. female.   Patient presents with dental pain throughout mouth with majority being on the right lower portion.  Reports that this started a few days ago.  Patient has a history of dental infections with last 1 being a few months prior.  Patient reports that they called a dentist and has an appointment for July 9.  Patient has taken Tylenol for pain.  Denies any fever or trauma to the mouth.   Dental Pain Headache   Past Medical History:  Diagnosis Date   Asthma     Patient Active Problem List   Diagnosis Date Noted   Acute respiratory failure due to COVID-19 ([facility]) 11/29/2020   Diabetes mellitus ([facility]) 11/17/2012   Anemia 11/15/2012   Pain due to dental caries 11/15/2012   Status asthmaticus 11/14/2012   SOB (shortness of breath) 11/14/2012   Morbid obesity ([facility]) 11/14/2012   Hypokalemia 11/14/2012    Past Surgical History:  Procedure Laterality Date   FOOT SURGERY     I+D    OB History   No obstetric history on file.      Home Medications    Prior to Admission medications   Medication Sig Start Date End Date Taking? Authorizing Provider  acetaminophen (TYLENOL) 500 MG tablet Take 500 mg by mouth every 6 (six) hours as needed.   Yes [provider]  amoxicillin-clavulanate (AUGMENTIN) 875-125 MG tablet Take 1 tablet by mouth every 12 (twelve) hours. 05/16/23  Yes Breyden Jeudy, Rolly Salter E, FNP  albuterol (PROVENTIL) (2.5 MG/3ML) 0.083% nebulizer solution Take 3 mLs (2.5 mg total) by nebulization every 6 (six) hours as needed for wheezing or shortness of breath. 10/29/22   Gustavus Bryant, FNP  albuterol (VENTOLIN HFA) 108 (90 Base) MCG/ACT inhaler Inhale 1-2 puffs into the lungs every 6 (six) hours as needed for wheezing or shortness of breath.  05/30/20   Wallis Bamberg, PA-C  albuterol (VENTOLIN HFA) 108 (90 Base) MCG/ACT inhaler Inhale 1-2 puffs into the lungs every 6 (six) hours as needed for wheezing or shortness of breath. 04/10/21   Placido Sou, PA-C  benzonatate (TESSALON) 100 MG capsule Take 1 capsule (100 mg total) by mouth every 8 (eight) hours as needed for cough. 10/29/22   Gustavus Bryant, FNP  HYDROcodone-acetaminophen (NORCO) 5-325 MG tablet Take 1 tablet by mouth every 6 (six) hours as needed for severe pain. 06/15/22   Molpus, John, MD  ibuprofen (ADVIL) 600 MG tablet Take 1 tablet (600 mg total) by mouth every 6 (six) hours as needed. 11/19/22   Tomi Bamberger, PA-C  omeprazole (PRILOSEC) 20 MG capsule Take 1 capsule (20 mg total) by mouth daily. 09/01/22   Tomi Bamberger, PA-C    Family History Family History  Problem Relation Age of Onset   Asthma Mother     Social History Social History   Tobacco Use   Smoking status: Every Day    Packs/day: .25    Types: Cigarettes   Smokeless tobacco: Never  Vaping Use   Vaping Use: Never used  Substance Use Topics   Alcohol use: Yes    Comment: Reports she drinks on holidays    Drug use: No  Allergies   Patient has no known allergies.   Review of Systems Review of Systems Per HPI  Physical Exam Triage Vital Signs ED Triage Vitals  Enc Vitals Group     BP 05/16/23 1548 131/83     Pulse Rate 05/16/23 1548 83     Resp 05/16/23 1548 20     Temp 05/16/23 1548 98.1 F (36.7 C)     Temp Source 05/16/23 1548 Oral     SpO2 05/16/23 1548 93 %     Weight --      Height --      Head Circumference --      Peak Flow --      Pain Score 05/16/23 1552 7     Pain Loc --      Pain Edu? --      Excl. in [place]? --    No data found.  Updated Vital Signs BP 131/83 (BP Location: Left Arm)   Pulse 83   Temp 98.1 F (36.7 C) (Oral)   Resp 20   LMP 04/21/2023 (Exact Date)   SpO2 95%   Visual Acuity Right Eye Distance:   Left Eye Distance:   Bilateral  Distance:    Right Eye Near:   Left Eye Near:    Bilateral Near:     Physical Exam Constitutional:      General: She is not in acute distress.    Appearance: Normal appearance. She is not toxic-appearing or diaphoretic.  HENT:     Head: Normocephalic and atraumatic.     Mouth/Throat:     Comments: Patient has several broken teeth on the right lower back dentition with mild surrounding erythema and swelling.  No obvious visible abscess noted. Eyes:     Extraocular Movements: Extraocular movements intact.     Conjunctiva/sclera: Conjunctivae normal.  Pulmonary:     Effort: Pulmonary effort is normal.  Neurological:     General: No focal deficit present.     Mental Status: She is alert and oriented to person, place, and time. Mental status is at baseline.  Psychiatric:        Mood and Affect: Mood normal.        Behavior: Behavior normal.        Thought Content: Thought content normal.        Judgment: Judgment normal.      UC Treatments / Results  Labs (all labs ordered are listed, but only abnormal results are displayed) Labs Reviewed - No data to display  EKG   Radiology No results found.  Procedures Procedures (including critical care time)  Medications Ordered in UC Medications - No data to display  Initial Impression / Assessment and Plan / UC Course  I have reviewed the triage vital signs and the nursing notes.  Pertinent labs & imaging results that were available during my care of the patient were reviewed by me and considered in my medical decision making (see chart for details).     Physical exam is concerning for dental infection.  Will treat with Augmentin antibiotic.  Advised strict follow-up with dentist and supportive care.  Patient's oxygen saturation appears baseline over the past few visits.  Patient reports they have a history of asthma and are following up with asthma specialist soon.  No shortness of breath or tachypnea so do not think that  emergent evaluation is necessary.  Patient verbalized understanding and was agreeable with plan. Final Clinical Impressions(s) / UC Diagnoses   Final diagnoses:  Dental infection  Pain, dental     Discharge Instructions      I have prescribed you an antibiotic for dental infection.  Follow-up if any symptoms persist or worsen.    ED Prescriptions     Medication Sig Dispense Auth. Provider   amoxicillin-clavulanate (AUGMENTIN) 875-125 MG tablet Take 1 tablet by mouth every 12 (twelve) hours. 14 tablet [place], Acie Fredrickson, [place]      PDMP not reviewed this encounter.   Gustavus Bryant, [place] 05/16/23 [phone]

## 2023-05-16 NOTE — ED Triage Notes (Signed)
Pt reports dental paint and headache x 1 week. Tylenol and hot tea gives some relief.

## 2023-05-16 NOTE — Discharge Instructions (Signed)
I have prescribed you an antibiotic for dental infection.  Follow-up if any symptoms persist or worsen.

## 2023-05-29 ENCOUNTER — Encounter (HOSPITAL_COMMUNITY): Payer: Self-pay | Admitting: Emergency Medicine

## 2023-05-29 ENCOUNTER — Emergency Department (HOSPITAL_COMMUNITY)
Admission: EM | Admit: 2023-05-29 | Discharge: 2023-05-29 | Disposition: A | Discharge status: Home / Self Care | Payer: No Typology Code available for payment source | Attending: Emergency Medicine | Admitting: Emergency Medicine

## 2023-05-29 DIAGNOSIS — M5432 Sciatica, left side: Secondary | ICD-10-CM

## 2023-05-29 DIAGNOSIS — M79604 Pain in right leg: Secondary | ICD-10-CM | POA: Diagnosis present

## 2023-05-29 DIAGNOSIS — M5431 Sciatica, right side: Secondary | ICD-10-CM | POA: Insufficient documentation

## 2023-05-29 MED ORDER — NAPROXEN 500 MG PO TABS: 500.0000 mg | ORAL_TABLET | Freq: Two times a day (BID) | ORAL | 0 refills | Status: DC

## 2023-05-29 MED ORDER — DEXAMETHASONE SODIUM PHOSPHATE 10 MG/ML IJ SOLN
10.0000 mg | Freq: Once | INTRAMUSCULAR | Status: AC
  Administered 2023-05-29: 10 mg via INTRAMUSCULAR
  Filled 2023-05-29: qty 1

## 2023-05-29 NOTE — ED Provider Notes (Signed)
[place] EMERGENCY DEPARTMENT AT [facility] Provider Note   CSN: [id number] Arrival date & time: 05/29/23  1900     History  Chief Complaint  Patient presents with   Leg Pain    Jasmine Heath is a 52 y.o. female.  Patient is a 52 year old female who presents with pain that she thinks is sciatica.  She says it starts in her buttocks area and radiates down mostly her right leg but sometimes in her left leg.  She says it goes down into her feet at times.  Intermittently she will have some tingling in her legs but denies any ongoing numbness.  No weakness.  No loss of bowel or bladder function.  She says has been going on for about the last month.  She has been seen for sciatica in the past and seem to improve with steroid injections.  She does not have any recent history of injuries.  She does not have any other known medical history other than obesity and she says she is borderline diabetic.       Home Medications Prior to Admission medications   Medication Sig Start Date End Date Taking? Authorizing Provider  naproxen (NAPROSYN) 500 MG tablet Take 1 tablet (500 mg total) by mouth 2 (two) times daily with a meal. 05/29/23  Yes Rolan Bucco, MD  acetaminophen (TYLENOL) 500 MG tablet Take 500 mg by mouth every 6 (six) hours as needed.    [provider]  albuterol (PROVENTIL) (2.5 MG/3ML) 0.083% nebulizer solution Take 3 mLs (2.5 mg total) by nebulization every 6 (six) hours as needed for wheezing or shortness of breath. 10/29/22   Gustavus Bryant, FNP  albuterol (VENTOLIN HFA) 108 (90 Base) MCG/ACT inhaler Inhale 1-2 puffs into the lungs every 6 (six) hours as needed for wheezing or shortness of breath. 05/30/20   Wallis Bamberg, PA-C  albuterol (VENTOLIN HFA) 108 (90 Base) MCG/ACT inhaler Inhale 1-2 puffs into the lungs every 6 (six) hours as needed for wheezing or shortness of breath. 04/10/21   Placido Sou, PA-C  amoxicillin-clavulanate (AUGMENTIN) 875-125 MG  tablet Take 1 tablet by mouth every 12 (twelve) hours. 05/16/23   Gustavus Bryant, FNP  benzonatate (TESSALON) 100 MG capsule Take 1 capsule (100 mg total) by mouth every 8 (eight) hours as needed for cough. 10/29/22   Gustavus Bryant, FNP  HYDROcodone-acetaminophen (NORCO) 5-325 MG tablet Take 1 tablet by mouth every 6 (six) hours as needed for severe pain. 06/15/22   Molpus, John, MD  ibuprofen (ADVIL) 600 MG tablet Take 1 tablet (600 mg total) by mouth every 6 (six) hours as needed. 11/19/22   Tomi Bamberger, PA-C  omeprazole (PRILOSEC) 20 MG capsule Take 1 capsule (20 mg total) by mouth daily. 09/01/22   Tomi Bamberger, PA-C      Allergies    Patient has no known allergies.    Review of Systems   Review of Systems  Constitutional:  Negative for fever.  Gastrointestinal:  Negative for nausea and vomiting.  Musculoskeletal:  Positive for back pain. Negative for arthralgias, joint swelling and neck pain.  Skin:  Negative for wound.  Neurological:  Positive for numbness. Negative for weakness and headaches.    Physical Exam Updated Vital Signs BP 138/70   Pulse 87   Temp 98.1 F (36.7 C)   Resp 16   LMP 04/21/2023 (Approximate)   SpO2 100%  Physical Exam Constitutional:      Appearance: She is well-developed. She  is obese.  HENT:     Head: Normocephalic and atraumatic.  Cardiovascular:     Rate and Rhythm: Normal rate.  Pulmonary:     Effort: Pulmonary effort is normal.  Musculoskeletal:        General: Tenderness present.     Cervical back: Normal range of motion and neck supple.     Comments: Positive tenderness over the right sciatic nerve.  Negative straight leg raise bilaterally.  No spinal tenderness is noted.  She has normal sensation and motor function distally.  Pedal pulses are intact.  Skin:    General: Skin is warm and dry.  Neurological:     Mental Status: She is alert and oriented to person, place, and time.     ED Results / Procedures / Treatments    Labs (all labs ordered are listed, but only abnormal results are displayed) Labs Reviewed - No data to display  EKG None  Radiology No results found.  Procedures Procedures    Medications Ordered in ED Medications  dexamethasone (DECADRON) injection 10 mg (10 mg Intramuscular Given 05/29/23 2000)    ED Course/ Medical Decision Making/ A&P                             Medical Decision Making Risk Prescription drug management.   Patient is a 52 year old who presents with pain going down both of her legs but mostly the right leg.  Her symptoms seem consistent with sciatica.  She has no associate abdominal pain.  No neurologic dysfunction or signs of cauda equina.  No recent injuries which would warrant emergent imaging.  Will go ahead and give her shot of Decadron.  Will discharge her with a prescription for Naprosyn.  I did advise her to check her blood sugars at home.  She is planning on reestablishing care with her primary care doctor.  She was also given a referral to follow-up with orthopedics if her symptoms are not improving.  Return precautions were given.  Final Clinical Impression(s) / ED Diagnoses Final diagnoses:  Bilateral sciatica    Rx / DC Orders ED Discharge Orders          Ordered    naproxen (NAPROSYN) 500 MG tablet  2 times daily with meals        05/29/23 Scharlene Gloss, MD 05/29/23 2008

## 2023-05-29 NOTE — ED Triage Notes (Signed)
Left sided lower back pain radiating down buttocks. Has hx of this in past. Denies loss of bowel or bladder function. Pain been going on for month worsening. Takes OTC pain meds.

## 2023-05-29 NOTE — Discharge Instructions (Addendum)
Check your blood sugars at home.  Make an appointment to reestablish care with your primary care doctor.  You can follow-up with the orthopedist listed above if your sciatica does not improve.  Return to the emergency room if you have any worsening symptoms.

## 2023-06-24 ENCOUNTER — Other Ambulatory Visit: Payer: Self-pay

## 2023-06-24 ENCOUNTER — Emergency Department (HOSPITAL_COMMUNITY): Payer: No Typology Code available for payment source

## 2023-06-24 ENCOUNTER — Emergency Department (HOSPITAL_COMMUNITY)
Admission: EM | Admit: 2023-06-24 | Discharge: 2023-06-24 | Disposition: A | Discharge status: Home / Self Care | Payer: No Typology Code available for payment source | Attending: Emergency Medicine | Admitting: Emergency Medicine

## 2023-06-24 DIAGNOSIS — M25561 Pain in right knee: Secondary | ICD-10-CM | POA: Diagnosis present

## 2023-06-24 DIAGNOSIS — X501XXA Overexertion from prolonged static or awkward postures, initial encounter: Secondary | ICD-10-CM | POA: Diagnosis not present

## 2023-06-24 DIAGNOSIS — Y9301 Activity, walking, marching and hiking: Secondary | ICD-10-CM | POA: Insufficient documentation

## 2023-06-24 MED ORDER — OXYCODONE HCL 5 MG PO TABS: 5.0000 mg | ORAL_TABLET | Freq: Four times a day (QID) | ORAL | 0 refills | Status: DC | PRN

## 2023-06-24 NOTE — ED Triage Notes (Signed)
Pt reports worsening rt knee pain after feeling a popping sensation while trying to put her sock on this morning. Pt endorses recent twisting injury to rt knee. Hx of believed ACL tear.

## 2023-06-24 NOTE — ED Provider Notes (Signed)
[place] EMERGENCY DEPARTMENT AT [facility] Provider Note   CSN: [id number] Arrival date & time: 06/24/23  1033     History  Chief Complaint  Patient presents with   rt knee pain    Jasmine Heath is a 52 y.o. female.  HPI 52 year old female presenting for right knee pain.  She states about a week ago she was walking and twisted her knee.  She did not fall or hit her head.  She is not walking on it since then pain has persisted.  She feels a popping sensation when she moves it.  No weakness or numbness.  No fever or skin changes.     Home Medications Prior to Admission medications   Medication Sig Start Date End Date Taking? Authorizing Provider  oxyCODONE (ROXICODONE) 5 MG immediate release tablet Take 1 tablet (5 mg total) by mouth every 6 (six) hours as needed for severe pain. 06/24/23  Yes Laurence Spates, MD  acetaminophen (TYLENOL) 500 MG tablet Take 500 mg by mouth every 6 (six) hours as needed.    [provider]  albuterol (PROVENTIL) (2.5 MG/3ML) 0.083% nebulizer solution Take 3 mLs (2.5 mg total) by nebulization every 6 (six) hours as needed for wheezing or shortness of breath. 10/29/22   Gustavus Bryant, FNP  albuterol (VENTOLIN HFA) 108 (90 Base) MCG/ACT inhaler Inhale 1-2 puffs into the lungs every 6 (six) hours as needed for wheezing or shortness of breath. 05/30/20   Wallis Bamberg, PA-C  albuterol (VENTOLIN HFA) 108 (90 Base) MCG/ACT inhaler Inhale 1-2 puffs into the lungs every 6 (six) hours as needed for wheezing or shortness of breath. 04/10/21   Placido Sou, PA-C  amoxicillin-clavulanate (AUGMENTIN) 875-125 MG tablet Take 1 tablet by mouth every 12 (twelve) hours. 05/16/23   Gustavus Bryant, FNP  benzonatate (TESSALON) 100 MG capsule Take 1 capsule (100 mg total) by mouth every 8 (eight) hours as needed for cough. 10/29/22   Gustavus Bryant, FNP  ibuprofen (ADVIL) 600 MG tablet Take 1 tablet (600 mg total) by mouth every 6 (six) hours as  needed. 11/19/22   Tomi Bamberger, PA-C  naproxen (NAPROSYN) 500 MG tablet Take 1 tablet (500 mg total) by mouth 2 (two) times daily with a meal. 05/29/23   Rolan Bucco, MD  omeprazole (PRILOSEC) 20 MG capsule Take 1 capsule (20 mg total) by mouth daily. 09/01/22   Tomi Bamberger, PA-C      Allergies    Patient has no known allergies.    Review of Systems   Review of Systems Review of systems completed and notable as per HPI.  ROS otherwise negative.   Physical Exam Updated Vital Signs LMP 04/21/2023 (Approximate)  Physical Exam Vitals and nursing note reviewed.  Constitutional:      General: She is not in acute distress.    Appearance: She is well-developed.  HENT:     Head: Normocephalic and atraumatic.  Eyes:     Conjunctiva/sclera: Conjunctivae normal.  Cardiovascular:     Rate and Rhythm: Normal rate and regular rhythm.     Heart sounds: No murmur heard. Pulmonary:     Effort: Pulmonary effort is normal. No respiratory distress.     Breath sounds: Normal breath sounds.  Abdominal:     Palpations: Abdomen is soft.  Musculoskeletal:        General: No swelling.     Cervical back: Neck supple.     Comments: Mild tenderness to lateral knee.  No swelling or erythema.  Pain with range of motion.  Good strength at the hip, knee, ankle.  2+ DP and PT pulse.  No joint laxity.  Skin:    General: Skin is warm and dry.     Capillary Refill: Capillary refill takes less than 2 seconds.  Neurological:     Mental Status: She is alert.  Psychiatric:        Mood and Affect: Mood normal.     ED Results / Procedures / Treatments   Labs (all labs ordered are listed, but only abnormal results are displayed) Labs Reviewed - No data to display  EKG None  Radiology DG Knee Complete 4 Views Right  Result Date: 06/24/2023 CLINICAL DATA:  Twisted knee.  Pain with range of motion. EXAM: RIGHT KNEE - COMPLETE 4+ VIEW COMPARISON:  None Available. FINDINGS: Moderate to severe  patellofemoral joint space narrowing and peripheral osteophytosis. 8 mm well corticated chronic ossicle superior to the patella on lateral view. No joint effusion. Mild chronic enthesopathic change at the quadriceps and patellar tendon insertions on the patella. Minimal medial compartment joint space narrowing. Mild peripheral medial and lateral compartment degenerative osteophytes. 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 Electronically Signed   By: Neita Garnet M.D.   On: 06/24/2023 14:07    Procedures Procedures    Medications Ordered in ED Medications - No data to display  ED Course/ Medical Decision Making/ A&P                             Medical Decision Making Amount and/or Complexity of Data Reviewed Radiology: ordered.  Risk Prescription drug management.   Medical Decision Making:   Jasmine Heath is a 52 y.o. female who presented to the ED today with right knee pain after twisting it.  Her vital signs in the room on the monitor which were unremarkable.  She reports mechanical pain to her right knee after twisting it a week ago.  No other trauma.  She is neurovascularly intact.  She has no fever or signs of septic arthritis or skin or soft tissue infection.  X-ray reviewed, no signs of fracture.  She has no joint laxity, however catching sensation is concerning for possible ligamentous or meniscal injury placed in crutches with knee immobilizer and recommend close follow-up and nonweightbearing until follow-up with orthopedics.  She is comfortable this plan.  Discharged in stable condition.  Patient's presentation is most consistent with acute complicated illness / injury requiring diagnostic workup.           Final Clinical Impression(s) / ED Diagnoses Final diagnoses:  Acute pain of right knee    Rx / DC Orders ED Discharge Orders          Ordered    oxyCODONE (ROXICODONE) 5 MG immediate release tablet  Every 6  hours PRN        06/24/23 1501              Laurence Spates, MD 06/24/23 [phone]

## 2023-06-24 NOTE — Discharge Instructions (Signed)
Your x-ray did not show any fracture, however you will likely need repeat imaging to evaluate the ligaments and cartilage of your knee.  You should wear the knee immobilizer and crutches until you follow-up with orthopedics.  You can take the prescribed pain medication but should not drive while taking it.  If you develop severe pain or any other new concerning symptoms you should return to the ED.

## 2023-06-24 NOTE — Progress Notes (Signed)
Orthopedic Tech Progress Note Patient Details:  Jasmine Heath 21-May-1971 [id number]  Ortho Devices Type of Ortho Device: Knee Immobilizer, Crutches Ortho Device/Splint Location: right knee immobilizer. crutches sized and instructed on use Ortho Device/Splint Interventions: Ordered, Application, Adjustment   Post Interventions Patient Tolerated: Well Instructions Provided: Adjustment of device, Care of device  [name] 06/24/2023, 2:35 PM

## 2023-07-02 ENCOUNTER — Emergency Department (HOSPITAL_COMMUNITY)
Admission: EM | Admit: 2023-07-02 | Discharge: 2023-07-02 | Disposition: A | Discharge status: Home / Self Care | Payer: PRIVATE HEALTH INSURANCE | Attending: Emergency Medicine | Admitting: Emergency Medicine

## 2023-07-02 ENCOUNTER — Encounter (HOSPITAL_COMMUNITY): Payer: Self-pay

## 2023-07-02 ENCOUNTER — Other Ambulatory Visit: Payer: Self-pay

## 2023-07-02 DIAGNOSIS — M5431 Sciatica, right side: Secondary | ICD-10-CM

## 2023-07-02 DIAGNOSIS — R220 Localized swelling, mass and lump, head: Secondary | ICD-10-CM | POA: Diagnosis present

## 2023-07-02 DIAGNOSIS — L03213 Periorbital cellulitis: Secondary | ICD-10-CM

## 2023-07-02 DIAGNOSIS — H00011 Hordeolum externum right upper eyelid: Secondary | ICD-10-CM | POA: Diagnosis not present

## 2023-07-02 MED ORDER — AMOXICILLIN-POT CLAVULANATE 875-125 MG PO TABS: 1.0000 | ORAL_TABLET | Freq: Two times a day (BID) | ORAL | 0 refills | Status: DC

## 2023-07-02 MED ORDER — KETOROLAC TROMETHAMINE 30 MG/ML IJ SOLN
30.0000 mg | Freq: Once | INTRAMUSCULAR | Status: AC
  Administered 2023-07-02: 30 mg via INTRAMUSCULAR
  Filled 2023-07-02: qty 1

## 2023-07-02 MED ORDER — OXYCODONE-ACETAMINOPHEN 5-325 MG PO TABS
2.0000 | ORAL_TABLET | Freq: Once | ORAL | Status: DC
  Filled 2023-07-02: qty 2

## 2023-07-02 MED ORDER — LIDOCAINE 5 % EX PTCH: 1.0000 | MEDICATED_PATCH | CUTANEOUS | 0 refills | Status: DC

## 2023-07-02 MED ORDER — TRAMADOL HCL 50 MG PO TABS: 50.0000 mg | ORAL_TABLET | Freq: Four times a day (QID) | ORAL | 0 refills | Status: DC | PRN

## 2023-07-02 MED ORDER — DIAZEPAM 2 MG PO TABS: 2.0000 mg | ORAL_TABLET | ORAL | 0 refills | Status: DC | PRN

## 2023-07-02 NOTE — ED Provider Notes (Signed)
[place] EMERGENCY DEPARTMENT AT [facility] Provider Note   CSN: [id number] Arrival date & time: 07/02/23  1805     History  Chief Complaint  Patient presents with   Facial Swelling    Jasmine Heath is a 52 y.o. female.  52 year old female presents with 2 complaints.  First is swelling to her right upper eyelid.  She has a stye there is states that it was like it is infected.  Has had some drainage from the eye.  Denies any fever or chills.  No visual changes.  Second complaint is worsening chronic low back pain.  Worse with movement.  Has been intermittent.  No bowel or bladder dysfunction.  No medications used prior to arrival      Home Medications Prior to Admission medications   Medication Sig Start Date End Date Taking? Authorizing Provider  acetaminophen (TYLENOL) 500 MG tablet Take 500 mg by mouth every 6 (six) hours as needed.    [provider]  albuterol (PROVENTIL) (2.5 MG/3ML) 0.083% nebulizer solution Take 3 mLs (2.5 mg total) by nebulization every 6 (six) hours as needed for wheezing or shortness of breath. 10/29/22   Gustavus Bryant, FNP  albuterol (VENTOLIN HFA) 108 (90 Base) MCG/ACT inhaler Inhale 1-2 puffs into the lungs every 6 (six) hours as needed for wheezing or shortness of breath. 05/30/20   Wallis Bamberg, PA-C  albuterol (VENTOLIN HFA) 108 (90 Base) MCG/ACT inhaler Inhale 1-2 puffs into the lungs every 6 (six) hours as needed for wheezing or shortness of breath. 04/10/21   Placido Sou, PA-C  amoxicillin-clavulanate (AUGMENTIN) 875-125 MG tablet Take 1 tablet by mouth every 12 (twelve) hours. 05/16/23   Gustavus Bryant, FNP  benzonatate (TESSALON) 100 MG capsule Take 1 capsule (100 mg total) by mouth every 8 (eight) hours as needed for cough. 10/29/22   Gustavus Bryant, FNP  ibuprofen (ADVIL) 600 MG tablet Take 1 tablet (600 mg total) by mouth every 6 (six) hours as needed. 11/19/22   Tomi Bamberger, PA-C  naproxen (NAPROSYN) 500 MG  tablet Take 1 tablet (500 mg total) by mouth 2 (two) times daily with a meal. 05/29/23   Rolan Bucco, MD  omeprazole (PRILOSEC) 20 MG capsule Take 1 capsule (20 mg total) by mouth daily. 09/01/22   Tomi Bamberger, PA-C  oxyCODONE (ROXICODONE) 5 MG immediate release tablet Take 1 tablet (5 mg total) by mouth every 6 (six) hours as needed for severe pain. 06/24/23   Laurence Spates, MD      Allergies    Patient has no known allergies.    Review of Systems   Review of Systems  All other systems reviewed and are negative.   Physical Exam Updated Vital Signs BP 127/79 (BP Location: Left Arm)   Pulse (!) 105   Temp 98 F (36.7 C) (Oral)   Resp 18   Ht 1.676 m (5\' 6" )   Wt 122.5 kg   SpO2 95%   BMI 43.58 kg/m  Physical Exam Vitals and nursing note reviewed.  Constitutional:      General: She is not in acute distress.    Appearance: Normal appearance. She is well-developed. She is not toxic-appearing.  HENT:     Head: Normocephalic and atraumatic.  Eyes:     General:        Right eye: Discharge present.     Conjunctiva/sclera: Conjunctivae normal.     Pupils: Pupils are equal, round, and reactive to light.  Neck:     Thyroid: No thyroid mass.     Trachea: No tracheal deviation.  Cardiovascular:     Rate and Rhythm: Normal rate and regular rhythm.     Heart sounds: Normal heart sounds. No murmur heard.    No gallop.  Pulmonary:     Effort: Pulmonary effort is normal. No respiratory distress.     Breath sounds: Normal breath sounds. No stridor. No decreased breath sounds, wheezing, rhonchi or rales.  Abdominal:     General: There is no distension.     Palpations: Abdomen is soft.     Tenderness: There is no abdominal tenderness. There is no rebound.  Musculoskeletal:        General: No tenderness. Normal range of motion.     Cervical back: Normal range of motion and neck supple.       Back:  Skin:    General: Skin is warm and dry.     Findings: No abrasion or  rash.  Neurological:     Mental Status: She is alert and oriented to person, place, and time. Mental status is at baseline.     GCS: GCS eye subscore is 4. GCS verbal subscore is 5. GCS motor subscore is 6.     Cranial Nerves: Cranial nerves are intact. No cranial nerve deficit.     Sensory: No sensory deficit.     Motor: Motor function is intact.     Comments: Strength is 5 of 5 in lower extremities.  Psychiatric:        Attention and Perception: Attention normal.        Speech: Speech normal.        Behavior: Behavior normal.     ED Results / Procedures / Treatments   Labs (all labs ordered are listed, but only abnormal results are displayed) Labs Reviewed - No data to display  EKG None  Radiology No results found.  Procedures Procedures    Medications Ordered in ED Medications - No data to display  ED Course/ Medical Decision Making/ A&P                                 Medical Decision Making Patient with right eye swelling consistent with a stye.  No concern for periorbital cellulitis possible preseptal cellulitis.  Will place on antibiotics.  Has chronic low back pain consistent with sciatica.  Will treat with medications for that.  Amount and/or Complexity of Data Reviewed Independent Historian: friend           Final Clinical Impression(s) / ED Diagnoses Final diagnoses:  None    Rx / DC Orders ED Discharge Orders     None         Lorre Nick, MD 07/02/23 2007

## 2023-07-02 NOTE — ED Triage Notes (Signed)
Patient reports right eye/facial swelling x 4 days. Denies injury to eye. Also reports sciatic nerve pain x 5 days. Patient has not taken anything for the pain.

## 2023-08-28 ENCOUNTER — Ambulatory Visit
Admission: EM | Admit: 2023-08-28 | Discharge: 2023-08-28 | Disposition: A | Discharge status: Home / Self Care | Payer: No Typology Code available for payment source | Attending: Family Medicine | Admitting: Family Medicine

## 2023-08-28 ENCOUNTER — Other Ambulatory Visit: Payer: Self-pay

## 2023-08-28 ENCOUNTER — Encounter: Payer: Self-pay | Admitting: Emergency Medicine

## 2023-08-28 DIAGNOSIS — K047 Periapical abscess without sinus: Secondary | ICD-10-CM | POA: Diagnosis not present

## 2023-08-28 MED ORDER — KETOROLAC TROMETHAMINE 30 MG/ML IJ SOLN
30.0000 mg | Freq: Once | INTRAMUSCULAR | Status: AC
  Administered 2023-08-28: 30 mg via INTRAMUSCULAR

## 2023-08-28 MED ORDER — KETOROLAC TROMETHAMINE 10 MG PO TABS: 10.0000 mg | ORAL_TABLET | Freq: Four times a day (QID) | ORAL | 0 refills | Status: DC | PRN

## 2023-08-28 MED ORDER — AMOXICILLIN-POT CLAVULANATE 875-125 MG PO TABS: 1.0000 | ORAL_TABLET | Freq: Two times a day (BID) | ORAL | 0 refills | Status: AC

## 2023-08-28 NOTE — Discharge Instructions (Signed)
You have been given a shot of Toradol 30 mg today.  Ketorolac 10 mg tablets--take 1 tablet every 6 hours as needed for pain.  This is the same medicine that is in the shot we just gave you  Take amoxicillin-clavulanate 875 mg--1 tab twice daily with food for 7 days; this is your antibiotic.

## 2023-08-28 NOTE — ED Triage Notes (Signed)
Pt here for bilateral side dental pain x 10 days

## 2023-08-28 NOTE — ED Provider Notes (Signed)
[facility]    CSN: [id number] Arrival date & time: 08/28/23  1301      History   Chief Complaint Chief Complaint  Patient presents with   Dental Pain    HPI Jasmine Heath is a 52 y.o. female.    Dental Pain Here for pain and swelling on both sides of her lower jaw.  Has been going on for about 10 days.  The pain is actually improved a little bit in the last few days, as she has noted the spots where her teeth are broken have drained some.  No fever or chills.  She is not allergic to any medication  Past Medical History:  Diagnosis Date   Asthma     Patient Active Problem List   Diagnosis Date Noted   Acute respiratory failure due to COVID-19 (HCC) 11/29/2020   Diabetes mellitus (HCC) 11/17/2012   Anemia 11/15/2012   Pain due to dental caries 11/15/2012   Status asthmaticus 11/14/2012   SOB (shortness of breath) 11/14/2012   Morbid obesity (HCC) 11/14/2012   Hypokalemia 11/14/2012    Past Surgical History:  Procedure Laterality Date   FOOT SURGERY     I+D    OB History   No obstetric history on file.      Home Medications    Prior to Admission medications   Medication Sig Start Date End Date Taking? Authorizing Provider  amoxicillin-clavulanate (AUGMENTIN) 875-125 MG tablet Take 1 tablet by mouth 2 (two) times daily for 7 days. 08/28/23 09/04/23 Yes Darline Faith, Janace Aris, MD  ketorolac (TORADOL) 10 MG tablet Take 1 tablet (10 mg total) by mouth every 6 (six) hours as needed (pain). 08/28/23  Yes Zenia Resides, MD  acetaminophen (TYLENOL) 500 MG tablet Take 500 mg by mouth every 6 (six) hours as needed.    [provider]  albuterol (PROVENTIL) (2.5 MG/3ML) 0.083% nebulizer solution Take 3 mLs (2.5 mg total) by nebulization every 6 (six) hours as needed for wheezing or shortness of breath. 10/29/22   Gustavus Bryant, FNP  albuterol (VENTOLIN HFA) 108 (90 Base) MCG/ACT inhaler Inhale 1-2 puffs into the lungs every 6 (six) hours as  needed for wheezing or shortness of breath. 05/30/20   Wallis Bamberg, PA-C  albuterol (VENTOLIN HFA) 108 (90 Base) MCG/ACT inhaler Inhale 1-2 puffs into the lungs every 6 (six) hours as needed for wheezing or shortness of breath. 04/10/21   Placido Sou, PA-C  benzonatate (TESSALON) 100 MG capsule Take 1 capsule (100 mg total) by mouth every 8 (eight) hours as needed for cough. 10/29/22   Gustavus Bryant, FNP  diazepam (VALIUM) 2 MG tablet Take 1 tablet (2 mg total) by mouth every 4 (four) hours as needed for muscle spasms. 07/02/23   Lorre Nick, MD  ibuprofen (ADVIL) 600 MG tablet Take 1 tablet (600 mg total) by mouth every 6 (six) hours as needed. 11/19/22   Tomi Bamberger, PA-C  lidocaine (LIDODERM) 5 % Place 1 patch onto the skin daily. Remove & Discard patch within 12 hours or as directed by MD 07/02/23   Lorre Nick, MD  naproxen (NAPROSYN) 500 MG tablet Take 1 tablet (500 mg total) by mouth 2 (two) times daily with a meal. 05/29/23   Rolan Bucco, MD  omeprazole (PRILOSEC) 20 MG capsule Take 1 capsule (20 mg total) by mouth daily. 09/01/22   Tomi Bamberger, PA-C  oxyCODONE (ROXICODONE) 5 MG immediate release tablet Take 1 tablet (5 mg total) by mouth  every 6 (six) hours as needed for severe pain. Patient not taking: Reported on 08/28/2023 06/24/23   Laurence Spates, MD  traMADol (ULTRAM) 50 MG tablet Take 1 tablet (50 mg total) by mouth every 6 (six) hours as needed. 07/02/23   Lorre Nick, MD    Family History Family History  Problem Relation Age of Onset   Asthma Mother     Social History Social History   Tobacco Use   Smoking status: Every Day    Current packs/day: 0.25    Types: Cigarettes   Smokeless tobacco: Never  Vaping Use   Vaping status: Never Used  Substance Use Topics   Alcohol use: Yes    Comment: Reports she drinks on holidays    Drug use: No     Allergies   Patient has no known allergies.   Review of Systems Review of Systems   Physical  Exam Triage Vital Signs ED Triage Vitals [08/28/23 1411]  Encounter Vitals Group     BP 134/74     Systolic BP Percentile      Diastolic BP Percentile      Pulse Rate 88     Resp 18     Temp 98 F (36.7 C)     Temp Source Oral     SpO2 97 %     Weight      Height      Head Circumference      Peak Flow      Pain Score 8     Pain Loc      Pain Education      Exclude from Growth Chart    No data found.  Updated Vital Signs BP 134/74 (BP Location: Left Arm)   Pulse 88   Temp 98 F (36.7 C) (Oral)   Resp 18   SpO2 97%   Visual Acuity Right Eye Distance:   Left Eye Distance:   Bilateral Distance:    Right Eye Near:   Left Eye Near:    Bilateral Near:     Physical Exam Vitals reviewed.  Constitutional:      General: She is not in acute distress.    Appearance: She is not ill-appearing, toxic-appearing or diaphoretic.  HENT:     Nose: Nose normal.     Mouth/Throat:     Mouth: Mucous membranes are moist.     Comments: There are some broken teeth along her lower jaw on both sides and anterior dental ridge; no swelling and no drainage at this moment.  Cardiovascular:     Rate and Rhythm: Normal rate and regular rhythm.  Pulmonary:     Effort: Pulmonary effort is normal.     Breath sounds: Normal breath sounds.  Skin:    Coloration: Skin is not jaundiced or pale.  Neurological:     General: No focal deficit present.     Mental Status: She is alert and oriented to person, place, and time.  Psychiatric:        Behavior: Behavior normal.      UC Treatments / Results  Labs (all labs ordered are listed, but only abnormal results are displayed) Labs Reviewed - No data to display  EKG   Radiology No results found.  Procedures Procedures (including critical care time)  Medications Ordered in UC Medications  ketorolac (TORADOL) 30 MG/ML injection 30 mg (30 mg Intramuscular Given 08/28/23 1424)    Initial Impression / Assessment and Plan / UC Course  I  have  reviewed the triage vital signs and the nursing notes.  Pertinent labs & imaging results that were available during my care of the patient were reviewed by me and considered in my medical decision making (see chart for details).        Augmentin is sent in for the dental infection and Toradol injections given here.  Toradol tablets are sent in for pain to the pharmacy.  She does have an appointment to see her dentist in [place]. Final Clinical Impressions(s) / UC Diagnoses   Final diagnoses:  Dental infection     Discharge Instructions      You have been given a shot of Toradol 30 mg today.  Ketorolac 10 mg tablets--take 1 tablet every 6 hours as needed for pain.  This is the same medicine that is in the shot we just gave you  Take amoxicillin-clavulanate 875 mg--1 tab twice daily with food for 7 days; this is your antibiotic.     ED Prescriptions     Medication Sig Dispense Auth. Provider   amoxicillin-clavulanate (AUGMENTIN) 875-125 MG tablet Take 1 tablet by mouth 2 (two) times daily for 7 days. 14 tablet Breindel Collier, Janace Aris, MD   ketorolac (TORADOL) 10 MG tablet Take 1 tablet (10 mg total) by mouth every 6 (six) hours as needed (pain). 20 tablet Cheray Pardi, Janace Aris, MD      I have reviewed the PDMP during this encounter.   Zenia Resides, MD 08/28/23 [phone]

## 2023-09-04 ENCOUNTER — Telehealth: Payer: Self-pay | Admitting: *Deleted

## 2023-09-04 NOTE — Telephone Encounter (Signed)
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 Dr. Blanca Friend note and pt med list reviewed but Mobic not listed in chart. He states he will counsel patient not takes any other NSAIDs while taking the ketoralac.

## 2023-10-18 ENCOUNTER — Ambulatory Visit
Admission: EM | Admit: 2023-10-18 | Discharge: 2023-10-18 | Disposition: A | Discharge status: Home / Self Care | Payer: 59 | Attending: Internal Medicine | Admitting: Internal Medicine

## 2023-10-18 DIAGNOSIS — M5442 Lumbago with sciatica, left side: Secondary | ICD-10-CM | POA: Diagnosis not present

## 2023-10-18 DIAGNOSIS — G8929 Other chronic pain: Secondary | ICD-10-CM

## 2023-10-18 DIAGNOSIS — M5441 Lumbago with sciatica, right side: Secondary | ICD-10-CM

## 2023-10-18 MED ORDER — KETOROLAC TROMETHAMINE 30 MG/ML IJ SOLN
30.0000 mg | Freq: Once | INTRAMUSCULAR | Status: AC
  Administered 2023-10-18: 30 mg via INTRAMUSCULAR

## 2023-10-18 NOTE — ED Provider Notes (Addendum)
[facility]    CSN: [id number] Arrival date & time: 10/18/23  1058      History   Chief Complaint Chief Complaint  Patient presents with   Back Pain    HPI Jasmine Heath is a 52 y.o. female.   Patient presents with persistent chronic back pain that has been ongoing for multiple months.  Patient has been seen multiple times for back pain in urgent care, ER, and by orthopedic surgery.  She last saw orthopedic surgery at end of October.  She has been on several courses of steroids with last 1 being at the end of October.  She reports it provides temporary improvement.  She was also prescribed meloxicam which she has not started taking.  Patient is requesting a referral to neurology.  She had a lumbar x-ray that showed degenerative disc disease.  Pain radiates down bilateral legs.  Denies urinary frequency, urinary bowel continence, saddle anesthesia.    Back Pain   Past Medical History:  Diagnosis Date   Asthma     Patient Active Problem List   Diagnosis Date Noted   Acute respiratory failure due to COVID-19 (HCC) 11/29/2020   Diabetes mellitus (HCC) 11/17/2012   Anemia 11/15/2012   Pain due to dental caries 11/15/2012   Status asthmaticus 11/14/2012   SOB (shortness of breath) 11/14/2012   Morbid obesity (HCC) 11/14/2012   Hypokalemia 11/14/2012    Past Surgical History:  Procedure Laterality Date   FOOT SURGERY     I+D    OB History   No obstetric history on file.      Home Medications    Prior to Admission medications   Medication Sig Start Date End Date Taking? Authorizing Provider  albuterol (PROVENTIL) (2.5 MG/3ML) 0.083% nebulizer solution Take 3 mLs (2.5 mg total) by nebulization every 6 (six) hours as needed for wheezing or shortness of breath. 10/29/22  Yes Alvilda Mckenna, Acie Fredrickson, FNP  albuterol (VENTOLIN HFA) 108 (90 Base) MCG/ACT inhaler Inhale 1-2 puffs into the lungs every 6 (six) hours as needed for wheezing or shortness of breath.  05/30/20  Yes Wallis Bamberg, PA-C  ketorolac (TORADOL) 10 MG tablet Take 1 tablet (10 mg total) by mouth every 6 (six) hours as needed (pain). 08/28/23  Yes Zenia Resides, MD  acetaminophen (TYLENOL) 500 MG tablet Take 500 mg by mouth every 6 (six) hours as needed.    [provider]  albuterol (VENTOLIN HFA) 108 (90 Base) MCG/ACT inhaler Inhale 1-2 puffs into the lungs every 6 (six) hours as needed for wheezing or shortness of breath. 04/10/21   Placido Sou, PA-C  benzonatate (TESSALON) 100 MG capsule Take 1 capsule (100 mg total) by mouth every 8 (eight) hours as needed for cough. 10/29/22   Gustavus Bryant, FNP  diazepam (VALIUM) 2 MG tablet Take 1 tablet (2 mg total) by mouth every 4 (four) hours as needed for muscle spasms. 07/02/23   Lorre Nick, MD  ibuprofen (ADVIL) 600 MG tablet Take 1 tablet (600 mg total) by mouth every 6 (six) hours as needed. 11/19/22   Tomi Bamberger, PA-C  lidocaine (LIDODERM) 5 % Place 1 patch onto the skin daily. Remove & Discard patch within 12 hours or as directed by MD 07/02/23   Lorre Nick, MD  naproxen (NAPROSYN) 500 MG tablet Take 1 tablet (500 mg total) by mouth 2 (two) times daily with a meal. 05/29/23   Rolan Bucco, MD  omeprazole (PRILOSEC) 20 MG capsule Take 1 capsule (  20 mg total) by mouth daily. 09/01/22   Tomi Bamberger, PA-C  oxyCODONE (ROXICODONE) 5 MG immediate release tablet Take 1 tablet (5 mg total) by mouth every 6 (six) hours as needed for severe pain. Patient not taking: Reported on 08/28/2023 06/24/23   Laurence Spates, MD  traMADol (ULTRAM) 50 MG tablet Take 1 tablet (50 mg total) by mouth every 6 (six) hours as needed. 07/02/23   Lorre Nick, MD    Family History Family History  Problem Relation Age of Onset   Asthma Mother     Social History Social History   Tobacco Use   Smoking status: Every Day    Current packs/day: 0.25    Types: Cigarettes   Smokeless tobacco: Never  Vaping Use   Vaping status:  Never Used  Substance Use Topics   Alcohol use: Yes    Comment: Reports she drinks on holidays    Drug use: No     Allergies   Patient has no known allergies.   Review of Systems Review of Systems Per HPI  Physical Exam Triage Vital Signs ED Triage Vitals  Encounter Vitals Group     BP 10/18/23 1310 (!) 148/83     Systolic BP Percentile --      Diastolic BP Percentile --      Pulse Rate 10/18/23 1310 99     Resp 10/18/23 1310 17     Temp 10/18/23 1310 98.3 F (36.8 C)     Temp Source 10/18/23 1310 Oral     SpO2 10/18/23 1310 95 %     Weight 10/18/23 1308 280 lb (127 kg)     Height 10/18/23 1308 5\' 6"  (1.676 m)     Head Circumference --      Peak Flow --      Pain Score 10/18/23 1308 10     Pain Loc --      Pain Education --      Exclude from Growth Chart --    No data found.  Updated Vital Signs BP (!) 148/83 (BP Location: Left Arm)   Pulse 99   Temp 98.3 F (36.8 C) (Oral)   Resp 17   Ht 5\' 6"  (1.676 m)   Wt 280 lb (127 kg)   LMP 09/03/2023   SpO2 95%   BMI 45.19 kg/m   Visual Acuity Right Eye Distance:   Left Eye Distance:   Bilateral Distance:    Right Eye Near:   Left Eye Near:    Bilateral Near:     Physical Exam Constitutional:      General: She is not in acute distress.    Appearance: Normal appearance. She is not toxic-appearing or diaphoretic.  HENT:     Head: Normocephalic and atraumatic.  Eyes:     Extraocular Movements: Extraocular movements intact.     Conjunctiva/sclera: Conjunctivae normal.  Pulmonary:     Effort: Pulmonary effort is normal.  Musculoskeletal:     Comments: Tenderness to palpation across lower lumbar region.  No crepitus or step-off noted.  No swelling or discoloration.  Neurological:     General: No focal deficit present.     Mental Status: She is alert and oriented to person, place, and time. Mental status is at baseline.     Deep Tendon Reflexes: Reflexes are normal and symmetric.  Psychiatric:         Mood and Affect: Mood normal.        Behavior: Behavior normal.  Thought Content: Thought content normal.        Judgment: Judgment normal.      UC Treatments / Results  Labs (all labs ordered are listed, but only abnormal results are displayed) Labs Reviewed - No data to display  EKG   Radiology No results found.  Procedures Procedures (including critical care time)  Medications Ordered in UC Medications  ketorolac (TORADOL) 30 MG/ML injection 30 mg (30 mg Intramuscular Given 10/18/23 1337)    Initial Impression / Assessment and Plan / UC Course  I have reviewed the triage vital signs and the nursing notes.  Pertinent labs & imaging results that were available during my care of the patient were reviewed by me and considered in my medical decision making (see chart for details).     Patient had previous imaging that showed disc disease.  Given no recent injury, will defer imaging.  Patient here for chronic and persistent back pain.  Patient is followed by orthopedic surgery.  Has been on several courses of prednisone so will defer this at this time.  Patient requesting to be seen by neurology so patient was provided with contact information for spinal specialty for follow-up.  Patient is requesting spinal injection but educated patient that we do not perform those here at urgent care.  Patient was agreeable to IM Toradol to help alleviate pain.  Advised no NSAIDs for at least 24 hours following injection.  Advised to follow-up with specialist.  Patient verbalized understanding and was agreeable with plan. Final Clinical Impressions(s) / UC Diagnoses   Final diagnoses:  Chronic midline low back pain with bilateral sciatica     Discharge Instructions      You were given a Toradol shot today in urgent care for pain.  Do not take any ibuprofen, Advil, Aleve for at least 24 hours following injection.  Follow-up with orthopedic specialist or spine specialty.    ED  Prescriptions   None    PDMP not reviewed this encounter.   Gustavus Bryant, [place] 10/18/23 1420    Gustavus Bryant, [place] 10/18/23 1420

## 2023-10-18 NOTE — ED Triage Notes (Signed)
Pt states that she has ongoing back pain. Pt states that she isn't getting any relief.x4-5 months

## 2023-10-18 NOTE — Discharge Instructions (Signed)
You were given a Toradol shot today in urgent care for pain.  Do not take any ibuprofen, Advil, Aleve for at least 24 hours following injection.  Follow-up with orthopedic specialist or spine specialty.

## 2023-11-04 ENCOUNTER — Emergency Department (HOSPITAL_COMMUNITY)
Admission: EM | Admit: 2023-11-04 | Discharge: 2023-11-05 | Disposition: A | Discharge status: Home / Self Care | Payer: 59 | Attending: Emergency Medicine | Admitting: Emergency Medicine

## 2023-11-04 ENCOUNTER — Other Ambulatory Visit: Payer: Self-pay

## 2023-11-04 ENCOUNTER — Encounter (HOSPITAL_COMMUNITY): Payer: Self-pay

## 2023-11-04 DIAGNOSIS — M545 Low back pain, unspecified: Secondary | ICD-10-CM | POA: Diagnosis present

## 2023-11-04 DIAGNOSIS — M5432 Sciatica, left side: Secondary | ICD-10-CM | POA: Diagnosis not present

## 2023-11-04 DIAGNOSIS — M5431 Sciatica, right side: Secondary | ICD-10-CM | POA: Diagnosis not present

## 2023-11-04 MED ORDER — GABAPENTIN 100 MG PO CAPS: 100.0000 mg | ORAL_CAPSULE | Freq: Three times a day (TID) | ORAL | 0 refills | Status: DC | PRN

## 2023-11-04 MED ORDER — OXYCODONE-ACETAMINOPHEN 5-325 MG PO TABS: 1.0000 | ORAL_TABLET | Freq: Three times a day (TID) | ORAL | 0 refills | Status: DC | PRN

## 2023-11-04 MED ORDER — METHYLPREDNISOLONE 4 MG PO TBPK: ORAL_TABLET | ORAL | 0 refills | Status: DC

## 2023-11-04 NOTE — ED Provider Notes (Signed)
[place] EMERGENCY DEPARTMENT AT [facility] Provider Note   CSN: [id number] Arrival date & time: 11/04/23  1623     History  Chief Complaint  Patient presents with   Hip Pain   Back Pain    Taunja Waddles is a 52 y.o. female presented to ED with complaint of low back pain radiating to both legs.  She reports this is a chronic issue some dealing with for several months but it is acutely worsened in the past 2 weeks.  She works on her feet all day and says it is difficult for her to do so.  She has chronic pain rating down both of her legs to the mid calf.  He says both of her legs are cramping and burning all the time.  She denies any recent falls, injuries or trauma.  She is frustrated because she was told she needs a "referral" to see an orthopedic spine specialist, and her doctor has not been able to arrange that for her.  She has been taking ibuprofen 800 mg regularly for the past 2 to 3 days which has been helping a little bit with her pain but not significantly.  She has been on steroids in the past for this as well.  HPI     Home Medications Prior to Admission medications   Medication Sig Start Date End Date Taking? Authorizing Provider  gabapentin (NEURONTIN) 100 MG capsule Take 1 capsule (100 mg total) by mouth 3 (three) times daily as needed. 11/04/23 12/04/23 Yes Nasir Bright, Kermit Balo, MD  methylPREDNISolone (MEDROL DOSEPAK) 4 MG TBPK tablet Use as directed 11/04/23  Yes Analyah Mcconnon, Kermit Balo, MD  oxyCODONE-acetaminophen (PERCOCET/ROXICET) 5-325 MG tablet Take 1 tablet by mouth every 8 (eight) hours as needed for up to 10 doses for severe pain (pain score 7-10). 11/04/23  Yes Latiya Navia, Kermit Balo, MD  acetaminophen (TYLENOL) 500 MG tablet Take 500 mg by mouth every 6 (six) hours as needed.    [provider]  albuterol (PROVENTIL) (2.5 MG/3ML) 0.083% nebulizer solution Take 3 mLs (2.5 mg total) by nebulization every 6 (six) hours as needed for wheezing or shortness of  breath. 10/29/22   Gustavus Bryant, FNP  albuterol (VENTOLIN HFA) 108 (90 Base) MCG/ACT inhaler Inhale 1-2 puffs into the lungs every 6 (six) hours as needed for wheezing or shortness of breath. 05/30/20   Wallis Bamberg, PA-C  albuterol (VENTOLIN HFA) 108 (90 Base) MCG/ACT inhaler Inhale 1-2 puffs into the lungs every 6 (six) hours as needed for wheezing or shortness of breath. 04/10/21   Placido Sou, PA-C  benzonatate (TESSALON) 100 MG capsule Take 1 capsule (100 mg total) by mouth every 8 (eight) hours as needed for cough. 10/29/22   Gustavus Bryant, FNP  diazepam (VALIUM) 2 MG tablet Take 1 tablet (2 mg total) by mouth every 4 (four) hours as needed for muscle spasms. 07/02/23   Lorre Nick, MD  ibuprofen (ADVIL) 600 MG tablet Take 1 tablet (600 mg total) by mouth every 6 (six) hours as needed. 11/19/22   Tomi Bamberger, PA-C  ketorolac (TORADOL) 10 MG tablet Take 1 tablet (10 mg total) by mouth every 6 (six) hours as needed (pain). 08/28/23   Zenia Resides, MD  lidocaine (LIDODERM) 5 % Place 1 patch onto the skin daily. Remove & Discard patch within 12 hours or as directed by MD 07/02/23   Lorre Nick, MD  naproxen (NAPROSYN) 500 MG tablet Take 1 tablet (500 mg total) by  mouth 2 (two) times daily with a meal. 05/29/23   Rolan Bucco, MD  omeprazole (PRILOSEC) 20 MG capsule Take 1 capsule (20 mg total) by mouth daily. 09/01/22   Tomi Bamberger, PA-C  oxyCODONE (ROXICODONE) 5 MG immediate release tablet Take 1 tablet (5 mg total) by mouth every 6 (six) hours as needed for severe pain. Patient not taking: Reported on 08/28/2023 06/24/23   Laurence Spates, MD  traMADol (ULTRAM) 50 MG tablet Take 1 tablet (50 mg total) by mouth every 6 (six) hours as needed. 07/02/23   Lorre Nick, MD      Allergies    Patient has no known allergies.    Review of Systems   Review of Systems  Physical Exam Updated Vital Signs BP 110/67 (BP Location: Right Arm)   Pulse 90   Temp 98.4 F (36.9 C)  (Oral)   Resp 17   Ht 5\' 6"  (1.676 m)   Wt 127 kg   LMP 09/03/2023   SpO2 97%   BMI 45.19 kg/m  Physical Exam Constitutional:      General: She is not in acute distress.    Appearance: She is obese.  HENT:     Head: Normocephalic and atraumatic.  Eyes:     Conjunctiva/sclera: Conjunctivae normal.     Pupils: Pupils are equal, round, and reactive to light.  Cardiovascular:     Rate and Rhythm: Normal rate and regular rhythm.  Pulmonary:     Effort: Pulmonary effort is normal. No respiratory distress.  Abdominal:     General: There is no distension.     Tenderness: There is no abdominal tenderness.  Skin:    General: Skin is warm and dry.  Neurological:     General: No focal deficit present.     Mental Status: She is alert and oriented to person, place, and time. Mental status is at baseline.     Cranial Nerves: No cranial nerve deficit.     Sensory: No sensory deficit.     Motor: No weakness.  Psychiatric:        Mood and Affect: Mood normal.        Behavior: Behavior normal.     ED Results / Procedures / Treatments   Labs (all labs ordered are listed, but only abnormal results are displayed) Labs Reviewed - No data to display  EKG None  Radiology No results found.  Procedures Procedures    Medications Ordered in ED Medications - No data to display  ED Course/ Medical Decision Making/ A&P                                 Medical Decision Making  Patient is presenting with acute on chronic low back pain, atraumatic.  Suspect likely related to sciatica, may be related degenerative disc disease or disc herniation.  She does not have red flags for cauda equina syndrome.  She does not require an emergent MRI.  Low suspicion for spinal fracture to warrant x-ray or CT imaging at this time.  She is neurologically and neurovascularly intact, doubt claudication issue.  We discussed an additional steroid course, continue with the NSAIDs, we can try some gabapentin as  well as a very short course of opioid for breakthrough pain medication and helping her sleep the next 2 to 3 days.  I did provide her follow-up information for neurosurgery clinic.  She is content with this plan  and okay for discharge        Final Clinical Impression(s) / ED Diagnoses Final diagnoses:  Bilateral sciatica    Rx / DC Orders ED Discharge Orders          Ordered    methylPREDNISolone (MEDROL DOSEPAK) 4 MG TBPK tablet        11/04/23 2359    oxyCODONE-acetaminophen (PERCOCET/ROXICET) 5-325 MG tablet  Every 8 hours PRN        11/04/23 2359    gabapentin (NEURONTIN) 100 MG capsule  3 times daily PRN        11/04/23 2359              Terald Sleeper, MD 11/05/23 0001

## 2023-11-04 NOTE — Discharge Instructions (Addendum)
You will need to follow-up in the spine clinic at the number above.  Please call tomorrow to schedule an appointment as a new patient.  In the meantime, continue taking ibuprofen as well as Tylenol  for your back pain.  You can take 500 mg of Tylenol every 6 hours, needed to milligrams of ibuprofen every 8 hours.  Try to take ibuprofen with food whenever possible.  For breakthrough pain you can use gabapentin, which is a nerve pain medication.  You can take this up to 3 times a day, once every 8 hours.  This is a low-dose prescribed.  If this is making you feel too sleepy, dizzy or sick, stop taking this medicine.  You can also take the Medrol Dosepak which was a steroid taper beginning tomorrow morning.  This can help with inflammation in your lower back.  Finally, for severe pain I did prescribe a short course of 10 tablets of oxycodone.  Oxycodone is a narcotic opioid medicine.  Long-term use of opioids has been linked to addiction and dependency issues.  Do not drive after taking this medication or operate heavy machinery or perform dangerous activities.  Do not take this medicine at the same time as gabapentin, as both of these can make you drowsy and dizzy.  Give yourself at least 6 hours between 1 medication in the next.

## 2023-11-05 MED ORDER — IBUPROFEN 800 MG PO TABS
800.0000 mg | ORAL_TABLET | Freq: Once | ORAL | Status: AC
  Administered 2023-11-05: 800 mg via ORAL
  Filled 2023-11-05: qty 1

## 2023-12-16 ENCOUNTER — Ambulatory Visit: Payer: PRIVATE HEALTH INSURANCE | Admitting: Family Medicine

## 2024-01-01 ENCOUNTER — Ambulatory Visit (INDEPENDENT_AMBULATORY_CARE_PROVIDER_SITE_OTHER): Payer: 59 | Admitting: Internal Medicine

## 2024-01-01 DIAGNOSIS — Z124 Encounter for screening for malignant neoplasm of cervix: Secondary | ICD-10-CM

## 2024-01-01 DIAGNOSIS — Z122 Encounter for screening for malignant neoplasm of respiratory organs: Secondary | ICD-10-CM

## 2024-01-01 DIAGNOSIS — Z1211 Encounter for screening for malignant neoplasm of colon: Secondary | ICD-10-CM | POA: Diagnosis not present

## 2024-01-01 DIAGNOSIS — Z23 Encounter for immunization: Secondary | ICD-10-CM | POA: Diagnosis not present

## 2024-01-01 DIAGNOSIS — Z1231 Encounter for screening mammogram for malignant neoplasm of breast: Secondary | ICD-10-CM | POA: Diagnosis not present

## 2024-01-01 DIAGNOSIS — F17210 Nicotine dependence, cigarettes, uncomplicated: Secondary | ICD-10-CM

## 2024-01-01 DIAGNOSIS — J4520 Mild intermittent asthma, uncomplicated: Secondary | ICD-10-CM | POA: Diagnosis not present

## 2024-01-01 LAB — TSH: TSH: 1.76 u[IU]/mL (ref 0.35–5.50)

## 2024-01-01 LAB — COMPREHENSIVE METABOLIC PANEL
ALT: 17 U/L (ref 0–35)
AST: 15 U/L (ref 0–37)
Albumin: 4 g/dL (ref 3.5–5.2)
Alkaline Phosphatase: 68 U/L (ref 39–117)
BUN: 12 mg/dL (ref 6–23)
CO2: 29 meq/L (ref 19–32)
Calcium: 9 mg/dL (ref 8.4–10.5)
Chloride: 104 meq/L (ref 96–112)
Creatinine, Ser: 0.67 mg/dL (ref 0.40–1.20)
GFR: 102.06 mL/min (ref >60.00)
Glucose, Bld: 131 mg/dL — ABNORMAL HIGH (ref 70–99)
Potassium: 3.7 meq/L (ref 3.5–5.1)
Sodium: 139 meq/L (ref 135–145)
Total Bilirubin: 0.4 mg/dL (ref 0.2–1.2)
Total Protein: 7 g/dL (ref 6.0–8.3)

## 2024-01-01 LAB — CBC WITH DIFFERENTIAL/PLATELET
Basophils Absolute: 0 10*3/uL (ref 0.0–0.1)
Basophils Relative: 0.6 % (ref 0.0–3.0)
Eosinophils Absolute: 0.1 10*3/uL (ref 0.0–0.7)
Eosinophils Relative: 2.5 % (ref 0.0–5.0)
HCT: 35.4 % — ABNORMAL LOW (ref 36.0–46.0)
Hemoglobin: 11.8 g/dL — ABNORMAL LOW (ref 12.0–15.0)
Lymphocytes Relative: 41.4 % (ref 12.0–46.0)
Lymphs Abs: 2.2 10*3/uL (ref 0.7–4.0)
MCHC: 33.2 g/dL (ref 30.0–36.0)
MCV: 87.3 fL (ref 78.0–100.0)
Monocytes Absolute: 0.4 10*3/uL (ref 0.1–1.0)
Monocytes Relative: 8 % (ref 3.0–12.0)
Neutro Abs: 2.6 10*3/uL (ref 1.4–7.7)
Neutrophils Relative %: 47.5 % (ref 43.0–77.0)
Platelets: 311 10*3/uL (ref 150.0–400.0)
RBC: 4.05 Mil/uL (ref 3.87–5.11)
RDW: 15.6 % — ABNORMAL HIGH (ref 11.5–15.5)
WBC: 5.4 10*3/uL (ref 4.0–10.5)

## 2024-01-01 LAB — LIPID PANEL
Cholesterol: 112 mg/dL (ref 0–200)
HDL: 37.5 mg/dL — ABNORMAL LOW (ref >39.00)
LDL Cholesterol: 56 mg/dL (ref 0–99)
NonHDL: 74.11
Total CHOL/HDL Ratio: 3
Triglycerides: 91 mg/dL (ref 0.0–149.0)
VLDL: 18.2 mg/dL (ref 0.0–40.0)

## 2024-01-01 LAB — VITAMIN B12: Vitamin B-12: 716 pg/mL (ref 211–911)

## 2024-01-01 LAB — HEMOGLOBIN A1C: Hgb A1c MFr Bld: 7.3 % — ABNORMAL HIGH (ref 4.6–6.5)

## 2024-01-01 LAB — VITAMIN D 25 HYDROXY (VIT D DEFICIENCY, FRACTURES): VITD: <7 ng/mL — ABNORMAL LOW (ref 30.00–100.00)

## 2024-01-01 NOTE — Assessment & Plan Note (Signed)
Well controlled on as needed albuterol

## 2024-01-01 NOTE — Progress Notes (Signed)
New Patient Office Visit     CC/Reason for Visit: Establish care, discuss chronic concerns Previous PCP: Unknown Last Visit: Unknown  HPI: Jasmine Heath is a 53 y.o. female who is coming in today for the above mentioned reasons. Past Medical History is significant for: Morbid obesity, mild intermittent asthma and possibly impaired glucose tolerance.  She has not had medical care in a long time.  She smokes a third of a pack a day and has done so for 20 years.  Many family members with asthma.  She is overdue for all age-appropriate cancer screening.  She is overdue for Tdap, shingles and flu vaccinations.   Past Medical/Surgical History: Past Medical History:  Diagnosis Date   Anemia    Asthma     Past Surgical History:  Procedure Laterality Date   NO PAST SURGERIES      Social History:  reports that she has been smoking cigarettes. She has never used smokeless tobacco. She reports that she does not currently use alcohol. She reports that she does not use drugs.  Allergies: No Known Allergies  Family History:  Family History  Problem Relation Age of Onset   Asthma Mother      Current Outpatient Medications:    amoxicillin-clavulanate (AUGMENTIN) 875-125 MG tablet, Take 1 tablet by mouth 2 (two) times daily., Disp: , Rfl:    gabapentin (NEURONTIN) 100 MG capsule, Take 100 mg by mouth 3 (three) times daily as needed., Disp: , Rfl:    meloxicam (MOBIC) 15 MG tablet, Take 15 mg by mouth daily as needed for pain., Disp: , Rfl:    albuterol (VENTOLIN HFA) 108 (90 Base) MCG/ACT inhaler, Inhale 1-2 puffs into the lungs every 6 (six) hours as needed for wheezing or shortness of breath. (Patient not taking: Reported on 01/01/2024), Disp: 8 g, Rfl: 2   budesonide-formoterol (SYMBICORT) 80-4.5 MCG/ACT inhaler, Inhale 2 puffs into the lungs in the morning and at bedtime. (Patient not taking: Reported on 01/01/2024), Disp: , Rfl:   Review of Systems:  Negative except as indicated  in HPI.   Physical Exam: Vitals:   01/01/24 1411  BP: 114/72  Pulse: 85  Resp: 18  Temp: 98.1 F (36.7 C)  TempSrc: Oral  SpO2: 96%  Weight: 289 lb 2 oz (131.1 kg)  Height: 5\' 6"  (1.676 m)   Body mass index is 46.67 kg/m.  Physical Exam Vitals reviewed.  Constitutional:      Appearance: Normal appearance. She is obese.  HENT:     Head: Normocephalic and atraumatic.  Eyes:     Conjunctiva/sclera: Conjunctivae normal.  Cardiovascular:     Rate and Rhythm: Normal rate and regular rhythm.  Pulmonary:     Effort: Pulmonary effort is normal.     Breath sounds: Normal breath sounds.  Skin:    General: Skin is warm and dry.  Neurological:     General: No focal deficit present.     Mental Status: She is alert and oriented to person, place, and time.  Psychiatric:        Mood and Affect: Mood normal.        Behavior: Behavior normal.        Thought Content: Thought content normal.        Judgment: Judgment normal.      Impression and Plan:  Morbid obesity (HCC) Assessment & Plan: -Discussed healthy lifestyle, including increased physical activity and better food choices to promote weight loss.   Orders: -  CBC with Differential/Platelet; Future -     Comprehensive metabolic panel; Future -     Hemoglobin A1c; Future -     Lipid panel; Future -     TSH; Future -     VITAMIN D 25 Hydroxy (Vit-D Deficiency, Fractures); Future -     Vitamin B12; Future  Need for Tdap vaccination -     Tdap vaccine greater than or equal to 7yo IM  Mild intermittent asthma without complication Assessment & Plan: Well-controlled on as needed albuterol.   Immunization due  Screening mammogram for breast cancer -     3D Screening Mammogram, Left and Right; Future  Screening for colon cancer -     Ambulatory referral to Gastroenterology  Screening for cervical cancer -     Ambulatory referral to Gynecology  Screening for lung cancer -     Ambulatory Referral for Lung  Cancer Scre  Cigarette nicotine dependence without complication Assessment & Plan: No time to discuss at today's visit.  Will continue to address at subsequent encounters.     Time spent: 48 minutes reviewing chart, interviewing and examining patient and formulating plan of care.        Chaya Jan, MD [facility] at [facility]

## 2024-01-01 NOTE — Progress Notes (Deleted)
     Established Patient Office Visit     CC/Reason for Visit: ***  HPI: Jasmine Heath is a 53 y.o. female who is coming in today for the above mentioned reasons. Past Medical History is significant for: ***   Past Medical/Surgical History: Past Medical History:  Diagnosis Date  . Anemia   . Asthma     Past Surgical History:  Procedure Laterality Date  . NO PAST SURGERIES      Social History:  reports that she has been smoking cigarettes. She has never used smokeless tobacco. She reports that she does not currently use alcohol. She reports that she does not use drugs.  Allergies: No Known Allergies  Family History: *** Family History  Problem Relation Age of Onset  . Asthma Mother      Current Outpatient Medications:  .  amoxicillin-clavulanate (AUGMENTIN) 875-125 MG tablet, Take 1 tablet by mouth 2 (two) times daily., Disp: , Rfl:  .  gabapentin (NEURONTIN) 100 MG capsule, Take 100 mg by mouth 3 (three) times daily as needed., Disp: , Rfl:  .  meloxicam (MOBIC) 15 MG tablet, Take 15 mg by mouth daily as needed for pain., Disp: , Rfl:  .  albuterol (VENTOLIN HFA) 108 (90 Base) MCG/ACT inhaler, Inhale 1-2 puffs into the lungs every 6 (six) hours as needed for wheezing or shortness of breath. (Patient not taking: Reported on 01/01/2024), Disp: 8 g, Rfl: 2 .  budesonide-formoterol (SYMBICORT) 80-4.5 MCG/ACT inhaler, Inhale 2 puffs into the lungs in the morning and at bedtime. (Patient not taking: Reported on 01/01/2024), Disp: , Rfl:   Review of Systems:  Negative unless indicated in HPI.   Physical Exam: Vitals:   01/01/24 1411  BP: 114/72  Pulse: 85  Resp: 18  Temp: 98.1 F (36.7 C)  TempSrc: Oral  SpO2: 96%  Weight: 289 lb 2 oz (131.1 kg)  Height: 5\' 6"  (1.676 m)    Body mass index is 46.67 kg/m.   Physical Exam   Impression and Plan:  Morbid obesity (HCC) -     CBC with Differential/Platelet; Future -     Comprehensive metabolic panel;  Future -     Hemoglobin A1c; Future -     Lipid panel; Future -     TSH; Future -     VITAMIN D 25 Hydroxy (Vit-D Deficiency, Fractures); Future -     Vitamin B12; Future  Need for Tdap vaccination -     Tdap vaccine greater than or equal to 7yo IM  Mild intermittent asthma without complication  Immunization due  Screening mammogram for breast cancer -     3D Screening Mammogram, Left and Right; Future  Screening for colon cancer -     Ambulatory referral to Gastroenterology  Screening for cervical cancer -     Ambulatory referral to Gynecology  Screening for lung cancer -     Ambulatory Referral for Lung Cancer Scre  Cigarette nicotine dependence without complication     Time spent:*** minutes reviewing chart, interviewing and examining patient and formulating plan of care.     Chaya Jan, MD [facility] at [facility]

## 2024-01-01 NOTE — Assessment & Plan Note (Signed)
Discussed healthy lifestyle, including increased physical activity and better food choices to promote weight loss.

## 2024-01-01 NOTE — Assessment & Plan Note (Signed)
No time to discuss at today's visit.  Will continue to address at subsequent encounters.

## 2024-01-05 ENCOUNTER — Encounter: Payer: Self-pay | Admitting: Internal Medicine

## 2024-01-05 ENCOUNTER — Other Ambulatory Visit: Payer: Self-pay | Admitting: Internal Medicine

## 2024-01-05 DIAGNOSIS — E119 Type 2 diabetes mellitus without complications: Secondary | ICD-10-CM | POA: Insufficient documentation

## 2024-01-05 DIAGNOSIS — E1169 Type 2 diabetes mellitus with other specified complication: Secondary | ICD-10-CM

## 2024-01-05 DIAGNOSIS — D649 Anemia, unspecified: Secondary | ICD-10-CM

## 2024-01-05 DIAGNOSIS — E559 Vitamin D deficiency, unspecified: Secondary | ICD-10-CM | POA: Insufficient documentation

## 2024-01-05 MED ORDER — VITAMIN D (ERGOCALCIFEROL) 1.25 MG (50000 UNIT) PO CAPS: 50000.0000 [IU] | ORAL_CAPSULE | ORAL | 0 refills | Status: AC

## 2024-01-05 MED ORDER — METFORMIN HCL 500 MG PO TABS: 500.0000 mg | ORAL_TABLET | Freq: Two times a day (BID) | ORAL | 0 refills | Status: AC

## 2024-01-06 ENCOUNTER — Other Ambulatory Visit: Payer: Self-pay | Admitting: *Deleted

## 2024-01-06 DIAGNOSIS — E1169 Type 2 diabetes mellitus with other specified complication: Secondary | ICD-10-CM

## 2024-01-11 ENCOUNTER — Emergency Department (HOSPITAL_COMMUNITY)
Admission: EM | Admit: 2024-01-11 | Discharge: 2024-01-11 | Disposition: A | Discharge status: Home / Self Care | Payer: 59 | Attending: Emergency Medicine | Admitting: Emergency Medicine

## 2024-01-11 ENCOUNTER — Other Ambulatory Visit: Payer: Self-pay

## 2024-01-11 DIAGNOSIS — E119 Type 2 diabetes mellitus without complications: Secondary | ICD-10-CM | POA: Insufficient documentation

## 2024-01-11 DIAGNOSIS — Z7984 Long term (current) use of oral hypoglycemic drugs: Secondary | ICD-10-CM | POA: Insufficient documentation

## 2024-01-11 DIAGNOSIS — K047 Periapical abscess without sinus: Secondary | ICD-10-CM | POA: Diagnosis present

## 2024-01-11 MED ORDER — IBUPROFEN 800 MG PO TABS
800.0000 mg | ORAL_TABLET | Freq: Once | ORAL | Status: AC
  Administered 2024-01-11: 800 mg via ORAL
  Filled 2024-01-11: qty 1

## 2024-01-11 MED ORDER — IBUPROFEN 800 MG PO TABS: 800.0000 mg | ORAL_TABLET | Freq: Three times a day (TID) | ORAL | 0 refills | Status: DC

## 2024-01-11 MED ORDER — AMOXICILLIN-POT CLAVULANATE 875-125 MG PO TABS: 1.0000 | ORAL_TABLET | Freq: Two times a day (BID) | ORAL | 0 refills | Status: DC

## 2024-01-11 MED ORDER — AMOXICILLIN-POT CLAVULANATE 875-125 MG PO TABS
1.0000 | ORAL_TABLET | Freq: Once | ORAL | Status: AC
  Administered 2024-01-11: 1 via ORAL
  Filled 2024-01-11: qty 1

## 2024-01-11 NOTE — ED Triage Notes (Signed)
 Pt arrived via POV. C/o dental pain and swelling in R lower jaw for 3x days

## 2024-01-11 NOTE — ED Provider Notes (Signed)
 Jasmine Heath AT [facility] Provider Note   CSN: [id number] Arrival date & time: 01/11/24  1538     History  Chief Complaint  Patient presents with   Oral Swelling    Jasmine Heath is a 53 y.o. female history of poor dentition, diabetes here presenting with lower jaw swelling.  Patient states that about 2 weeks ago she had a dental infection and finished a course of antibiotics.  She states that for the last 3 days, she noticed swelling of the lower jaw.  She states that she has worsening dental pain as well.  Denies trouble swallowing and denies any fever  The history is provided by the patient.       Home Medications Prior to Admission medications   Medication Sig Start Date End Date Taking? Authorizing Provider  albuterol  (VENTOLIN  HFA) 108 (90 Base) MCG/ACT inhaler Inhale 1-2 puffs into the lungs every 6 (six) hours as needed for wheezing or shortness of breath. Patient not taking: Reported on 01/01/2024 04/10/21   Joldersma, Logan, PA-C  amoxicillin -clavulanate (AUGMENTIN ) 875-125 MG tablet Take 1 tablet by mouth 2 (two) times daily. 12/18/23   [provider]  budesonide-formoterol (SYMBICORT) 80-4.5 MCG/ACT inhaler Inhale 2 puffs into the lungs in the morning and at bedtime. Patient not taking: Reported on 01/01/2024 08/15/23 08/14/24  [provider]  gabapentin  (NEURONTIN ) 100 MG capsule Take 100 mg by mouth 3 (three) times daily as needed.    [provider]  meloxicam  (MOBIC ) 15 MG tablet Take 15 mg by mouth daily as needed for pain. 08/15/23   [provider]  metFORMIN  (GLUCOPHAGE ) 500 MG tablet Take 1 tablet (500 mg total) by mouth 2 (two) times daily with a meal. 01/05/24   Theophilus Andrews, Jasmine GRADE, MD  Vitamin D , Ergocalciferol , (DRISDOL ) 1.25 MG (50000 UNIT) CAPS capsule Take 1 capsule (50,000 Units total) by mouth every 7 (seven) days for 12 doses. 01/05/24 03/23/24  Theophilus Andrews Jasmine GRADE, MD       Allergies    Patient has no known allergies.    Review of Systems   Review of Systems  HENT:  Positive for dental problem.   All other systems reviewed and are negative.   Physical Exam Updated Vital Signs BP (!) 141/96   Pulse 90   Temp 98.5 F (36.9 C) (Oral)   Resp 18   LMP 12/02/2023 (Approximate)   SpO2 93%  Physical Exam Vitals and nursing note reviewed.  HENT:     Head: Normocephalic.     Mouth/Throat:      Comments: Patient has poor dentition overall.  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  Patient floor mouth is soft and nontender.  No evidence of Ludwig angina Cardiovascular:     Rate and Rhythm: Normal rate.     Pulses: Normal pulses.  Pulmonary:     Effort: Pulmonary effort is normal.     Breath sounds: Normal breath sounds.  Abdominal:     General: Abdomen is flat.     Palpations: Abdomen is soft.  Musculoskeletal:        General: Normal range of motion.     Cervical back: Normal range of motion and neck supple.  Skin:    General: Skin is warm.     Capillary Refill: Capillary refill takes less than 2 seconds.  Neurological:     General: No focal deficit present.     Mental Status:  She is alert.  Psychiatric:        Mood and Affect: Mood normal.     ED Results / Procedures / Treatments   Labs (all labs ordered are listed, but only abnormal results are displayed) Labs Reviewed - No data to display  EKG None  Radiology No results found.  Procedures Procedures    Medications Ordered in ED Medications  amoxicillin -clavulanate (AUGMENTIN ) 875-125 MG per tablet 1 tablet (has no administration in time range)  ibuprofen  (ADVIL ) tablet 800 mg (has no administration in time range)    ED Course/ Medical Decision Making/ A&P                                 Medical Decision Making [name] is a 53 y.o. female here presenting with dental pain.  Patient has poor dentition and has removed  right of the right molar.  Patient has some swelling around it.  She may have a early periapical abscess versus just inflammation.  Patient has no evidence of Ludewig angina.  Will trial another course of Augmentin .  I told her to call dentist tomorrow to get follow-up   Problems Addressed: Dental infection: acute illness or injury  Risk Prescription drug management.    Final Clinical Impression(s) / ED Diagnoses Final diagnoses:  None    Rx / DC Orders ED Discharge Orders     None         Patt Alm Macho, MD 01/11/24 2012

## 2024-01-11 NOTE — Discharge Instructions (Addendum)
 You have a dental infection. Take augmentin  twice daily for a week   Take motrin  for pain   Call dentist tomorrow for follow up   Return to ER if you have worse dental pain, jaw swelling, trouble swallowing

## 2024-01-23 ENCOUNTER — Other Ambulatory Visit: Payer: 59

## 2024-01-27 ENCOUNTER — Ambulatory Visit (HOSPITAL_BASED_OUTPATIENT_CLINIC_OR_DEPARTMENT_OTHER): Admission: RE | Admit: 2024-01-27 | Payer: 59 | Source: Ambulatory Visit | Admitting: Radiology

## 2024-02-02 ENCOUNTER — Ambulatory Visit: Payer: 59 | Admitting: Dietician

## 2024-02-04 ENCOUNTER — Ambulatory Visit: Payer: 59 | Admitting: Internal Medicine

## 2024-02-13 ENCOUNTER — Encounter (HOSPITAL_BASED_OUTPATIENT_CLINIC_OR_DEPARTMENT_OTHER): Payer: Self-pay

## 2024-02-13 ENCOUNTER — Inpatient Hospital Stay (HOSPITAL_BASED_OUTPATIENT_CLINIC_OR_DEPARTMENT_OTHER): Admission: RE | Admit: 2024-02-13 | Payer: 59 | Source: Ambulatory Visit | Admitting: Radiology

## 2024-02-16 ENCOUNTER — Telehealth: Admitting: Nurse Practitioner

## 2024-02-16 DIAGNOSIS — K047 Periapical abscess without sinus: Secondary | ICD-10-CM

## 2024-02-16 MED ORDER — PENICILLIN V POTASSIUM 500 MG PO TABS: 500.0000 mg | ORAL_TABLET | Freq: Three times a day (TID) | ORAL | 0 refills | Status: AC

## 2024-02-16 NOTE — Progress Notes (Signed)
 Virtual Visit Consent   Jasmine Heath, you are scheduled for a virtual visit with a [facility] provider today. Just as with appointments in the office, your consent must be obtained to participate. Your consent will be active for this visit and any virtual visit you may have with one of our providers in the next 365 days. If you have a MyChart account, a copy of this consent can be sent to you electronically.  As this is a virtual visit, video technology does not allow for your provider to perform a traditional examination. This may limit your provider's ability to fully assess your condition. If your provider identifies any concerns that need to be evaluated in person or the need to arrange testing (such as labs, EKG, etc.), we will make arrangements to do so. Although advances in technology are sophisticated, we cannot ensure that it will always work on either your end or our end. If the connection with a video visit is poor, the visit may have to be switched to a telephone visit. With either a video or telephone visit, we are not always able to ensure that we have a secure connection.  By engaging in this virtual visit, you consent to the provision of healthcare and authorize for your insurance to be billed (if applicable) for the services provided during this visit. Depending on your insurance coverage, you may receive a charge related to this service.  I need to obtain your verbal consent now. Are you willing to proceed with your visit today? Winna Golla has provided verbal consent on 02/16/2024 for a virtual visit (video or telephone). Viviano Simas, FNP  Date: 02/16/2024 3:22 PM   Virtual Visit via Video Note   I, Viviano Simas, connected with  Jasmine Heath  ([id number], Dec 01, 1971) on 02/16/24 at  3:30 PM EDT by a video-enabled telemedicine application and verified that I am speaking with the correct person using two identifiers.  Location: Patient: Virtual Visit Location Patient:  Home Provider: Virtual Visit Location Provider: Home Office   I discussed the limitations of evaluation and management by telemedicine and the availability of in person appointments. The patient expressed understanding and agreed to proceed.    History of Present Illness: Jasmine Heath is a 53 y.o. who identifies as a female who was assigned female at birth, and is being seen today for ongoing dental pain   She has on two round of antibiotics in the last year  Most recent was February with Augmentin   She is seeing a dentist in 2 weeks for extraction   She denies fever, just has pain today  Today her most painful tooth is the right bottom    Problems:  Patient Active Problem List   Diagnosis Date Noted   Vitamin D deficiency 01/05/2024   DM (diabetes mellitus), type 2 (HCC) 01/05/2024   Mild intermittent asthma without complication 01/01/2024   Cigarette nicotine dependence without complication 01/01/2024   Acute respiratory failure due to COVID-19 (HCC) 11/29/2020   Anemia 11/15/2012   Pain due to dental caries 11/15/2012   SOB (shortness of breath) 11/14/2012   Morbid obesity (HCC) 11/14/2012   Hypokalemia 11/14/2012    Allergies: No Known Allergies Medications:  Current Outpatient Medications:    albuterol (VENTOLIN HFA) 108 (90 Base) MCG/ACT inhaler, Inhale 1-2 puffs into the lungs every 6 (six) hours as needed for wheezing or shortness of breath. (Patient not taking: Reported on 01/01/2024), Disp: 8 g, Rfl: 2   amoxicillin-clavulanate (AUGMENTIN) 875-125 MG tablet,  Take 1 tablet by mouth every 12 (twelve) hours., Disp: 14 tablet, Rfl: 0   budesonide-formoterol (SYMBICORT) 80-4.5 MCG/ACT inhaler, Inhale 2 puffs into the lungs in the morning and at bedtime. (Patient not taking: Reported on 01/01/2024), Disp: , Rfl:    gabapentin (NEURONTIN) 100 MG capsule, Take 100 mg by mouth 3 (three) times daily as needed., Disp: , Rfl:    ibuprofen (ADVIL) 800 MG tablet, Take 1 tablet  (800 mg total) by mouth 3 (three) times daily., Disp: 21 tablet, Rfl: 0   meloxicam (MOBIC) 15 MG tablet, Take 15 mg by mouth daily as needed for pain., Disp: , Rfl:    metFORMIN (GLUCOPHAGE) 500 MG tablet, Take 1 tablet (500 mg total) by mouth 2 (two) times daily with a meal., Disp: 180 tablet, Rfl: 0   Vitamin D, Ergocalciferol, (DRISDOL) 1.25 MG (50000 UNIT) CAPS capsule, Take 1 capsule (50,000 Units total) by mouth every 7 (seven) days for 12 doses., Disp: 12 capsule, Rfl: 0  Observations/Objective: Patient is well-developed, well-nourished in no acute distress.  Resting comfortably  at home.  Head is normocephalic, atraumatic.  No labored breathing.  Speech is clear and coherent with logical content.  Patient is alert and oriented at baseline.    Assessment and Plan:  1. Dental infection (Primary) Follow up with dentist as planned     - penicillin v potassium (VEETID) 500 MG tablet; Take 1 tablet (500 mg total) by mouth 3 (three) times daily for 10 days.  Dispense: 30 tablet; Refill: 0     Follow Up Instructions: I discussed the assessment and treatment plan with the patient. The patient was provided an opportunity to ask questions and all were answered. The patient agreed with the plan and demonstrated an understanding of the instructions.  A copy of instructions were sent to the patient via MyChart unless otherwise noted below.     The patient was advised to call back or seek an in-person evaluation if the symptoms worsen or if the condition fails to improve as anticipated.    Viviano Simas, FNP

## 2024-03-08 ENCOUNTER — Encounter: Payer: 59 | Admitting: Obstetrics and Gynecology

## 2024-04-05 ENCOUNTER — Ambulatory Visit: Payer: 59 | Admitting: Internal Medicine

## 2024-04-05 DIAGNOSIS — E1169 Type 2 diabetes mellitus with other specified complication: Secondary | ICD-10-CM

## 2024-04-21 ENCOUNTER — Encounter: Payer: Self-pay | Admitting: Obstetrics and Gynecology

## 2024-08-27 ENCOUNTER — Telehealth: Payer: Self-pay | Admitting: Physician Assistant

## 2024-08-27 DIAGNOSIS — K047 Periapical abscess without sinus: Secondary | ICD-10-CM

## 2024-08-27 MED ORDER — NAPROXEN 500 MG PO TABS: 500.0000 mg | ORAL_TABLET | Freq: Two times a day (BID) | ORAL | 0 refills | Status: AC

## 2024-08-27 MED ORDER — AMOXICILLIN-POT CLAVULANATE 875-125 MG PO TABS: 1.0000 | ORAL_TABLET | Freq: Two times a day (BID) | ORAL | 0 refills | Status: AC

## 2024-08-27 NOTE — Patient Instructions (Signed)
 Jasmine Heath, thank you for joining Elsie Velma Lunger, PA-C for today's virtual visit.  While this provider is not your primary care provider (PCP), if your PCP is located in our provider database this encounter information will be shared with them immediately following your visit.   A [place] MyChart account gives you access to today's visit and all your visits, tests, and labs performed at [facility]  click here if you don't have a [place] MyChart account or go to [email or web address]  Consent: (Patient) Jasmine Heath provided verbal consent for this virtual visit at the beginning of the encounter.  Current Medications:  Current Outpatient Medications:    albuterol  (VENTOLIN  HFA) 108 (90 Base) MCG/ACT inhaler, Inhale 1-2 puffs into the lungs every 6 (six) hours as needed for wheezing or shortness of breath. (Patient not taking: Reported on 01/01/2024), Disp: 8 g, Rfl: 2   budesonide-formoterol (SYMBICORT) 80-4.5 MCG/ACT inhaler, Inhale 2 puffs into the lungs in the morning and at bedtime. (Patient not taking: Reported on 01/01/2024), Disp: , Rfl:    gabapentin  (NEURONTIN ) 100 MG capsule, Take 100 mg by mouth 3 (three) times daily as needed., Disp: , Rfl:    ibuprofen  (ADVIL ) 800 MG tablet, Take 1 tablet (800 mg total) by mouth 3 (three) times daily., Disp: 21 tablet, Rfl: 0   meloxicam  (MOBIC ) 15 MG tablet, Take 15 mg by mouth daily as needed for pain., Disp: , Rfl:    metFORMIN  (GLUCOPHAGE ) 500 MG tablet, Take 1 tablet (500 mg total) by mouth 2 (two) times daily with a meal., Disp: 180 tablet, Rfl: 0   Medications ordered in this encounter:  No orders of the defined types were placed in this encounter.    *If you need refills on other medications prior to your next appointment, please contact your pharmacy*  Follow-Up: Call back or seek an in-person evaluation if the symptoms worsen or if the condition fails to improve as anticipated.  [place]  Virtual Care [phone]  Other Instructions Dental Abscess  A dental abscess is an area of pus in or around a tooth. It comes from an infection. It can cause pain and other symptoms. Treatment will help with symptoms and prevent the infection from spreading. What are the causes? This condition is caused by an infection in or around the tooth. This can be from: Very bad tooth decay (cavities). A bad injury to the tooth, such as a broken or chipped tooth. What increases the risk? The risk to get an abscess is higher in males. It is also more likely in people who: Have dental decay. Have very bad gum disease. Eat sugary snacks between meals. Use tobacco. Have diabetes. Have a weak disease-fighting system (immune system). Do not brush their teeth regularly. What are the signs or symptoms? Some mild symptoms are: Tenderness. Bad breath. Fever. A sharp, sour taste in the mouth. Pain in and around the infected tooth. Worse symptoms of this condition include: Swollen neck glands. Chills. Pus draining around the tooth. Swelling and redness around the tooth, the mouth, or the face. Very bad pain in and around the tooth. The worst symptoms can include: Difficulty swallowing. Difficulty opening your mouth. Feeling like you may vomit or vomiting. How is this treated? This is treated by getting rid of the infection. Your dentist will discuss ways to do this, including: Antibiotic medicines. Antibacterial mouth rinse. An incision in the abscess to drain out the pus. A root canal. Removing the tooth. Follow these instructions  at home: Medicines Take over-the-counter and prescription medicines only as told by your dentist. If you were prescribed an antibiotic medicine, take it as told by your dentist. Do not stop taking it even if you start to feel better. If you were prescribed a gel that has numbing medicine in it, use it exactly as told. Ask your dentist if you should avoid  driving or using machines while you are taking your medicine. General instructions Rinse your mouth often with salt water. To make salt water, dissolve -1 tsp (3-6 g) of salt in 1 cup (237 mL) of warm water. Eat a soft diet while your mouth is healing. Drink enough fluid to keep your pee (urine) pale yellow. Do not apply heat to the outside of your mouth. Do not smoke or use any products that contain nicotine or tobacco. If you need help quitting, ask your dentist. Keep all follow-up visits. Prevent an abscess Brush your teeth every morning and every night. Use fluoride toothpaste. Floss your teeth each day. Get dental cleanings as often as told by your dentist. Think about getting dental sealant put on teeth that have deep holes (decay). Drink water that has fluoride in it. Most tap water has fluoride. Check the label on bottled water to see if it has fluoride in it. Drink water instead of sugary drinks. Eat healthy meals and snacks. Wear a mouth guard or face shield when you play sports. Contact a doctor if: Your pain is worse and medicine does not help. Get help right away if: You have a fever or chills. Your symptoms suddenly get worse. You have a very bad headache. You have problems breathing or swallowing. You have trouble opening your mouth. You have swelling in your neck or close to your eye. These symptoms may be an emergency. Get help right away. Call your local emergency services (911 in the [place] Do not wait to see if the symptoms will go away. Do not drive yourself to the hospital. Summary A dental abscess is an area of pus in or around a tooth. It is caused by an infection. Treatment will help with symptoms and prevent the infection from spreading. Take over-the-counter and prescription medicines only as told by your dentist. To prevent an abscess, take good care of your teeth. Brush your teeth every morning and night. Use floss every day. Get dental cleanings as  often as told by your dentist. This information is not intended to replace advice given to you by your health care provider. Make sure you discuss any questions you have with your health care provider. Document Revised: 01/24/2021 Document Reviewed: 01/25/2021 Elsevier Patient Education  2024 [employer]   If you have been instructed to have an in-person evaluation today at a local Urgent Care facility, please use the link below. It will take you to a list of all of our available [facility], including address, phone number and hours of operation. Please do not delay care.  [facility]  If you or a family member do not have a primary care provider, use the link below to schedule a visit and establish care. When you choose a [facility] primary care physician or advanced practice provider, you gain a long-term partner in health. Find a Primary Care Provider  Learn more about [facility]'s in-office and virtual care options: [place] - Get Care Now

## 2024-08-27 NOTE — Progress Notes (Signed)
 Virtual Visit Consent   Jasmine Heath, you are scheduled for a virtual visit with a [facility] provider today. Just as with appointments in the office, your consent must be obtained to participate. Your consent will be active for this visit and any virtual visit you may have with one of our providers in the next 365 days. If you have a MyChart account, a copy of this consent can be sent to you electronically.  As this is a virtual visit, video technology does not allow for your provider to perform a traditional examination. This may limit your provider's ability to fully assess your condition. If your provider identifies any concerns that need to be evaluated in person or the need to arrange testing (such as labs, EKG, etc.), we will make arrangements to do so. Although advances in technology are sophisticated, we cannot ensure that it will always work on either your end or our end. If the connection with a video visit is poor, the visit may have to be switched to a telephone visit. With either a video or telephone visit, we are not always able to ensure that we have a secure connection.  By engaging in this virtual visit, you consent to the provision of healthcare and authorize for your insurance to be billed (if applicable) for the services provided during this visit. Depending on your insurance coverage, you may receive a charge related to this service.  I need to obtain your verbal consent now. Are you willing to proceed with your visit today? Jasmine Heath has provided verbal consent on 08/27/2024 for a virtual visit (video or telephone). Jasmine Heath, [place]  Date: 08/27/2024 3:57 PM   Virtual Visit via Video Note   I, Jasmine Heath, connected with  Jasmine Heath  ([id number], [birth year]) on 08/27/24 at  4:00 PM EDT by a video-enabled telemedicine application and verified that I am speaking with the correct person using two identifiers.  Location: Patient: Virtual Visit  Location Patient: Home Provider: Virtual Visit Location Provider: Home Office   I discussed the limitations of evaluation and management by telemedicine and the availability of in person appointments. The patient expressed understanding and agreed to proceed.    History of Present Illness: Jasmine Heath is a 53 y.o. who identifies as a female who was assigned female at birth, and is being seen today for pain of left lower front incisor over the past week, initially improving but worsening again over past few days. Notes pain and associated swelling around the base of the tooth. Notes history of poor dentition and prior dental abscess, most recently 6 months ago requiring extraction of 2 teeth. Denies fever, chills. Has not taken anything OTC for pain so far. SABRA   HPI: HPI  Problems:  Patient Active Problem List   Diagnosis Date Noted   Vitamin D  deficiency 01/05/2024   DM (diabetes mellitus), type 2 (HCC) 01/05/2024   Mild intermittent asthma without complication 01/01/2024   Cigarette nicotine dependence without complication 01/01/2024   Acute respiratory failure due to COVID-19 [facility]) 11/29/2020   Anemia 11/15/2012   Pain due to dental caries 11/15/2012   SOB (shortness of breath) 11/14/2012   Morbid obesity (HCC) 11/14/2012   Hypokalemia 11/14/2012    Allergies: No Known Allergies Medications:  Current Outpatient Medications:    amoxicillin -clavulanate (AUGMENTIN ) 875-125 MG tablet, Take 1 tablet by mouth 2 (two) times daily., Disp: 14 tablet, Rfl: 0   naproxen  (NAPROSYN ) 500 MG tablet, Take 1 tablet (500 mg total)  by mouth 2 (two) times daily with a meal., Disp: 20 tablet, Rfl: 0   albuterol  (VENTOLIN  HFA) 108 (90 Base) MCG/ACT inhaler, Inhale 1-2 puffs into the lungs every 6 (six) hours as needed for wheezing or shortness of breath. (Patient not taking: Reported on 01/01/2024), Disp: 8 g, Rfl: 2   budesonide-formoterol (SYMBICORT) 80-4.5 MCG/ACT inhaler, Inhale 2 puffs into the  lungs in the morning and at bedtime. (Patient not taking: Reported on 01/01/2024), Disp: , Rfl:    gabapentin  (NEURONTIN ) 100 MG capsule, Take 100 mg by mouth 3 (three) times daily as needed., Disp: , Rfl:    metFORMIN  (GLUCOPHAGE ) 500 MG tablet, Take 1 tablet (500 mg total) by mouth 2 (two) times daily with a meal., Disp: 180 tablet, Rfl: 0  Observations/Objective: Patient is well-developed, well-nourished in no acute distress.  Resting comfortably at home.  Head is normocephalic, atraumatic.  No labored breathing. Speech is clear and coherent with logical content.  Patient is alert and oriented at baseline.   Assessment and Plan: 1. Dental infection (Primary) - naproxen  (NAPROSYN ) 500 MG tablet; Take 1 tablet (500 mg total) by mouth 2 (two) times daily with a meal.  Dispense: 20 tablet; Refill: 0 - amoxicillin -clavulanate (AUGMENTIN ) 875-125 MG tablet; Take 1 tablet by mouth 2 (two) times daily.  Dispense: 14 tablet; Refill: 0  Supportive measures and OTC medications reviewed. Prior issue with dental abscess requiring tooth extraction. Concern she will need the same here as well due to poor dentition and risk of recurring abscess. Start Augmentin  per orders. Naprosyn  per orders. Dental follow-up scheduled.   Follow Up Instructions: I discussed the assessment and treatment plan with the patient. The patient was provided an opportunity to ask questions and all were answered. The patient agreed with the plan and demonstrated an understanding of the instructions.  A copy of instructions were sent to the patient via MyChart unless otherwise noted below.   The patient was advised to call back or seek an in-person evaluation if the symptoms worsen or if the condition fails to improve as anticipated.    Jasmine Velma Lunger, PA-C
